# Patient Record
Sex: Female | Born: 1980 | Race: Black or African American | Hispanic: No | Marital: Single | Smoking: Current some day smoker
Health system: Southern US, Community
[De-identification: ages and names within clinical notes are randomized; demographics above are authoritative.]

## PROBLEM LIST (undated history)

## (undated) DIAGNOSIS — E039 Hypothyroidism, unspecified: Secondary | ICD-10-CM

## (undated) DIAGNOSIS — B009 Herpesviral infection, unspecified: Secondary | ICD-10-CM

## (undated) DIAGNOSIS — R87629 Unspecified abnormal cytological findings in specimens from vagina: Secondary | ICD-10-CM

## (undated) DIAGNOSIS — K859 Acute pancreatitis without necrosis or infection, unspecified: Secondary | ICD-10-CM

## (undated) DIAGNOSIS — I10 Essential (primary) hypertension: Secondary | ICD-10-CM

## (undated) DIAGNOSIS — D219 Benign neoplasm of connective and other soft tissue, unspecified: Secondary | ICD-10-CM

## (undated) DIAGNOSIS — M1990 Unspecified osteoarthritis, unspecified site: Secondary | ICD-10-CM

## (undated) HISTORY — DX: Herpesviral infection, unspecified: B00.9

## (undated) HISTORY — DX: Unspecified abnormal cytological findings in specimens from vagina: R87.629

## (undated) HISTORY — DX: Benign neoplasm of connective and other soft tissue, unspecified: D21.9

## (undated) HISTORY — PX: TONSILLECTOMY: SUR1361

## (undated) HISTORY — PX: KNEE SURGERY: SHX244

---

## 2001-12-21 ENCOUNTER — Encounter: Payer: Self-pay | Admitting: Emergency Medicine

## 2001-12-21 ENCOUNTER — Emergency Department (HOSPITAL_COMMUNITY): Admission: EM | Admit: 2001-12-21 | Discharge: 2001-12-21 | Payer: Self-pay | Admitting: Internal Medicine

## 2002-01-02 ENCOUNTER — Encounter (HOSPITAL_COMMUNITY): Admission: RE | Admit: 2002-01-02 | Discharge: 2002-02-01 | Payer: Self-pay | Admitting: Preventative Medicine

## 2002-07-03 ENCOUNTER — Emergency Department (HOSPITAL_COMMUNITY): Admission: EM | Admit: 2002-07-03 | Discharge: 2002-07-03 | Payer: Self-pay | Admitting: Emergency Medicine

## 2008-07-22 ENCOUNTER — Other Ambulatory Visit: Admission: RE | Admit: 2008-07-22 | Discharge: 2008-07-22 | Payer: Self-pay

## 2008-07-22 ENCOUNTER — Other Ambulatory Visit: Admission: RE | Admit: 2008-07-22 | Discharge: 2008-07-22 | Payer: Self-pay | Admitting: Family Medicine

## 2008-07-22 ENCOUNTER — Encounter (INDEPENDENT_AMBULATORY_CARE_PROVIDER_SITE_OTHER): Payer: Self-pay | Admitting: Unknown Physician Specialty

## 2013-07-08 ENCOUNTER — Ambulatory Visit: Payer: Self-pay | Admitting: Obstetrics and Gynecology

## 2015-12-08 ENCOUNTER — Emergency Department (HOSPITAL_COMMUNITY)
Admission: EM | Admit: 2015-12-08 | Discharge: 2015-12-08 | Disposition: A | Discharge status: Home / Self Care | Payer: Self-pay | Attending: Emergency Medicine | Admitting: Emergency Medicine

## 2015-12-08 ENCOUNTER — Encounter (HOSPITAL_COMMUNITY): Payer: Self-pay

## 2015-12-08 ENCOUNTER — Emergency Department (HOSPITAL_COMMUNITY): Payer: Self-pay

## 2015-12-08 DIAGNOSIS — Y9389 Activity, other specified: Secondary | ICD-10-CM | POA: Insufficient documentation

## 2015-12-08 DIAGNOSIS — Y990 Civilian activity done for income or pay: Secondary | ICD-10-CM | POA: Insufficient documentation

## 2015-12-08 DIAGNOSIS — X501XXA Overexertion from prolonged static or awkward postures, initial encounter: Secondary | ICD-10-CM | POA: Insufficient documentation

## 2015-12-08 DIAGNOSIS — I10 Essential (primary) hypertension: Secondary | ICD-10-CM | POA: Insufficient documentation

## 2015-12-08 DIAGNOSIS — Y9261 Building [any] under construction as the place of occurrence of the external cause: Secondary | ICD-10-CM | POA: Insufficient documentation

## 2015-12-08 DIAGNOSIS — S86812A Strain of other muscle(s) and tendon(s) at lower leg level, left leg, initial encounter: Secondary | ICD-10-CM | POA: Insufficient documentation

## 2015-12-08 DIAGNOSIS — S86912A Strain of unspecified muscle(s) and tendon(s) at lower leg level, left leg, initial encounter: Secondary | ICD-10-CM

## 2015-12-08 DIAGNOSIS — F17200 Nicotine dependence, unspecified, uncomplicated: Secondary | ICD-10-CM | POA: Insufficient documentation

## 2015-12-08 HISTORY — DX: Essential (primary) hypertension: I10

## 2015-12-08 NOTE — Discharge Instructions (Signed)
Ice, ace wrap, tylenol and motrin as needed.  If you were given medicines take as directed.  If you are on coumadin or contraceptives realize their levels and effectiveness is altered by many different medicines.  If you have any reaction (rash, tongues swelling, other) to the medicines stop taking and see a physician.    If your blood pressure was elevated in the ER make sure you follow up for management with a primary doctor or return for chest pain, shortness of breath or stroke symptoms.  Please follow up as directed and return to the ER or see a physician for new or worsening symptoms.  Thank you. Vitals:   12/08/15 1001 12/08/15 1003  BP:  (!) 162/114  Pulse:  62  Resp:  20  Temp:  98.7 F (37.1 C)  TempSrc:  Oral  SpO2:  100%  Weight: 221 lb (100.2 kg)   Height: 5\' 1"  (1.549 m)

## 2015-12-08 NOTE — ED Triage Notes (Signed)
Pt reports left knee started hurting while loading a machine at work on Sat.  Reports history of torn acl in left knee.

## 2015-12-08 NOTE — ED Provider Notes (Signed)
[facility]EMER[facility]ENCY DEPT Provider Note   CSN: [id number] Arrival date & time: 12/08/15  [id number]  By signing my name below, I, Soijett Blue, attest that this documentation has been prepared under the direction and in the presence of Blane OharaJoshua Anavi Branscum, MD. Electronically Signed: Soijett Blue, ED Scribe. 12/08/15. 10:43 AM.   History   Chief Complaint Chief Complaint  Patient presents with  . Knee Pain    HPI Renee [name] is a 35 y.o. female with a medical hx of HTN who presents to the Emergency Department complaining of left knee pain onset 3 days ago. 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 Pt reports that she was unable to straighten her leg initially, but she is able to do so now. Pt states that she tore her ACL when she was 35 years old and she had the area reconstructed. Pt denies having an orthopedist at this time. Pt is having associated symptoms of gait problem due to pain. She notes that she has tried ibuprofen for the relief of her symptoms. She denies color change, wound, rash, swelling, and any other symptoms.     The history is provided by the patient. No language interpreter was used.  Knee Pain   This is a new problem. The current episode started more than 2 days ago. The problem occurs rarely. The problem has not changed since onset.The pain is present in the left knee. The pain is mild. Pertinent negatives include full range of motion. The symptoms are aggravated by activity. Treatments tried: ibuprofen. The treatment provided mild relief. There has been a history of trauma (torn ACL at 35 years old with surgical repair).    Past Medical History:  Diagnosis Date  . Hypertension     There are no active problems to display for this patient.   Past Surgical History:  Procedure Laterality Date  . KNEE SURGERY      OB History    No data available       Home Medications    Prior to  Admission medications   Not on File    Family History No family history on file.  Social History Social History  Substance Use Topics  . Smoking status: Current Every Day Smoker  . Smokeless tobacco: Never Used  . Alcohol use No     Allergies   Review of patient's allergies indicates no known allergies.   Review of Systems Review of Systems  Musculoskeletal: Positive for arthralgias (left knee) and gait problem (due to pain). Negative for joint swelling.  Skin: Negative for color change, rash and wound.  All other systems reviewed and are negative.    Physical Exam Updated Vital Signs BP (!) 162/114 (BP Location: Left Arm)   Pulse 62   Temp 98.7 F (37.1 C) (Oral)   Resp 20   Ht 5\' 1"  (1.549 m)   Wt 221 lb (100.2 kg)   LMP 11/17/2015   SpO2 100%   BMI 41.76 kg/m   Physical Exam  Constitutional: She is oriented to person, place, and time. She appears well-developed and well-nourished. No distress.  HENT:  Head: Normocephalic and atraumatic.  Eyes: EOM are normal.  Neck: Neck supple.  Cardiovascular: Normal rate.   Pulmonary/Chest: Effort normal. No respiratory distress.  Abdominal: She exhibits no distension.  Musculoskeletal: Normal range of motion.       Left knee: She exhibits normal range of motion, no  effusion, no LCL laxity and no MCL laxity. Tenderness found.  Discomfort with flexion and extension, however FROM with flexion and extension. Mild tenderness at the left joint line. No significant effusion. No specific pain or laxity with varus or valgus strain. No laxity with drawer testing.  Neurological: She is alert and oriented to person, place, and time.  Skin: Skin is warm and dry.  Psychiatric: She has a normal mood and affect. Her behavior is normal.  Nursing note and vitals reviewed.    ED Treatments / Results  DIAGNOSTIC STUDIES: Oxygen Saturation is 100% on RA, nl by my interpretation.    COORDINATION OF CARE: 10:40 AM Discussed treatment  plan with pt at bedside which includes left knee xray, referral and follow up with orthopedist, and pt agreed to plan.  Radiology Dg Knee Complete 4 Views Left  Result Date: 12/08/2015 CLINICAL DATA:  POSTERIOR LEFT KNEE PAIN S/P KNEE LOCKING UP IN 90 DEGREE ANGLE ON Saturday, PAIN IS WORSE WITH STANDING, HX OF ACL REPAIR 18 YEARS AGO, NO RECENT INJURY EXAM: LEFT KNEE - COMPLETE 4+ VIEW COMPARISON:  None. FINDINGS: No fracture.  No bone lesion. There changes from a previous ACL reconstruction with distal femur and proximal tibial interference screws. There are moderate changes of osteoarthritis with medial greater than patellofemoral joint space compartment narrowing and prominent tricompartmental marginal osteophytes. No joint effusion. Soft tissues are unremarkable. IMPRESSION: 1. No fracture or acute finding. 2. Moderate osteoarthritis. 3. Changes from previous ACL reconstruction. Electronically Signed   By: Amie Portland M.D.   On: 12/08/2015 10:36    Procedures Procedures (including critical care time)  Medications Ordered in ED Medications - No data to display   Initial Impression / Assessment and Plan / ED Course  I have reviewed the triage vital signs and the nursing notes.  Pertinent imaging results that were available during my care of the patient were reviewed by me and considered in my medical decision making (see chart for details).  Clinical Course   Patient presents with left knee strain. History of ligamentous injury. No obvious laxity on exam no sign of infection. Outpatient follow-up with orthopedics.  Final Clinical Impressions(s) / ED Diagnoses   Final diagnoses:  Knee strain, left, initial encounter    New Prescriptions New Prescriptions   No medications on file   1}    Blane Ohara, MD 12/08/15 1051

## 2015-12-08 NOTE — ED Notes (Signed)
Pt taken to XR.  

## 2015-12-09 ENCOUNTER — Ambulatory Visit: Payer: Self-pay | Admitting: Orthopaedic Surgery

## 2015-12-10 ENCOUNTER — Encounter: Payer: Self-pay | Admitting: Orthopaedic Surgery

## 2015-12-10 ENCOUNTER — Ambulatory Visit (INDEPENDENT_AMBULATORY_CARE_PROVIDER_SITE_OTHER): Payer: PRIVATE HEALTH INSURANCE | Admitting: Orthopaedic Surgery

## 2015-12-10 VITALS — BP 144/92 | HR 61 | Temp 97.7°F | Ht 62.0 in | Wt 222.0 lb

## 2015-12-10 DIAGNOSIS — Z720 Tobacco use: Secondary | ICD-10-CM

## 2015-12-10 DIAGNOSIS — M2392 Unspecified internal derangement of left knee: Secondary | ICD-10-CM | POA: Diagnosis not present

## 2015-12-10 DIAGNOSIS — M25562 Pain in left knee: Secondary | ICD-10-CM

## 2015-12-10 DIAGNOSIS — F17200 Nicotine dependence, unspecified, uncomplicated: Secondary | ICD-10-CM

## 2015-12-10 NOTE — Patient Instructions (Signed)
Out of work note for today.

## 2015-12-10 NOTE — Progress Notes (Signed)
Subjective: My left knee locks up    Patient ID: Renee [name], female    DOB: [birth year], 35 y.o.   MRN: [id number]  HPI  She has had increasing pain of the left knee over the last few weeks with locking and giving way beginning around 12-04-15.  She went to the ER on 12-08-15.  I have reviewed the ER report, x-rays and x-ray reports.  She has had in 1998 ACL repair on the left side.  She has done well until recently.  She denies any new trauma.  She has swelling and popping.  She has no numbness.  X-rays showed: IMPRESSION: 1. No fracture or acute finding. 2. Moderate osteoarthritis. 3. Changes from previous ACL reconstruction.  She is concerned about the locking and giving way of the left knee.  I am as well.  I will get MRI of the knee.  I am concerned about loose body or even change in ACL graft.  Review of Systems  HENT: Negative for congestion.   Respiratory: Negative for cough and shortness of breath.   Cardiovascular: Negative for chest pain and leg swelling.  Endocrine: Negative for cold intolerance.  Musculoskeletal: Positive for arthralgias, gait problem and joint swelling.  Allergic/Immunologic: Negative for environmental allergies.   Past Medical History:  Diagnosis Date  . Hypertension     Past Surgical History:  Procedure Laterality Date  . KNEE SURGERY      No current outpatient prescriptions on file prior to visit.   No current facility-administered medications on file prior to visit.     Social History   Social History  . Marital status: Single    Spouse name: N/A  . Number of children: N/A  . Years of education: N/A   Occupational History  . Not on file.   Social History Main Topics  . Smoking status: Current Every Day Smoker  . Smokeless tobacco: Never Used  . Alcohol use No  . Drug use: No  . Sexual activity: Not on file   Other Topics Concern  . Not on file   Social History Narrative  . No narrative on file    Diabetes, heart  disease, hypertension runs in the family as well as GERD.  BP (!) 144/92   Pulse 61   Temp 97.7 F (36.5 C)   Ht 5\' 2"  (1.575 m)   Wt 222 lb (100.7 kg)   LMP 11/17/2015   BMI 40.60 kg/m      Objective:   Physical Exam  Constitutional: She is oriented to person, place, and time. She appears well-developed and well-nourished.  HENT:  Head: Normocephalic and atraumatic.  Eyes: Conjunctivae and EOM are normal. Pupils are equal, round, and reactive to light.  Neck: Normal range of motion. Neck supple.  Cardiovascular: Normal rate, regular rhythm and intact distal pulses.   Pulmonary/Chest: Effort normal.  Abdominal: Soft.  Musculoskeletal: She exhibits tenderness (Pain left knee, ROM 0-105, crepitus, well healed anterior scars, positive medial McMurray and medial joint line pain, effusion, limp.  Right knee negative.  1/2+ drawer sign left knee.).  Neurological: She is alert and oriented to person, place, and time. She displays normal reflexes. No cranial nerve deficit. She exhibits normal muscle tone. Coordination normal.  Skin: Skin is warm and dry.  Psychiatric: She has a normal mood and affect. Her behavior is normal. Judgment and thought content normal.    She smokes and I have spoken to her about cutting back or quitting smoking.  She will consider.      Assessment & Plan:   Encounter Diagnoses  Name Primary?  . Left knee pain Yes  . Locking of left knee   . Tobacco smoker within last 12 months    Get MRI of the left knee.  Return after MRI.  Call if any problem.  Precautions discussed.  Electronically Signed Darreld McleanWayne Dorette Hartel, MD 8/31/20171:41 PM

## 2015-12-11 ENCOUNTER — Ambulatory Visit (HOSPITAL_COMMUNITY)
Admission: RE | Admit: 2015-12-11 | Discharge: 2015-12-11 | Disposition: A | Discharge status: Home / Self Care | Payer: PRIVATE HEALTH INSURANCE | Source: Ambulatory Visit | Attending: Orthopaedic Surgery | Admitting: Orthopaedic Surgery

## 2015-12-11 DIAGNOSIS — Z9889 Other specified postprocedural states: Secondary | ICD-10-CM | POA: Diagnosis not present

## 2015-12-11 DIAGNOSIS — M25562 Pain in left knee: Secondary | ICD-10-CM

## 2015-12-11 DIAGNOSIS — M2392 Unspecified internal derangement of left knee: Secondary | ICD-10-CM | POA: Diagnosis not present

## 2015-12-11 DIAGNOSIS — M1712 Unilateral primary osteoarthritis, left knee: Secondary | ICD-10-CM | POA: Insufficient documentation

## 2015-12-15 ENCOUNTER — Ambulatory Visit (INDEPENDENT_AMBULATORY_CARE_PROVIDER_SITE_OTHER): Payer: PRIVATE HEALTH INSURANCE | Admitting: Orthopaedic Surgery

## 2015-12-15 ENCOUNTER — Encounter: Payer: Self-pay | Admitting: Orthopaedic Surgery

## 2015-12-15 VITALS — BP 152/89 | HR 72 | Temp 97.7°F | Ht 61.0 in | Wt 226.0 lb

## 2015-12-15 DIAGNOSIS — Z720 Tobacco use: Secondary | ICD-10-CM | POA: Diagnosis not present

## 2015-12-15 DIAGNOSIS — M25562 Pain in left knee: Secondary | ICD-10-CM

## 2015-12-15 DIAGNOSIS — F17200 Nicotine dependence, unspecified, uncomplicated: Secondary | ICD-10-CM

## 2015-12-15 MED ORDER — HYDROCODONE-ACETAMINOPHEN 5-325 MG PO TABS: 1.0000 | ORAL_TABLET | ORAL | 0 refills | Status: DC | PRN

## 2015-12-15 NOTE — Progress Notes (Signed)
Patient WG:NFAOZH:Alexia Docia FurlK Carriero, female DOB:[birth year], 35 y.o. YQM:[id number]  Chief Complaint  Patient presents with  . Results    MRI Left Knee    HPI  Koralynn K Roney MarionFoust is a 35 y.o. female who has left knee pain.  She had a MRI done on the left knee and it shows: IMPRESSION: Status post meniscectomy posterior horn and body of the medial meniscus. The posterior horn is severely degenerated and diminutive and has a markedly irregular appearance without focal tear identified.  Status post ACL grafting. The graft is intact and appears mildly degenerated. Associated small ACL ganglion is identified.  Markedly advanced for age tricompartmental osteoarthritis appears worst medially and is predominantly evidenced by bulky Osteophytosis.  I have explained the findings.  She is wearing out her knee but does not need surgery at this point in time.  HPI  Body mass index is 42.7 kg/m.  ROS  Review of Systems  HENT: Negative for congestion.   Respiratory: Negative for cough and shortness of breath.   Cardiovascular: Negative for chest pain and leg swelling.  Endocrine: Negative for cold intolerance.  Musculoskeletal: Positive for arthralgias, gait problem and joint swelling.  Allergic/Immunologic: Negative for environmental allergies.    Past Medical History:  Diagnosis Date  . Hypertension     Past Surgical History:  Procedure Laterality Date  . KNEE SURGERY      History reviewed. No pertinent family history.  Social History Social History  Substance Use Topics  . Smoking status: Current Every Day Smoker  . Smokeless tobacco: Never Used  . Alcohol use No    No Known Allergies  Current Outpatient Prescriptions  Medication Sig Dispense Refill  . HYDROcodone-acetaminophen (NORCO/VICODIN) 5-325 MG tablet Take 1 tablet by mouth every 4 (four) hours as needed for moderate pain (Must last 14 days.Do not take and drive a car or use machinery.). 56 tablet 0   No current  facility-administered medications for this visit.      Physical Exam  Blood pressure (!) 152/89, pulse 72, temperature 97.7 F (36.5 C), height 5\' 1"  (1.549 m), weight 226 lb (102.5 kg), last menstrual period 11/17/2015.  Constitutional: overall normal hygiene, normal nutrition, well developed, normal grooming, normal body habitus. Assistive device:none  Musculoskeletal: gait and station Limp left, muscle tone and strength are normal, no tremors or atrophy is present.  .  Neurological: coordination overall normal.  Deep tendon reflex/nerve stretch intact.  Sensation normal.  Cranial nerves II-XII intact.   Skin:   normal overall no scars, lesions, ulcers or rashes. No psoriasis.  Psychiatric: Alert and oriented x 3.  Recent memory intact, remote memory unclear.  Normal mood and affect. Well groomed.  Good eye contact.  Cardiovascular: overall no swelling, no varicosities, no edema bilaterally, normal temperatures of the legs and arms, no clubbing, cyanosis and good capillary refill.  Lymphatic: palpation is normal.  The left lower extremity is examined:  Inspection:  Thigh:  Non-tender and no defects  Knee has swelling 1+ effusion.                        Joint tenderness is present                        Patient is tender over the medial joint line  Lower Leg:  Has normal appearance and no tenderness or defects  Ankle:  Non-tender and no defects  Foot:  Non-tender and no defects Range  of Motion:  Knee:  Range of motion is: 0-105                        Crepitus is  present  Ankle:  Range of motion is normal. Strength and Tone:  The left lower extremity has normal strength and tone. Stability:  Knee:  The knee is stable.  Ankle:  The ankle is stable.    The patient has been educated about the nature of the problem(s) and counseled on treatment options.  The patient appeared to understand what I have discussed and is in agreement with it.  Encounter Diagnoses  Name  Primary?  . Left knee pain Yes  . Tobacco smoker within last 12 months     PLAN Call if any problems.  Precautions discussed.  Continue current medications.   Return to clinic 6 weeks   Electronically Signed Darreld Mclean, MD 9/5/20172:43 PM

## 2015-12-15 NOTE — Patient Instructions (Addendum)

## 2016-01-13 ENCOUNTER — Encounter: Payer: Self-pay | Admitting: Orthopaedic Surgery

## 2016-01-26 ENCOUNTER — Ambulatory Visit: Payer: PRIVATE HEALTH INSURANCE | Admitting: Orthopaedic Surgery

## 2016-01-26 ENCOUNTER — Encounter: Payer: Self-pay | Admitting: Orthopaedic Surgery

## 2016-05-31 ENCOUNTER — Other Ambulatory Visit (HOSPITAL_COMMUNITY)
Admission: RE | Admit: 2016-05-31 | Discharge: 2016-05-31 | Disposition: A | Discharge status: Home / Self Care | Payer: BLUE CROSS/BLUE SHIELD | Source: Ambulatory Visit | Attending: Adult Health | Admitting: Adult Health

## 2016-05-31 ENCOUNTER — Encounter: Payer: Self-pay | Admitting: Adult Health

## 2016-05-31 ENCOUNTER — Ambulatory Visit (INDEPENDENT_AMBULATORY_CARE_PROVIDER_SITE_OTHER): Payer: BLUE CROSS/BLUE SHIELD | Admitting: Adult Health

## 2016-05-31 VITALS — BP 132/92 | HR 80 | Ht 61.0 in | Wt 213.5 lb

## 2016-05-31 DIAGNOSIS — Z01419 Encounter for gynecological examination (general) (routine) without abnormal findings: Secondary | ICD-10-CM | POA: Insufficient documentation

## 2016-05-31 DIAGNOSIS — Z113 Encounter for screening for infections with a predominantly sexual mode of transmission: Secondary | ICD-10-CM | POA: Insufficient documentation

## 2016-05-31 DIAGNOSIS — Z1151 Encounter for screening for human papillomavirus (HPV): Secondary | ICD-10-CM | POA: Diagnosis not present

## 2016-05-31 NOTE — Patient Instructions (Signed)
Physical in 1 year Pap in 3 if normal Mammogram at 40 Labs at PCP

## 2016-05-31 NOTE — Progress Notes (Signed)
Patient ID: Renee LevinsRenate K [name], female   DOB: 07-Aug-1980, 36 y.o.   MRN: [id number] History of Present Illness:  Renee [name] is a 36 year old black female in for a well woman gyn exam and pap.She is a new pt. [facility], and she had labs and flu shot and Tdap there 05/04/16. 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  Current Medications, Allergies, Past Medical History, Past Surgical History, Family History and Social History were reviewed in [facility] electronic medical record.     Review of Systems: Patient denies any headaches, hearing loss, fatigue, blurred vision, shortness of breath, chest pain, abdominal pain, problems with bowel movements, urination, or intercourse. No joint pain or mood swings.Periods last about 4 days and come about every 24 days.She does not use birth control and has been with same partner for 10 years.She does smoke and I encouraged to decrease that, she has hypertension and is on norvasc from PCP.    Physical Exam:BP (!) 132/92 (BP Location: Right Arm, Patient Position: Sitting, Cuff Size: Large)   Pulse 80   Ht 5\' 1"  (1.549 m)   Wt 213 lb 8 oz (96.8 kg)   LMP 05/24/2016 (Exact Date)   BMI 40.34 kg/m  General:  Well developed, well nourished, no acute distress Skin:  Warm and dry Neck:  Midline trachea, normal thyroid, good ROM, no lymphadenopathy Lungs; Clear to auscultation bilaterally Breast:  No dominant palpable mass, retraction, or nipple discharge Cardiovascular: Regular rate and rhythm Abdomen:  Soft, non tender, no hepatosplenomegaly Pelvic:  External genitalia is normal in appearance, no lesions.  The vagina is normal in appearance. Urethra has no lesions or masses. The cervix is smooth, pap with GC/CHL and HPV perforemd.  Uterus is felt to be normal size, shape, and contour.  No adnexal masses or tenderness noted.Bladder is non tender, no masses felt. 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   Impression: 1. Encounter for gynecological examination with Papanicolaou smear of cervix       Plan: Physical in 1 year Pap in 3 if normal Mammogram at 40 Labs at PCP and F/U with him for BP

## 2016-06-03 LAB — CYTOLOGY - PAP
Chlamydia: NEGATIVE
DIAGNOSIS: NEGATIVE
HPV: NOT DETECTED
NEISSERIA GONORRHEA: NEGATIVE

## 2018-11-24 ENCOUNTER — Encounter (HOSPITAL_COMMUNITY): Payer: Self-pay | Admitting: Emergency Medicine

## 2018-11-24 ENCOUNTER — Emergency Department (HOSPITAL_COMMUNITY): Payer: BLUE CROSS/BLUE SHIELD

## 2018-11-24 ENCOUNTER — Other Ambulatory Visit: Payer: Self-pay

## 2018-11-24 ENCOUNTER — Emergency Department (HOSPITAL_COMMUNITY)
Admission: EM | Admit: 2018-11-24 | Discharge: 2018-11-24 | Disposition: A | Discharge status: Home / Self Care | Payer: BLUE CROSS/BLUE SHIELD | Attending: Emergency Medicine | Admitting: Emergency Medicine

## 2018-11-24 DIAGNOSIS — I10 Essential (primary) hypertension: Secondary | ICD-10-CM | POA: Insufficient documentation

## 2018-11-24 DIAGNOSIS — F17210 Nicotine dependence, cigarettes, uncomplicated: Secondary | ICD-10-CM | POA: Diagnosis not present

## 2018-11-24 DIAGNOSIS — R0602 Shortness of breath: Secondary | ICD-10-CM | POA: Insufficient documentation

## 2018-11-24 DIAGNOSIS — R0789 Other chest pain: Secondary | ICD-10-CM | POA: Diagnosis not present

## 2018-11-24 DIAGNOSIS — Z79899 Other long term (current) drug therapy: Secondary | ICD-10-CM | POA: Diagnosis not present

## 2018-11-24 DIAGNOSIS — R079 Chest pain, unspecified: Secondary | ICD-10-CM

## 2018-11-24 LAB — URINALYSIS, ROUTINE W REFLEX MICROSCOPIC
Bilirubin Urine: NEGATIVE
Glucose, UA: NEGATIVE mg/dL
Hgb urine dipstick: NEGATIVE
Ketones, ur: NEGATIVE mg/dL
Leukocytes,Ua: NEGATIVE
Nitrite: NEGATIVE
Protein, ur: NEGATIVE mg/dL
Specific Gravity, Urine: 1.013 (ref 1.005–1.030)
pH: 5 (ref 5.0–8.0)

## 2018-11-24 LAB — BASIC METABOLIC PANEL
Anion gap: 8 (ref 5–15)
BUN: 16 mg/dL (ref 6–20)
CO2: 24 mmol/L (ref 22–32)
Calcium: 12.3 mg/dL — ABNORMAL HIGH (ref 8.9–10.3)
Chloride: 105 mmol/L (ref 98–111)
Creatinine, Ser: 1.07 mg/dL — ABNORMAL HIGH (ref 0.44–1.00)
GFR calc Af Amer: >60 mL/min (ref >60)
GFR calc non Af Amer: >60 mL/min (ref >60)
Glucose, Bld: 100 mg/dL — ABNORMAL HIGH (ref 70–99)
Potassium: 3.8 mmol/L (ref 3.5–5.1)
Sodium: 137 mmol/L (ref 135–145)

## 2018-11-24 LAB — CBC
HCT: 38.7 % (ref 36.0–46.0)
Hemoglobin: 12.2 g/dL (ref 12.0–15.0)
MCH: 26.3 pg (ref 26.0–34.0)
MCHC: 31.5 g/dL (ref 30.0–36.0)
MCV: 83.4 fL (ref 80.0–100.0)
Platelets: 304 10*3/uL (ref 150–400)
RBC: 4.64 MIL/uL (ref 3.87–5.11)
RDW: 14 % (ref 11.5–15.5)
WBC: 7.5 10*3/uL (ref 4.0–10.5)
nRBC: 0 % (ref 0.0–0.2)

## 2018-11-24 LAB — POC URINE PREG, ED: Preg Test, Ur: NEGATIVE

## 2018-11-24 LAB — TROPONIN I (HIGH SENSITIVITY)
Troponin I (High Sensitivity): 3 ng/L (ref <18)
Troponin I (High Sensitivity): 3 ng/L (ref <18)

## 2018-11-24 MED ORDER — LISINOPRIL 10 MG PO TABS: 10.0000 mg | ORAL_TABLET | Freq: Once | ORAL | Status: DC

## 2018-11-24 MED ORDER — NITROGLYCERIN 0.4 MG SL SUBL
0.4000 mg | SUBLINGUAL_TABLET | SUBLINGUAL | Status: DC | PRN
  Administered 2018-11-24 (×2): 0.4 mg via SUBLINGUAL
  Filled 2018-11-24 (×2): qty 1

## 2018-11-24 MED ORDER — LABETALOL HCL 5 MG/ML IV SOLN: 10.0000 mg | Freq: Once | INTRAVENOUS | Status: DC

## 2018-11-24 MED ORDER — LISINOPRIL 10 MG PO TABS: 10.0000 mg | ORAL_TABLET | Freq: Every day | ORAL | 0 refills | Status: DC

## 2018-11-24 MED ORDER — ASPIRIN 81 MG PO CHEW
324.0000 mg | CHEWABLE_TABLET | Freq: Once | ORAL | Status: AC
  Administered 2018-11-24: 324 mg via ORAL
  Filled 2018-11-24: qty 4

## 2018-11-24 NOTE — ED Notes (Signed)
Educated pt twice during visit the importance of keeping her BP under control.  Pt to go directly to Pharmacy to fill and take Lisinopril as ordered.  Pt verbal says she will take medication immediately upon leaving ED.

## 2018-11-24 NOTE — ED Notes (Signed)
Patient transported to X-ray 

## 2018-11-24 NOTE — Discharge Instructions (Addendum)
Your lab tests, EKG and chest x-ray are all reassuring today that your symptoms were not associated with a cardiac event.  However your blood pressure has remained extremely elevated while you were here and this can cause profound ongoing complications including heart disease if you did not get better control of your blood pressure and continue taking your medications.  I have prescribed you a starting dose of lisinopril to take daily.  You have had your first dose today, start taking this prescription tomorrow morning.  I have given you some referrals above as suggestions for obtaining a primary doctor.  I have also given you a referral to Dr. Bronson Ing who is our local cardiologist.  Please call on Monday for an appointment for further evaluation of this chest pain episode.

## 2018-11-24 NOTE — ED Provider Notes (Signed)
[facility] EMERGENCY DEPARTMENT Provider Note   CSN: [id number] Arrival date & time: 11/24/18  [id number]    History   Chief Complaint Chief Complaint  Patient presents with  . Chest Pain    HPI Renee [name] is a 38 y.o. female with a history of untreated HTN ( reports ran out about 3 to 4 years ago when she lost her medical insurance) presenting with a 3 hour history of epigastric  pain described as pressure which radiates to left chest and upper back region, occurring at rest and associated with nausea without emesis and diaphoresis.  She reports occasional similar episodes in the past that have lasted as long as 45 minutes, then resolves spontaneously.  She states sometimes these episodes have been improved with Tums and ambulation, other times ambulation seemed to make it worse.  She took tums initially today which did not relieve her sx.  She still endorses vague pressure but improved and took a Tums prior to arrival.  No family cardiac hx at an early age, smoker, obesity, HTN as risk factors.  HPI: A 38 year old patient with a history of hypertension and obesity presents for evaluation of chest pain. Initial onset of pain was approximately 3-6 hours ago. The patient's chest pain is described as heaviness/pressure/tightness and is worse with exertion. The patient complains of nausea and reports some diaphoresis. The patient's chest pain is middle- or left-sided, is not well-localized, is not sharp and does not radiate to the arms/jaw/neck. The patient has smoked in the past 90 days. The patient has no history of stroke, has no history of peripheral artery disease, denies any history of treated diabetes, has no relevant family history of coronary artery disease (first degree relative at less than age 38) and has no history of hypercholesterolemia.   The history is provided by the patient.    Past Medical History:  Diagnosis Date  . Herpes simplex virus (HSV) infection    no OB +HSV 05/04/16  at [facility]   . Hypertension   . Vaginal Pap smear, abnormal     Patient Active Problem List   Diagnosis Date Noted  . 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 KNEE SURGERY       OB History    Gravida  2   Para      Term      Preterm      AB  2   Living        SAB      TAB      Ectopic      Multiple      Live Births               Home Medications    Prior to Admission medications   Medication Sig Start Date End Date Taking? Authorizing Provider  lisinopril (ZESTRIL) 10 MG tablet Take 1 tablet (10 mg total) by mouth daily. 11/24/18   Burgess AmorIdol, Jonas Goh, PA-C    Family History Family History  Problem Relation Age of Onset  . Congestive Heart Failure Maternal Grandmother   . Hypertension Maternal Grandmother   . Diabetes Maternal Grandmother   . Other Maternal Grandfather        aneursym  . Kidney disease Maternal Grandfather        on dialysis  . Congestive Heart Failure Maternal Grandfather   . Diabetes Maternal Grandfather     Social History Social History   Tobacco  Use  . Smoking status: Current Every Day Smoker    Years: 17.00    Types: Cigarettes  . Smokeless tobacco: Never Used  . Tobacco comment: smokes 2-3 cig daily  Substance Use Topics  . Alcohol use: Yes    Comment: socially  . Drug use: No     Allergies   Patient has no known allergies.   Review of Systems Review of Systems  Constitutional: Positive for diaphoresis. Negative for fever.  HENT: Negative for congestion and sore throat.   Eyes: Negative.   Respiratory: Positive for chest tightness and shortness of breath.   Cardiovascular: Positive for chest pain. Negative for palpitations and leg swelling.  Gastrointestinal: Positive for nausea. Negative for abdominal pain and vomiting.  Genitourinary: Negative.   Musculoskeletal: Negative for arthralgias, joint swelling and neck pain.  Skin: Negative.  Negative for rash and  wound.  Neurological: Negative for dizziness, weakness, light-headedness, numbness and headaches.  Psychiatric/Behavioral: Negative.      Physical Exam Updated Vital Signs BP (!) 166/111   Pulse 83   Temp 98.1 F (36.7 C) (Oral)   Resp 20   Ht 5\' 1"  (1.549 m)   Wt 107.5 kg   LMP 11/10/2018 (Exact Date) Comment: negative preg test   SpO2 98%   BMI 44.78 kg/m   Physical Exam Vitals signs and nursing note reviewed.  Constitutional:      General: She is not in acute distress.    Appearance: She is well-developed. She is not diaphoretic.  HENT:     Head: Normocephalic and atraumatic.  Eyes:     Conjunctiva/sclera: Conjunctivae normal.  Neck:     Musculoskeletal: Normal range of motion.  Cardiovascular:     Rate and Rhythm: Normal rate and regular rhythm.     Heart sounds: Normal heart sounds.  Pulmonary:     Effort: Pulmonary effort is normal.     Breath sounds: Normal breath sounds. No wheezing, rhonchi or rales.  Abdominal:     General: Bowel sounds are normal.     Palpations: Abdomen is soft.     Tenderness: There is no abdominal tenderness. There is no guarding.  Musculoskeletal: Normal range of motion.     Right lower leg: She exhibits no tenderness. No edema.     Left lower leg: She exhibits no tenderness. No edema.  Skin:    General: Skin is warm and dry.  Neurological:     General: No focal deficit present.     Mental Status: She is alert.      ED Treatments / Results  Labs (all labs ordered are listed, but only abnormal results are displayed) Labs Reviewed  BASIC METABOLIC PANEL - Abnormal; Notable for the following components:      Result Value   Glucose, Bld 100 (*)    Creatinine, Ser 1.07 (*)    Calcium 12.3 (*)    All other components within normal limits  CBC  URINALYSIS, ROUTINE W REFLEX MICROSCOPIC  POC URINE PREG, ED  TROPONIN I (HIGH SENSITIVITY)  TROPONIN I (HIGH SENSITIVITY)    EKG EKG Interpretation  Date/Time:  Saturday November 24 2018 09:33:37 EDT Ventricular Rate:  85 PR Interval:    QRS Duration: 82 QT Interval:  338 QTC Calculation: 402 R Axis:   40 Text Interpretation:  Sinus rhythm Prolonged PR interval Interpretation limited secondary to artifact Confirmed by Fredia Sorrow [phone]) on 11/24/2018 9:41:11 AM   Radiology Dg Chest 2 View  Result Date: 11/24/2018 CLINICAL  DATA:  Chest pain EXAM: CHEST - 2 VIEW COMPARISON:  None. FINDINGS: Normal heart size and mediastinal contours. No acute infiltrate or edema. No effusion or pneumothorax. No acute osseous findings. Artifact from EKG leads. IMPRESSION: No active cardiopulmonary disease. Electronically Signed   By: Marnee SpringJonathon  Watts M.D.   On: 11/24/2018 10:55    Procedures Procedures (including critical care time)  Medications Ordered in ED Medications  nitroGLYCERIN (NITROSTAT) SL tablet 0.4 mg (0.4 mg Sublingual Given 11/24/18 1236)  aspirin chewable tablet 324 mg (324 mg Oral Given 11/24/18 1044)     Initial Impression / Assessment and Plan / ED Course  I have reviewed the triage vital signs and the nursing notes.  Pertinent labs & imaging results that were available during my care of the patient were reviewed by me and considered in my medical decision making (see chart for details).     HEAR Score: 4  Patient with a heart score of 4, serial troponins are both stable at 3 ng/L.  EKG and chest x-ray other labs also reviewed.  Per heart score of 4 and negative serial troponins patient can be safely discharged home.  We discussed the importance of blood pressure control and not running out of her medications.  Also discussed dietary changes/DASH diet for improvement in blood pressure control.  She is motivated to establish care with a primary doctor and she was given referrals for this.  She was started on lisinopril 10 mg daily.  She was also given a referral to cardiology and was encouraged to call on Monday for an appointment time to undergo cardiology  evaluation and consideration for stress testing.  Patient understands and is agreeable to do this.  Return precautions were outlined.   Final Clinical Impressions(s) / ED Diagnoses   Final diagnoses:  Nonspecific chest pain  Hypertension, unspecified type    ED Discharge Orders         Ordered    lisinopril (ZESTRIL) 10 MG tablet  Daily     11/24/18 1359           Burgess Amordol, Enola Siebers, PA-C 11/24/18 1434    Vanetta MuldersZackowski, Scott, MD 11/25/18 1549

## 2018-11-24 NOTE — ED Notes (Signed)
Patient is resting comfortably. 

## 2018-11-24 NOTE — ED Triage Notes (Signed)
C/o chest pain started this am.  BP checked at home by family member and BP 202/122.  C/o chest pain epigastric, under left breast and mid back.  C/o SOB when walking.

## 2019-10-05 ENCOUNTER — Other Ambulatory Visit (HOSPITAL_COMMUNITY): Payer: Self-pay | Admitting: Internal Medicine

## 2019-10-07 ENCOUNTER — Other Ambulatory Visit (HOSPITAL_COMMUNITY): Payer: Self-pay | Admitting: Internal Medicine

## 2019-10-07 ENCOUNTER — Other Ambulatory Visit: Payer: Self-pay | Admitting: Internal Medicine

## 2019-10-07 DIAGNOSIS — E049 Nontoxic goiter, unspecified: Secondary | ICD-10-CM

## 2019-10-07 DIAGNOSIS — Z1389 Encounter for screening for other disorder: Secondary | ICD-10-CM

## 2020-01-10 DIAGNOSIS — I10 Essential (primary) hypertension: Secondary | ICD-10-CM | POA: Diagnosis not present

## 2020-01-10 DIAGNOSIS — E210 Primary hyperparathyroidism: Secondary | ICD-10-CM | POA: Diagnosis not present

## 2020-01-13 ENCOUNTER — Other Ambulatory Visit: Payer: Self-pay

## 2020-01-13 ENCOUNTER — Emergency Department (HOSPITAL_COMMUNITY): Payer: PRIVATE HEALTH INSURANCE

## 2020-01-13 ENCOUNTER — Emergency Department (HOSPITAL_COMMUNITY)
Admission: EM | Admit: 2020-01-13 | Discharge: 2020-01-13 | Disposition: A | Discharge status: Home / Self Care | Payer: PRIVATE HEALTH INSURANCE | Attending: Emergency Medicine | Admitting: Emergency Medicine

## 2020-01-13 DIAGNOSIS — R079 Chest pain, unspecified: Secondary | ICD-10-CM

## 2020-01-13 DIAGNOSIS — F17210 Nicotine dependence, cigarettes, uncomplicated: Secondary | ICD-10-CM | POA: Diagnosis not present

## 2020-01-13 DIAGNOSIS — R0789 Other chest pain: Secondary | ICD-10-CM | POA: Insufficient documentation

## 2020-01-13 DIAGNOSIS — I10 Essential (primary) hypertension: Secondary | ICD-10-CM | POA: Diagnosis not present

## 2020-01-13 LAB — BASIC METABOLIC PANEL
Anion gap: 6 (ref 5–15)
BUN: 16 mg/dL (ref 6–20)
CO2: 22 mmol/L (ref 22–32)
Calcium: 10.9 mg/dL — ABNORMAL HIGH (ref 8.9–10.3)
Chloride: 107 mmol/L (ref 98–111)
Creatinine, Ser: 0.76 mg/dL (ref 0.44–1.00)
GFR calc Af Amer: >60 mL/min (ref >60)
GFR calc non Af Amer: >60 mL/min (ref >60)
Glucose, Bld: 101 mg/dL — ABNORMAL HIGH (ref 70–99)
Potassium: 3.8 mmol/L (ref 3.5–5.1)
Sodium: 135 mmol/L (ref 135–145)

## 2020-01-13 LAB — CBC
HCT: 41.2 % (ref 36.0–46.0)
Hemoglobin: 13.6 g/dL (ref 12.0–15.0)
MCH: 28.8 pg (ref 26.0–34.0)
MCHC: 33 g/dL (ref 30.0–36.0)
MCV: 87.1 fL (ref 80.0–100.0)
Platelets: 288 10*3/uL (ref 150–400)
RBC: 4.73 MIL/uL (ref 3.87–5.11)
RDW: 13.2 % (ref 11.5–15.5)
WBC: 15.7 10*3/uL — ABNORMAL HIGH (ref 4.0–10.5)
nRBC: 0 % (ref 0.0–0.2)

## 2020-01-13 LAB — TROPONIN I (HIGH SENSITIVITY): Troponin I (High Sensitivity): <2 ng/L (ref <18)

## 2020-01-13 NOTE — ED Triage Notes (Signed)
Pt states she has a history of episodes of chest pain lasting 20-30 mins, with no clear etiology historically.  She experienced an episode of mid sternal chest pain today at work with sudden onset, some mild dyspnea.  She denies any radiation. She currently denies chest pain but states when she has an episode the pain is 10/10.

## 2020-01-13 NOTE — Discharge Instructions (Addendum)
Your chest x-ray and blood work this evening was reassuring.  Given your recent symptoms, I recommend that you contact the cardiologist group listed above to arrange follow-up appointment.  You may need Holter monitoring for further evaluation.

## 2020-01-13 NOTE — ED Provider Notes (Signed)
[facility] EMERGENCY DEPARTMENT Provider Note   CSN: [id number] Arrival date & time: 01/13/20  1839     History Chief Complaint  Patient presents with  . Chest Pain    Renee [name] is a 39 y.o. female.  39 year old female history of hypertension brought in by EMS for chest pain.  Patient states that she returned from a break at work around 530 this evening when she developed sudden onset of midsternal chest pressure and tightness.  Symptoms lasted 30 minutes and have resolved completely.  Patient was given 4 aspirin by EMS.  She denies nausea, vomiting, shortness of breath, diaphoresis.  Pain does not radiate, no pain with exertion.  Denies history of hyperlipidemia, diabetes, is a daily smoker.  No significant family history of cardiac events before the age of 95. States 2 weeks ago she felt dizzy while sitting in bed and passed out. No further episodes of dizziness, no prior syncopal episodes. No other complaints or concerns.      HPI: A 39 year old patient with a history of hypertension and obesity presents for evaluation of chest pain. Initial onset of pain was approximately 1-3 hours ago. The patient's chest pain is described as heaviness/pressure/tightness and is not worse with exertion. The patient's chest pain is middle- or left-sided, is not well-localized, is not sharp and does not radiate to the arms/jaw/neck. The patient does not complain of nausea and denies diaphoresis. The patient has smoked in the past 90 days. The patient has no history of stroke, has no history of peripheral artery disease, denies any history of treated diabetes, has no relevant family history of coronary artery disease (first degree relative at less than age 39) and has no history of hypercholesterolemia.   Past Medical History:  Diagnosis Date  . Herpes simplex virus (HSV) infection    no OB +HSV 05/04/16 at [facility]   . Hypertension   . Vaginal Pap smear, abnormal     Patient Active Problem List     Diagnosis Date Noted  . 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 KNEE SURGERY       OB History    Gravida  2   Para      Term      Preterm      AB  2   Living        SAB      TAB      Ectopic      Multiple      Live Births              Family History  Problem Relation Age of Onset  . Congestive Heart Failure Maternal Grandmother   . Hypertension Maternal Grandmother   . Diabetes Maternal Grandmother   . Other Maternal Grandfather        aneursym  . Kidney disease Maternal Grandfather        on dialysis  . Congestive Heart Failure Maternal Grandfather   . Diabetes Maternal Grandfather     Social History   Tobacco Use  . Smoking status: Current Every Day Smoker    Years: 17.00    Types: Cigarettes  . Smokeless tobacco: Never Used  . Tobacco comment: smokes 2-3 cig daily  Substance Use Topics  . Alcohol use: Yes    Comment: socially  . Drug use: No    Home Medications Prior to Admission medications   Medication Sig Start Date  End Date Taking? Authorizing Provider  lisinopril (ZESTRIL) 10 MG tablet Take 1 tablet (10 mg total) by mouth daily. 11/24/18   Burgess Amor, PA-C    Allergies    Patient has no known allergies.  Review of Systems   Review of Systems  Constitutional: Negative for fever.  Respiratory: Positive for chest tightness. Negative for shortness of breath.   Cardiovascular: Positive for chest pain. Negative for palpitations and leg swelling.  Gastrointestinal: Negative for abdominal pain, nausea and vomiting.  Musculoskeletal: Negative for arthralgias, back pain and myalgias.  Skin: Negative for rash and wound.  Allergic/Immunologic: Negative for immunocompromised state.  Neurological: Negative for weakness.  Psychiatric/Behavioral: Negative for confusion.  All other systems reviewed and are negative.   Physical Exam Updated Vital Signs BP (!) 148/85 (BP Location: Left  Arm)   Pulse 82   Temp 98.9 F (37.2 C) (Oral)   Resp 18   LMP 12/30/2019 (Approximate)   SpO2 97%   Physical Exam Vitals and nursing note reviewed.  Constitutional:      General: She is not in acute distress.    Appearance: She is well-developed. She is not diaphoretic.  HENT:     Head: Normocephalic and atraumatic.  Cardiovascular:     Rate and Rhythm: Normal rate and regular rhythm.     Heart sounds: Normal heart sounds. No murmur heard.   Pulmonary:     Effort: Pulmonary effort is normal.     Breath sounds: Normal breath sounds.  Chest:     Chest wall: No tenderness.  Abdominal:     Palpations: Abdomen is soft.     Tenderness: There is no abdominal tenderness.  Musculoskeletal:     Cervical back: Neck supple.     Right lower leg: No tenderness. No edema.     Left lower leg: No tenderness. No edema.  Skin:    General: Skin is warm and dry.     Findings: No erythema or rash.  Neurological:     Mental Status: She is alert and oriented to person, place, and time.  Psychiatric:        Behavior: Behavior normal.     ED Results / Procedures / Treatments   Labs (all labs ordered are listed, but only abnormal results are displayed) Labs Reviewed  CBC - Abnormal; Notable for the following components:      Result Value   WBC 15.7 (*)    All other components within normal limits  BASIC METABOLIC PANEL  TROPONIN I (HIGH SENSITIVITY)    EKG EKG Interpretation  Date/Time:  Monday January 13 2020 18:49:17 EDT Ventricular Rate:  82 PR Interval:    QRS Duration: 83 QT Interval:  346 QTC Calculation: 404 R Axis:   26 Text Interpretation: Sinus rhythm Borderline prolonged PR interval No significant change since prior 8/20 Confirmed by Meridee Score [phone]) on 01/13/2020 6:52:58 PM   Radiology DG Chest Port 1 View  Result Date: 01/13/2020 CLINICAL DATA:  Chest pain. EXAM: PORTABLE CHEST 1 VIEW COMPARISON:  11/24/2018 FINDINGS: The cardiomediastinal contours are  normal. The lungs are clear. Pulmonary vasculature is normal. No consolidation, pleural effusion, or pneumothorax. No acute osseous abnormalities are seen. IMPRESSION: Negative AP view of the chest. Electronically Signed   By: Narda Rutherford M.D.   On: 01/13/2020 19:44    Procedures Procedures (including critical care time)  Medications Ordered in ED Medications - No data to display  ED Course  I have reviewed the triage vital signs and  the nursing notes.  Pertinent labs & imaging results that were available during my care of the patient were reviewed by me and considered in my medical decision making (see chart for details).  Clinical Course as of Jan 13 2047  Mon Jan 13, 2020  5530 39 year old female with chest pain tonight, pain resolved after 30 minutes and has not returned. No chest wall tenderness. HEAR score 3, pending first troponin. CBC with elevated WBC at 15.7, no fevers or sick symptoms. CXR unremarkable. Care signed out pending trop and BMP.    [LM]    Clinical Course User Index [LM] Alden Hipp   MDM Rules/Calculators/A&P HEAR Score: 3                         Final Clinical Impression(s) / ED Diagnoses Final diagnoses:  None    Rx / DC Orders ED Discharge Orders    None       Jeannie Fend, PA-C 01/13/20 2048    Terrilee Files, MD 01/14/20 1037

## 2020-01-13 NOTE — ED Provider Notes (Signed)
   Patient signed out to me by Army Melia, PA-C at end of shift pending completion of work-up.  Patient here for midsternal chest pain that began earlier today.  Pain resolved after 30 minutes.  Patient has heart score of 3.  EKG without acute ischemic changes and patient's troponin is less than 2.  Her vital signs are reassuring.  No tachycardia tachypnea or hypoxia.  PERC negative.  Patient does endorse 1 syncopal episode that occurred 2 weeks ago while sitting on the bed.  Patient's work-up this evening is reassuring.  Given her symptoms and prior syncopal episode, patient would benefit from further evaluation by cardiology and likely Holter monitoring.  I have discussed this with the patient, she does have a PCP that she may follow-up with and I will provide follow-up information for cardiology as well.  She was given strict return precautions.    Labs Reviewed  BASIC METABOLIC PANEL - Abnormal; Notable for the following components:      Result Value   Glucose, Bld 101 (*)    Calcium 10.9 (*)    All other components within normal limits  CBC - Abnormal; Notable for the following components:   WBC 15.7 (*)    All other components within normal limits  TROPONIN I (HIGH SENSITIVITY)     DG Chest Port 1 View  Result Date: 01/13/2020 CLINICAL DATA:  Chest pain. EXAM: PORTABLE CHEST 1 VIEW COMPARISON:  11/24/2018 FINDINGS: The cardiomediastinal contours are normal. The lungs are clear. Pulmonary vasculature is normal. No consolidation, pleural effusion, or pneumothorax. No acute osseous abnormalities are seen. IMPRESSION: Negative AP view of the chest. Electronically Signed   By: Narda Rutherford M.D.   On: 01/13/2020 19:44      Pauline Aus, PA-C 01/13/20 2206    Terrilee Files, MD 01/14/20 1037

## 2020-01-15 DIAGNOSIS — R1013 Epigastric pain: Secondary | ICD-10-CM | POA: Diagnosis not present

## 2020-01-15 DIAGNOSIS — Z6841 Body Mass Index (BMI) 40.0 and over, adult: Secondary | ICD-10-CM | POA: Diagnosis not present

## 2020-01-17 ENCOUNTER — Other Ambulatory Visit (HOSPITAL_COMMUNITY): Payer: Self-pay | Admitting: Internal Medicine

## 2020-01-17 DIAGNOSIS — R1013 Epigastric pain: Secondary | ICD-10-CM

## 2020-01-27 ENCOUNTER — Ambulatory Visit (HOSPITAL_COMMUNITY)
Admission: RE | Admit: 2020-01-27 | Discharge: 2020-01-27 | Disposition: A | Discharge status: Home / Self Care | Payer: BC Managed Care – PPO | Source: Ambulatory Visit | Attending: Internal Medicine | Admitting: Internal Medicine

## 2020-01-27 ENCOUNTER — Other Ambulatory Visit: Payer: Self-pay

## 2020-01-27 DIAGNOSIS — R1013 Epigastric pain: Secondary | ICD-10-CM | POA: Diagnosis not present

## 2020-01-27 DIAGNOSIS — K8020 Calculus of gallbladder without cholecystitis without obstruction: Secondary | ICD-10-CM | POA: Diagnosis not present

## 2020-02-03 DIAGNOSIS — E210 Primary hyperparathyroidism: Secondary | ICD-10-CM | POA: Diagnosis not present

## 2020-02-06 DIAGNOSIS — J301 Allergic rhinitis due to pollen: Secondary | ICD-10-CM | POA: Diagnosis not present

## 2020-02-06 DIAGNOSIS — Z6841 Body Mass Index (BMI) 40.0 and over, adult: Secondary | ICD-10-CM | POA: Diagnosis not present

## 2020-02-06 DIAGNOSIS — J029 Acute pharyngitis, unspecified: Secondary | ICD-10-CM | POA: Diagnosis not present

## 2020-02-10 DIAGNOSIS — I10 Essential (primary) hypertension: Secondary | ICD-10-CM | POA: Diagnosis not present

## 2020-02-10 DIAGNOSIS — M8589 Other specified disorders of bone density and structure, multiple sites: Secondary | ICD-10-CM | POA: Diagnosis not present

## 2020-02-10 DIAGNOSIS — E210 Primary hyperparathyroidism: Secondary | ICD-10-CM | POA: Diagnosis not present

## 2020-02-11 ENCOUNTER — Other Ambulatory Visit: Payer: Self-pay | Admitting: Endocrinology

## 2020-02-11 ENCOUNTER — Other Ambulatory Visit (HOSPITAL_COMMUNITY): Payer: Self-pay | Admitting: Endocrinology

## 2020-02-11 DIAGNOSIS — E210 Primary hyperparathyroidism: Secondary | ICD-10-CM

## 2020-02-17 ENCOUNTER — Other Ambulatory Visit (HOSPITAL_COMMUNITY): Payer: BC Managed Care – PPO

## 2020-02-17 ENCOUNTER — Ambulatory Visit (HOSPITAL_COMMUNITY): Payer: BC Managed Care – PPO

## 2020-02-28 ENCOUNTER — Emergency Department (HOSPITAL_COMMUNITY): Payer: BC Managed Care – PPO

## 2020-02-28 ENCOUNTER — Other Ambulatory Visit: Payer: Self-pay

## 2020-02-28 ENCOUNTER — Ambulatory Visit
Admission: EM | Admit: 2020-02-28 | Discharge: 2020-02-28 | Disposition: A | Discharge status: Home / Self Care | Payer: BC Managed Care – PPO

## 2020-02-28 ENCOUNTER — Emergency Department (HOSPITAL_COMMUNITY)
Admission: EM | Admit: 2020-02-28 | Discharge: 2020-02-28 | Disposition: A | Discharge status: Home / Self Care | Payer: BC Managed Care – PPO | Attending: Emergency Medicine | Admitting: Emergency Medicine

## 2020-02-28 ENCOUNTER — Encounter (HOSPITAL_COMMUNITY): Payer: Self-pay

## 2020-02-28 DIAGNOSIS — R1011 Right upper quadrant pain: Secondary | ICD-10-CM

## 2020-02-28 DIAGNOSIS — K8042 Calculus of bile duct with acute cholecystitis without obstruction: Secondary | ICD-10-CM | POA: Diagnosis not present

## 2020-02-28 DIAGNOSIS — Z79899 Other long term (current) drug therapy: Secondary | ICD-10-CM | POA: Insufficient documentation

## 2020-02-28 DIAGNOSIS — K8050 Calculus of bile duct without cholangitis or cholecystitis without obstruction: Secondary | ICD-10-CM

## 2020-02-28 DIAGNOSIS — F17210 Nicotine dependence, cigarettes, uncomplicated: Secondary | ICD-10-CM | POA: Insufficient documentation

## 2020-02-28 DIAGNOSIS — I10 Essential (primary) hypertension: Secondary | ICD-10-CM | POA: Insufficient documentation

## 2020-02-28 DIAGNOSIS — K8020 Calculus of gallbladder without cholecystitis without obstruction: Secondary | ICD-10-CM | POA: Diagnosis not present

## 2020-02-28 DIAGNOSIS — R1013 Epigastric pain: Secondary | ICD-10-CM | POA: Diagnosis not present

## 2020-02-28 DIAGNOSIS — K760 Fatty (change of) liver, not elsewhere classified: Secondary | ICD-10-CM | POA: Diagnosis not present

## 2020-02-28 LAB — CBC WITH DIFFERENTIAL/PLATELET
Abs Immature Granulocytes: 0.02 10*3/uL (ref 0.00–0.07)
Basophils Absolute: 0 10*3/uL (ref 0.0–0.1)
Basophils Relative: 0 %
Eosinophils Absolute: 0.1 10*3/uL (ref 0.0–0.5)
Eosinophils Relative: 2 %
HCT: 40.6 % (ref 36.0–46.0)
Hemoglobin: 13.2 g/dL (ref 12.0–15.0)
Immature Granulocytes: 0 %
Lymphocytes Relative: 21 %
Lymphs Abs: 1.1 10*3/uL (ref 0.7–4.0)
MCH: 28.8 pg (ref 26.0–34.0)
MCHC: 32.5 g/dL (ref 30.0–36.0)
MCV: 88.5 fL (ref 80.0–100.0)
Monocytes Absolute: 0.7 10*3/uL (ref 0.1–1.0)
Monocytes Relative: 13 %
Neutro Abs: 3.5 10*3/uL (ref 1.7–7.7)
Neutrophils Relative %: 64 %
Platelets: 285 10*3/uL (ref 150–400)
RBC: 4.59 MIL/uL (ref 3.87–5.11)
RDW: 13.2 % (ref 11.5–15.5)
WBC: 5.5 10*3/uL (ref 4.0–10.5)
nRBC: 0 % (ref 0.0–0.2)

## 2020-02-28 LAB — COMPREHENSIVE METABOLIC PANEL
ALT: 19 U/L (ref 0–44)
AST: 23 U/L (ref 15–41)
Albumin: 3.9 g/dL (ref 3.5–5.0)
Alkaline Phosphatase: 65 U/L (ref 38–126)
Anion gap: 5 (ref 5–15)
BUN: 14 mg/dL (ref 6–20)
CO2: 23 mmol/L (ref 22–32)
Calcium: 10.7 mg/dL — ABNORMAL HIGH (ref 8.9–10.3)
Chloride: 107 mmol/L (ref 98–111)
Creatinine, Ser: 0.85 mg/dL (ref 0.44–1.00)
GFR, Estimated: >60 mL/min (ref >60)
Glucose, Bld: 101 mg/dL — ABNORMAL HIGH (ref 70–99)
Potassium: 3.9 mmol/L (ref 3.5–5.1)
Sodium: 135 mmol/L (ref 135–145)
Total Bilirubin: 0.6 mg/dL (ref 0.3–1.2)
Total Protein: 7.3 g/dL (ref 6.5–8.1)

## 2020-02-28 LAB — I-STAT BETA HCG BLOOD, ED (MC, WL, AP ONLY): I-stat hCG, quantitative: <5 m[IU]/mL (ref <5)

## 2020-02-28 LAB — LIPASE, BLOOD: Lipase: 28 U/L (ref 11–51)

## 2020-02-28 MED ORDER — MORPHINE SULFATE (PF) 4 MG/ML IV SOLN
4.0000 mg | Freq: Once | INTRAVENOUS | Status: AC
  Administered 2020-02-28: 4 mg via INTRAVENOUS
  Filled 2020-02-28: qty 1

## 2020-02-28 MED ORDER — FAMOTIDINE 20 MG PO TABS: 20.0000 mg | ORAL_TABLET | Freq: Two times a day (BID) | ORAL | 0 refills | Status: DC

## 2020-02-28 MED ORDER — SODIUM CHLORIDE 0.9 % IV BOLUS
1000.0000 mL | Freq: Once | INTRAVENOUS | Status: AC
  Administered 2020-02-28: 1000 mL via INTRAVENOUS

## 2020-02-28 MED ORDER — ONDANSETRON HCL 4 MG/2ML IJ SOLN
4.0000 mg | Freq: Once | INTRAMUSCULAR | Status: AC
  Administered 2020-02-28: 4 mg via INTRAVENOUS
  Filled 2020-02-28: qty 2

## 2020-02-28 NOTE — Discharge Instructions (Addendum)
Take pepcid as directed   Please follow up with your primary doctor within the next 5-7 days.  If you do not have a primary care provider, information for a healthcare clinic has been provided for you to make arrangements for follow up care. Please return to the ER sooner if you have any new or worsening symptoms, or if you have any of the following symptoms:  Abdominal pain that does not go away.  You have a fever.  You keep throwing up (vomiting).  The pain is felt only in portions of the abdomen. Pain in the right side could possibly be appendicitis. In an adult, pain in the left lower portion of the abdomen could be colitis or diverticulitis.  You pass bloody or black tarry stools.  There is bright red blood in the stool.  The constipation stays for more than 4 days.  There is belly (abdominal) or rectal pain.  You do not seem to be getting better.  You have any questions or concerns.   

## 2020-02-28 NOTE — ED Triage Notes (Signed)
Pt presents with abdominal pain and has h/o gallstones. Pt began hurting last night , has surgery consult scheduled 12/2

## 2020-02-28 NOTE — ED Triage Notes (Addendum)
Pt presents to ED with abdominal pain . States she is supposed to have a appointment with surgeon on Dec 1st to discuss gallbladder removal. Pt c/o pain and nausea since last night. Pt stated she had both covid shots.

## 2020-02-28 NOTE — ED Provider Notes (Signed)
[name] Provider Note   CSN: [id number] Arrival date & time: 02/28/20  1535     History Chief Complaint  Patient presents with  . Abdominal Pain    [name] is a 39 y.o. female.  HPI   Pt is a 39 y/o female who presents to the ED today for eval of abd pain. States pain located to the epigastrium. Pain started last night and has been intermittent since onset. Rates pain 5/10 currently. Pain was 10/10 at its worse. Pain feels sharp in nature. She reports associated nausea, but denies fevers, vomiting, diarrhea, or urinary sxs. States she is chronically constipated. States prior to onset of sxs she ate [employer].  States she has had issues with her gallbladder for the last 5 years but she has been having flare ups more frequently over the last month. Has appt with [facility] Dec 1.   Past Medical History:  Diagnosis Date  . Herpes simplex virus (HSV) infection    no OB +HSV 05/04/16 at [facility]   . Hypertension   . Vaginal Pap smear, abnormal     Patient Active Problem List   Diagnosis Date Noted  . 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 KNEE SURGERY       OB History    Gravida  2   Para      Term      Preterm      AB  2   Living        SAB      TAB      Ectopic      Multiple      Live Births              Family History  Problem Relation Age of Onset  . Congestive Heart Failure Maternal Grandmother   . Hypertension Maternal Grandmother   . Diabetes Maternal Grandmother   . Other Maternal Grandfather        aneursym  . Kidney disease Maternal Grandfather        on dialysis  . Congestive Heart Failure Maternal Grandfather   . Diabetes Maternal Grandfather     Social History   Tobacco Use  . Smoking status: Current Every Day Smoker    Years: 17.00    Types: Cigarettes  . Smokeless tobacco: Never Used  . Tobacco comment: smokes 2-3 cig daily  Substance Use  Topics  . Alcohol use: Yes    Comment: socially  . Drug use: No    Home Medications Prior to Admission medications   Medication Sig Start Date End Date Taking? Authorizing Provider  amLODipine (NORVASC) 5 MG tablet Take 5 mg by mouth daily. 01/10/20   [provider]  famotidine (PEPCID) 20 MG tablet Take 1 tablet (20 mg total) by mouth 2 (two) times daily. 02/28/20   Caidon Foti S, PA-C  lisinopril (ZESTRIL) 10 MG tablet Take 1 tablet (10 mg total) by mouth daily. 11/24/18   Burgess Amor, PA-C    Allergies    Patient has no known allergies.  Review of Systems   Review of Systems  Constitutional: Negative for chills and fever.  HENT: Negative for ear pain and sore throat.   Eyes: Negative for pain and visual disturbance.  Respiratory: Negative for cough and shortness of breath.   Cardiovascular: Negative for chest pain.  Gastrointestinal: Positive for abdominal pain, constipation (chronic) and nausea. Negative for diarrhea  and vomiting.  Genitourinary: Negative for dysuria and hematuria.  Musculoskeletal: Negative for back pain.  Skin: Negative for rash.  Neurological: Negative for seizures and syncope.  All other systems reviewed and are negative.   Physical Exam Updated Vital Signs BP 122/87   Pulse 60   Temp 98.2 F (36.8 C) (Oral)   Resp 18   Ht 5\' 7"  (1.702 m)   Wt 90.7 kg   SpO2 98%   BMI 31.32 kg/m   Physical Exam Vitals and nursing note reviewed.  Constitutional:      General: She is not in acute distress.    Appearance: She is well-developed.  HENT:     Head: Normocephalic and atraumatic.  Eyes:     Conjunctiva/sclera: Conjunctivae normal.  Cardiovascular:     Rate and Rhythm: Normal rate and regular rhythm.     Heart sounds: Normal heart sounds. No murmur heard.   Pulmonary:     Effort: Pulmonary effort is normal. No respiratory distress.     Breath sounds: Normal breath sounds. No wheezing, rhonchi or rales.  Abdominal:     General:  Bowel sounds are normal.     Palpations: Abdomen is soft.     Tenderness: There is abdominal tenderness in the epigastric area. There is guarding. There is no rebound.  Musculoskeletal:     Cervical back: Neck supple.  Skin:    General: Skin is warm and dry.  Neurological:     Mental Status: She is alert.     ED Results / Procedures / Treatments   Labs (all labs ordered are listed, but only abnormal results are displayed) Labs Reviewed  COMPREHENSIVE METABOLIC PANEL - Abnormal; Notable for the following components:      Result Value   Glucose, Bld 101 (*)    Calcium 10.7 (*)    All other components within normal limits  CBC WITH DIFFERENTIAL/PLATELET  LIPASE, BLOOD  I-STAT BETA HCG BLOOD, ED ([facility], [facility], [facility] ONLY)    EKG None  Radiology Abdomen Limited RUQ (LIVER/GB)  Result Date: 02/28/2020 CLINICAL DATA:  Right upper quadrant pain EXAM: ULTRASOUND ABDOMEN LIMITED RIGHT UPPER QUADRANT COMPARISON:  January 27, 2020 FINDINGS: Gallbladder: Stones and sludge seen dependently in the gallbladder without wall thickening, pericholecystic fluid, or Murphy's sign. Common bile duct: Diameter: 3.4 mm Liver: Increased echogenicity in the liver with no focal mass. Portal vein is patent on color Doppler imaging with normal direction of blood flow towards the liver. Other: None. IMPRESSION: 1. Cholelithiasis and sludge without wall thickening, pericholecystic fluid, or Murphy's sign. 2. Probable hepatic steatosis. Electronically Signed   By: January 29, 2020 III M.D   On: 02/28/2020 17:52    Procedures Procedures (including critical care time)  Medications Ordered in ED Medications  sodium chloride 0.9 % bolus 1,000 mL (1,000 mLs Intravenous New Bag/Given 02/28/20 1648)  ondansetron (ZOFRAN) injection 4 mg (4 mg Intravenous Given 02/28/20 1647)  morphine 4 MG/ML injection 4 mg (4 mg Intravenous Given 02/28/20 1648)    ED Course  I have reviewed the triage vital signs and the nursing  notes.  Pertinent labs & imaging results that were available during my care of the patient were reviewed by me and considered in my medical decision making (see chart for details).    MDM Rules/Calculators/A&P                          39 year old female presenting to emergency department today for  evaluation of epigastric abdominal pain.  Has history of gallstones and states she has been having gallbladder flareups for the last 5 years, over the last month symptoms have been much worse and last night symptoms started again.  Pain located to the epigastrium and is associated with nausea.  No fevers at home.  Reviewed/interpreted labs CBC umremarkable CMP unremarkable Lipase unremarkable Beta hcg unremarkable  RUQ [place] with Cholelithiasis and sludge without wall thickening, pericholecystic fluid, or Murphy's sign. 2. Probable hepatic steatosis.  Patient was given medications in the emergency department on the reassessment she states that her symptoms have completely resolved.  She was p.o. challenged without difficulty in the ED.  We discussed reassuring work-up showing no evidence of cholecystitis today.  She has had several visits to the ED for epigastric abdominal pain and is not currently on anything for acid reflux so we will start her on Pepcid to see if this does help her symptoms at all until she can follow-up with general surgery.  Have advised on plan for follow-up and strict return precautions.  She voices understanding of the plan and reasons to return.  All questions answered.  Patient stable for discharge.   Final Clinical Impression(s) / ED Diagnoses Final diagnoses:  RUQ pain  Biliary colic    Rx / DC Orders ED Discharge Orders         Ordered    famotidine (PEPCID) 20 MG tablet  2 times daily        02/28/20 1828           Karrie Meres, PA-C 02/28/20 1831    Vanetta Mulders, MD 03/17/20 1458

## 2020-02-28 NOTE — ED Triage Notes (Signed)
Patient is being discharged from the Urgent Care and sent to the Emergency Department via private vehicle . Per Doyce Para, patient is in need of higher level of care due to abdominal pain from gallstones . Patient is aware and verbalizes understanding of plan of care.  Vitals:   02/28/20 1434  BP: (!) 138/95  Pulse: 76  Resp: 20  Temp: 97.9 F (36.6 C)  SpO2: 98%

## 2020-03-11 ENCOUNTER — Ambulatory Visit: Payer: Self-pay | Admitting: Surgery

## 2020-03-11 ENCOUNTER — Encounter: Payer: Self-pay | Admitting: Surgery

## 2020-03-11 DIAGNOSIS — E210 Primary hyperparathyroidism: Secondary | ICD-10-CM | POA: Diagnosis not present

## 2020-03-11 DIAGNOSIS — K8010 Calculus of gallbladder with chronic cholecystitis without obstruction: Secondary | ICD-10-CM | POA: Diagnosis not present

## 2020-03-11 NOTE — H&P (Signed)
General Surgery [facility]  LEMPI EDWIN Appointment: 03/11/2020 10:30 AM Location: [facility] Patient #: [id number] DOB: 1980/04/21 Single / Language: [place] / Race: Black or African American Female   History of Present Illness Velora Heckler MD; 03/11/2020 11:14 AM) The patient is a 39 year old female who presents for evaluation of gall stones.  CHIEF COMPLAINT: symptomatic cholelithiasis, primary hyperparathyroidism  Patient is referred by Dr. Dorisann Frames for surgical evaluation of symptomatic cholelithiasis as well as primary hyperparathyroidism. Patient's primary care physician is Dr. Lenise Herald in [place], [place]. Patient has approximately a five-year history of intermittent right upper quadrant abdominal pain associated with nausea. This had occurred approximately every 3 months but recently has become more frequent and more severe. Patient denies fevers or chills. She denies jaundice or acholic stools. She has had no prior abdominal surgery. She does have a family history of gallbladder disease and multiple female relatives. Patient has no prior history of hepatobiliary or pancreatic disease. Patient underwent an abdominal ultrasound on January 27, 2020. This demonstrated echogenic sludge and shadowing stones measuring up to 1.4 cm in size. There was no sign of biliary dilatation. Patient presents today to discuss cholecystectomy.  Also of note, the patient has been undergoing evaluation for hypercalcemia. Laboratory studies demonstrate a serum calcium level of 12.0. Intact PTH level is on suppressed and the upper range of normal at 58. 24-hour urine collection for calcium was markedly elevated at 464. Thyroid function is normal with a TSH level of 0.94. Patient does have vitamin D deficiency with a 25-hydroxy vitamin D level of 7.8. Patient has been scheduled for a nuclear medicine parathyroid scan in early January 2022.  Patient has not yet had an ultrasound examination of the neck. She denies any complications from hypercalcemia with the exception of significant chronic fatigue. There is no family history of endocrine neoplasms. Patient has had no prior head or neck surgery.   Past Surgical History (Chanel Lonni Fix, CMA; 03/11/2020 10:26 AM) Knee Surgery  Left.  Diagnostic Studies History (Chanel Lonni Fix, CMA; 03/11/2020 10:26 AM) Mammogram  never Pap Smear  >5 years ago  Allergies Hedy Camara Lonni Fix, CMA; 03/11/2020 10:27 AM) No Known Drug Allergies  [03/11/2020]: Allergies Reconciled   Medication History (Chanel Lonni Fix, CMA; 03/11/2020 10:27 AM) amLODIPine Besylate (5MG  Tablet, Oral) Active. Famotidine (20MG  Tablet, Oral) Active. Losartan Potassium (100MG  Tablet, Oral) Active. Medications Reconciled  Social History , CMA; 03/11/2020 10:26 AM) Illicit drug use  Uses monthly. Tobacco use  Current every day smoker.  Family History (Chanel , CMA; 03/11/2020 10:26 AM) Diabetes Mellitus  Family Members In General. Heart Disease  Family Members In General. Hypertension  Family Members In General.  Pregnancy / Birth History 14/04/2019, CMA; 03/11/2020 10:26 AM) Age at menarche  12 years. Gravida  2 Maternal age  48-20 Para  0 Regular periods   Other Problems (Chanel Micheal Likens, CMA; 03/11/2020 10:26 AM) Arthritis  Cholelithiasis  Gastroesophageal Reflux Disease  High blood pressure  Thyroid Disease     Review of Systems (Chanel Nolan CMA; 03/11/2020 10:26 AM) General Not Present- Appetite Loss, Chills, Fatigue, Fever, Night Sweats, Weight Gain and Weight Loss. Skin Not Present- Change in Wart/Mole, Dryness, Hives, Jaundice, New Lesions, Non-Healing Wounds, Rash and Ulcer. HEENT Not Present- Earache, Hearing Loss, Hoarseness, Nose Bleed, Oral Ulcers, Ringing in the Ears, Seasonal Allergies, Sinus Pain, Sore Throat, Visual Disturbances, Wears glasses/contact lenses and  Yellow Eyes. Respiratory Present- Snoring. Not Present- Bloody sputum, Chronic  Cough, Difficulty Breathing and Wheezing. Breast Not Present- Breast Mass, Breast Pain, Nipple Discharge and Skin Changes. Cardiovascular Not Present- Chest Pain, Difficulty Breathing Lying Down, Leg Cramps, Palpitations, Rapid Heart Rate, Shortness of Breath and Swelling of Extremities. Gastrointestinal Present- Constipation. Not Present- Abdominal Pain, Bloating, Bloody Stool, Change in Bowel Habits, Chronic diarrhea, Difficulty Swallowing, Excessive gas, Gets full quickly at meals, Hemorrhoids, Indigestion, Nausea, Rectal Pain and Vomiting. Female Genitourinary Present- Nocturia. Not Present- Frequency, Painful Urination, Pelvic Pain and Urgency. Musculoskeletal Present- Joint Pain. Not Present- Back Pain, Joint Stiffness, Muscle Pain, Muscle Weakness and Swelling of Extremities. Neurological Not Present- Decreased Memory, Fainting, Headaches, Numbness, Seizures, Tingling, Tremor, Trouble walking and Weakness. Psychiatric Present- Anxiety and Frequent crying. Not Present- Bipolar, Change in Sleep Pattern, Depression and Fearful. Endocrine Not Present- Cold Intolerance, Excessive Hunger, Hair Changes, Heat Intolerance, Hot flashes and New Diabetes. Hematology Not Present- Blood Thinners, Easy Bruising, Excessive bleeding, Gland problems, HIV and Persistent Infections.  Vitals (Chanel Nolan CMA; 03/11/2020 10:28 AM) 03/11/2020 10:28 AM Weight: 230.5 lb Height: 61in Body Surface Area: 2.01 m Body Mass Index: 43.55 kg/m  Temp.: 97.34F  Pulse: 94 (Regular)  BP: 130/84(Sitting, Left Arm, Standard)       Physical Exam Velora Heckler MD; 03/11/2020 11:14 AM) The physical exam findings are as follows: Note: GENERAL APPEARANCE Development: normal Nutritional status: normal Gross deformities: none  SKIN Rash, lesions, ulcers: none Induration, erythema: none Nodules: none palpable  EYES Conjunctiva  and lids: normal Pupils: equal and reactive Iris: normal bilaterally  EARS, NOSE, MOUTH, THROAT External ears: no lesion or deformity External nose: no lesion or deformity Hearing: grossly normal Due to Covid-19 pandemic, patient is wearing a mask.  NECK Symmetric: yes Trachea: midline Thyroid: no palpable nodules in the thyroid bed  CHEST Respiratory effort: normal Retraction or accessory muscle use: no Breath sounds: normal bilaterally Rales, rhonchi, wheeze: none  CARDIOVASCULAR Auscultation: regular rhythm, normal rate Murmurs: none Pulses: radial pulse 2+ palpable Lower extremity edema: none  ABDOMEN Distension: none Masses: none palpable Tenderness: none Hepatosplenomegaly: not present Hernia: not present  MUSCULOSKELETAL Station and gait: normal Digits and nails: no clubbing or cyanosis Muscle strength: grossly normal all extremities Range of motion: grossly normal all extremities Deformity: none  LYMPHATIC Cervical: none palpable Supraclavicular: none palpable  PSYCHIATRIC Oriented to person, place, and time: yes Mood and affect: normal for situation Judgment and insight: appropriate for situation    Assessment & Plan Velora Heckler MD; 03/11/2020 11:17 AM) PRIMARY HYPERPARATHYROIDISM (E21.0) CHOLELITHIASIS AND CHOLECYSTITIS WITHOUT OBSTRUCTION (K80.10) Current Plans Patient is referred by her endocrinologist for surgical evaluation and management of symptomatic cholelithiasis as well as evaluation for primary hyperparathyroidism. Patient is provided with written literature for both conditions.  Patient provided with a copy of "Parathyroid Surgery: Treatment for Your Parathyroid Gland Problem", published by Kr[employer], 12 pages. Book reviewed and explained to patient during visit today.  Patient is experiencing intermittent symptoms related to cholelithiasis. She is interested in moving forward with laparoscopic gallbladder surgery. She does not  want to undergo gallbladder surgery and parathyroid surgery concurrently. Additional diagnostic testing for her parathyroid condition is planned in January 2022.  Patient does have symptomatic cholelithiasis and likely chronic cholecystitis with intermittent symptoms over the past 5 years. I provided her with written literature on laparoscopic cholecystectomy. We reviewed this today in the office. I explained the procedure. We discussed the hospital stay. We discussed the potential for conversion to open surgery. We discussed her postoperative recovery. She understands  and wishes to proceed with gallbladder surgery in the near future.  The risks and benefits of the procedure have been discussed at length with the patient. The patient understands the proposed procedure, potential alternative treatments, and the course of recovery to be expected. All of the patient's questions have been answered at this time. The patient wishes to proceed with surgery.  Darnell Level, MD [facility] Office: [phone]

## 2020-03-12 ENCOUNTER — Other Ambulatory Visit (HOSPITAL_COMMUNITY): Payer: Self-pay | Admitting: Surgery

## 2020-03-12 ENCOUNTER — Other Ambulatory Visit: Payer: Self-pay | Admitting: Surgery

## 2020-03-12 DIAGNOSIS — E210 Primary hyperparathyroidism: Secondary | ICD-10-CM

## 2020-03-18 ENCOUNTER — Other Ambulatory Visit: Payer: Self-pay

## 2020-03-18 ENCOUNTER — Ambulatory Visit
Admission: EM | Admit: 2020-03-18 | Discharge: 2020-03-18 | Disposition: A | Discharge status: Home / Self Care | Payer: BC Managed Care – PPO | Attending: Emergency Medicine | Admitting: Emergency Medicine

## 2020-03-18 DIAGNOSIS — M2392 Unspecified internal derangement of left knee: Secondary | ICD-10-CM | POA: Diagnosis not present

## 2020-03-18 DIAGNOSIS — J069 Acute upper respiratory infection, unspecified: Secondary | ICD-10-CM

## 2020-03-18 DIAGNOSIS — Z1152 Encounter for screening for COVID-19: Secondary | ICD-10-CM | POA: Diagnosis not present

## 2020-03-18 MED ORDER — DEXAMETHASONE 4 MG PO TABS: 4.0000 mg | ORAL_TABLET | Freq: Every day | ORAL | 0 refills | Status: AC

## 2020-03-18 MED ORDER — FLUTICASONE PROPIONATE 50 MCG/ACT NA SUSP: 1.0000 | Freq: Every day | NASAL | 0 refills | Status: DC

## 2020-03-18 MED ORDER — CETIRIZINE HCL 10 MG PO TABS: 10.0000 mg | ORAL_TABLET | Freq: Every day | ORAL | 0 refills | Status: DC

## 2020-03-18 MED ORDER — BENZONATATE 100 MG PO CAPS: 100.0000 mg | ORAL_CAPSULE | Freq: Three times a day (TID) | ORAL | 0 refills | Status: DC | PRN

## 2020-03-18 NOTE — Discharge Instructions (Signed)
COVID testing ordered.  It will take between 2-7 days for test results.  Someone will contact you regarding abnormal results.    In the meantime: You should remain isolated in your home for 10 days from symptom onset AND greater than 24 hours after symptoms resolution (absence of fever without the use of fever-reducing medication and improvement in respiratory symptoms), whichever is longer Get plenty of rest and push fluids Tessalon Perles prescribed for cough Zyrtec for nasal congestion, runny nose, and/or sore throat Fonase for nasal congestion and runny nose Decadron was prescribed Use medications daily for symptom relief Use OTC medications like ibuprofen or tylenol as needed fever or pain Call or go to the ED if you have any new or worsening symptoms such as fever, worsening cough, shortness of breath, chest tightness, chest pain, turning blue, changes in mental status, etc...  

## 2020-03-18 NOTE — ED Provider Notes (Signed)
[facility]   [id number] 03/18/20 Arrival Time: 1414   CC: COVID symptoms  SUBJECTIVE: History from: patient.  Renee [name] is a 39 y.o. female who presentsto the urgent care with a complaint of cough, nasal congestion for the past few days.  Denies sick exposure to COVID, flu or strep.  Denies recent travel.  Has tried OTC medication without relief.  Denies of any aggravating factors.  Denies previous symptoms in the past.   Denies fever, chills, fatigue, sinus pain, rhinorrhea, sore throat, SOB, wheezing, chest pain, nausea, changes in bowel or bladder habits.    ROS: As per HPI.  All other pertinent ROS negative.      Past Medical History:  Diagnosis Date  . Herpes simplex virus (HSV) infection    no OB +HSV 05/04/16 at [facility]   . Hypertension   . Vaginal Pap smear, abnormal    Past Surgical History:  Procedure Laterality Date  . KNEE SURGERY     No Known Allergies No current facility-administered medications on file prior to encounter.   Current Outpatient Medications on File Prior to Encounter  Medication Sig Dispense Refill  . amLODipine (NORVASC) 5 MG tablet Take 5 mg by mouth daily.    . famotidine (PEPCID) 20 MG tablet Take 1 tablet (20 mg total) by mouth 2 (two) times daily. 30 tablet 0  . lisinopril (ZESTRIL) 10 MG tablet Take 1 tablet (10 mg total) by mouth daily. 30 tablet 0   Social History   Socioeconomic History  . Marital status: Single    Spouse name: Not on file  . Number of children: Not on file  . Years of education: Not on file  . Highest education level: Not on file  Occupational History  . Not on file  Tobacco Use  . Smoking status: Current Every Day Smoker    Years: 17.00    Types: Cigarettes  . Smokeless tobacco: Never Used  . Tobacco comment: smokes 2-3 cig daily  Substance and Sexual Activity  . Alcohol use: Yes    Comment: socially  . Drug use: No  . Sexual activity: Yes    Birth control/protection: None  Other Topics  Concern  . Not on file  Social History Narrative  . Not on file   Social Determinants of Health   Financial Resource Strain:   . Difficulty of Paying Living Expenses: Not on file  Food Insecurity:   . Worried About Programme researcher, broadcasting/film/video in the Last Year: Not on file  . Ran Out of Food in the Last Year: Not on file  Transportation Needs:   . Lack of Transportation (Medical): Not on file  . Lack of Transportation (Non-Medical): Not on file  Physical Activity:   . Days of Exercise per Week: Not on file  . Minutes of Exercise per Session: Not on file  Stress:   . Feeling of Stress : Not on file  Social Connections:   . Frequency of Communication with Friends and Family: Not on file  . Frequency of Social Gatherings with Friends and Family: Not on file  . Attends Religious Services: Not on file  . Active Member of Clubs or Organizations: Not on file  . Attends Banker Meetings: Not on file  . Marital Status: Not on file  Intimate Partner Violence:   . Fear of Current or Ex-Partner: Not on file  . Emotionally Abused: Not on file  . Physically Abused: Not on file  . Sexually  Abused: Not on file   Family History  Problem Relation Age of Onset  . Congestive Heart Failure Maternal Grandmother   . Hypertension Maternal Grandmother   . Diabetes Maternal Grandmother   . Other Maternal Grandfather        aneursym  . Kidney disease Maternal Grandfather        on dialysis  . Congestive Heart Failure Maternal Grandfather   . Diabetes Maternal Grandfather     OBJECTIVE:  Vitals:   03/18/20 1427  BP: (!) 135/93  Pulse: 75  Resp: 16  Temp: 98.3 F (36.8 C)  SpO2: 97%     General appearance: alert; appears fatigued, but nontoxic; speaking in full sentences and tolerating own secretions HEENT: NCAT; Ears: EACs clear, TMs pearly gray; Eyes: PERRL.  EOM grossly intact. Sinuses: nontender; Nose: nares patent without rhinorrhea, Throat: oropharynx clear, tonsils non  erythematous or enlarged, uvula midline  Neck: supple without LAD Lungs: unlabored respirations, symmetrical air entry; cough: moderate; no respiratory distress; CTAB Heart: regular rate and rhythm.  Radial pulses 2+ symmetrical bilaterally Skin: warm and dry Psychological: alert and cooperative; normal mood and affect  LABS:  No results found for this or any previous visit (from the past 24 hour(s)).   ASSESSMENT & PLAN:  1. Locking of left knee   2. Encounter for screening for COVID-19   3. URI with cough and congestion     Meds ordered this encounter  Medications  . fluticasone (FLONASE) 50 MCG/ACT nasal spray    Sig: Place 1 spray into both nostrils daily for 14 days.    Dispense:  16 g    Refill:  0  . dexamethasone (DECADRON) 4 MG tablet    Sig: Take 1 tablet (4 mg total) by mouth daily for 7 days.    Dispense:  7 tablet    Refill:  0  . cetirizine (ZYRTEC ALLERGY) 10 MG tablet    Sig: Take 1 tablet (10 mg total) by mouth daily.    Dispense:  30 tablet    Refill:  0  . benzonatate (TESSALON) 100 MG capsule    Sig: Take 1 capsule (100 mg total) by mouth 3 (three) times daily as needed for cough.    Dispense:  30 capsule    Refill:  0    Discharge instructions  COVID testing ordered.  It will take between 2-7 days for test results.  Someone will contact you regarding abnormal results.    In the meantime: You should remain isolated in your home for 10 days from symptom onset AND greater than 24 hours after symptoms resolution (absence of fever without the use of fever-reducing medication and improvement in respiratory symptoms), whichever is longer Get plenty of rest and push fluids Tessalon Perles prescribed for cough Zyrtec for nasal congestion, runny nose, and/or sore throat Fonase for nasal congestion and runny nose Decadron was prescribed Use medications daily for symptom relief Use OTC medications like ibuprofen or tylenol as needed fever or pain Call or go  to the ED if you have any new or worsening symptoms such as fever, worsening cough, shortness of breath, chest tightness, chest pain, turning blue, changes in mental status, etc...   Reviewed expectations re: course of current medical issues. Questions answered. Outlined signs and symptoms indicating need for more acute intervention. Patient verbalized understanding. After Visit Summary given.         Durward Parcel, FNP 03/18/20 1505

## 2020-03-18 NOTE — ED Triage Notes (Signed)
Pt presents with nasal congestion for past few days  

## 2020-03-19 LAB — NOVEL CORONAVIRUS, NAA: SARS-CoV-2, NAA: NOT DETECTED

## 2020-03-19 LAB — SARS-COV-2, NAA 2 DAY TAT

## 2020-03-30 NOTE — Patient Instructions (Addendum)
DUE TO COVID-19 ONLY ONE VISITOR IS ALLOWED TO COME WITH YOU AND STAY IN THE WAITING ROOM ONLY DURING PRE OP AND PROCEDURE DAY OF SURGERY. THE 1 VISITOR  MAY VISIT WITH YOU AFTER SURGERY IN YOUR PRIVATE ROOM DURING VISITING HOURS ONLY!  YOU NEED TO HAVE A COVID 19 TEST ON_12/31______ @__9 :10_____, THIS TEST MUST BE DONE BEFORE SURGERY,  COVID TESTING SITE [address] [place] , IT IS ON THE RIGHT GOING OUT [address] APPROXIMATELY  2 MINUTES PAST [employer] ON THE RIGHT. ONCE YOUR COVID TEST IS COMPLETED,  PLEASE BEGIN THE QUARANTINE INSTRUCTIONS AS OUTLINED IN YOUR HANDOUT.                Renee [name]   Your procedure is scheduled on: 04/13/20   Report to [facility] Main  Entrance   Report to admitting at   6:30 AM     Call this number if you have problems the morning of surgery [phone]    Remember: Do not eat food After Midnight.  You may have clear liquids until 5:30 AM  BRUSH YOUR TEETH MORNING OF SURGERY AND RINSE YOUR MOUTH OUT, NO CHEWING GUM CANDY OR MINTS.     Take these medicines the morning of surgery with A SIP OF WATER: Amlodipine                                 You may not have any metal on your body including              piercings  Do not wear jewelry, lotions, powders or deodorant            No fingernail polish or makeup.              Do not bring valuables to the hospital. [facility] IS NOT             RESPONSIBLE   FOR VALUABLES.  Contacts, dentures or bridgework may not be worn into surgery.       Patients discharged the day of surgery will not be allowed to drive home.   IF YOU ARE HAVING SURGERY AND GOING HOME THE SAME DAY, YOU MUST HAVE AN ADULT TO DRIVE YOU HOME AND BE WITH YOU FOR 24 HOURS.   YOU MAY GO HOME BY TAXI OR UBER OR ORTHERWISE, BUT AN ADULT MUST ACCOMPANY YOU HOME AND STAY WITH YOU FOR 24 HOURS.  Name and phone number of your driver:  Special Instructions: N/A              Please  read over the following fact sheets you were given: _____________________________________________________________________             [facility] - Preparing for Surgery Before surgery, you can play an important role.   Because skin is not sterile, your skin needs to be as free of germs as possible.   You can reduce the number of germs on your skin by washing with CHG (chlorahexidine gluconate) soap before surgery.   CHG is an antiseptic cleaner which kills germs and bonds with the skin to continue killing germs even after washing. Please DO NOT use if you have an allergy to CHG or antibacterial soaps .  If your skin becomes reddened/irritated stop using the CHG and inform your nurse when you arrive at Short Stay. Do not shave (including legs and underarms)  for at least 48 hours prior to the first CHG shower.  Please follow these instructions carefully:  1.  Shower with CHG Soap the night before surgery and the  morning of Surgery.  2.  If you choose to wash your hair, wash your hair first as usual with your  normal  shampoo.  3.  After you shampoo, rinse your hair and body thoroughly to remove the  shampoo.                                        4.  Use CHG as you would any other liquid soap.  You can apply chg directly  to the skin and wash                       Gently with a scrungie or clean washcloth.  5.  Apply the CHG Soap to your body ONLY FROM THE NECK DOWN.   Do not use on face/ open                           Wound or open sores. Avoid contact with eyes, ears mouth and genitals (private parts).                       Wash face,  Genitals (private parts) with your normal soap.             6.  Wash thoroughly, paying special attention to the area where your surgery  will be performed.  7.  Thoroughly rinse your body with warm water from the neck down.  8.  DO NOT shower/wash with your normal soap after using and rinsing off  the CHG Soap.             9.  Pat yourself dry with a  clean towel.            10.  Wear clean pajamas.            11.  Place clean sheets on your bed the night of your first shower and do not  sleep with pets. Day of Surgery : Do not apply any lotions/deodorants the morning of surgery.  Please wear clean clothes to the hospital/surgery center.  FAILURE TO FOLLOW THESE INSTRUCTIONS MAY RESULT IN THE CANCELLATION OF YOUR SURGERY PATIENT SIGNATURE_________________________________  NURSE SIGNATURE__________________________________  ________________________________________________________________________

## 2020-03-31 ENCOUNTER — Encounter (HOSPITAL_COMMUNITY)
Admission: RE | Admit: 2020-03-31 | Discharge: 2020-03-31 | Disposition: A | Discharge status: Home / Self Care | Payer: BC Managed Care – PPO | Source: Ambulatory Visit | Attending: Surgery | Admitting: Surgery

## 2020-03-31 ENCOUNTER — Other Ambulatory Visit: Payer: Self-pay

## 2020-03-31 ENCOUNTER — Encounter (HOSPITAL_COMMUNITY): Payer: Self-pay

## 2020-03-31 DIAGNOSIS — Z01812 Encounter for preprocedural laboratory examination: Secondary | ICD-10-CM | POA: Insufficient documentation

## 2020-03-31 HISTORY — DX: Hypothyroidism, unspecified: E03.9

## 2020-03-31 LAB — CBC
HCT: 39.7 % (ref 36.0–46.0)
Hemoglobin: 13 g/dL (ref 12.0–15.0)
MCH: 28.9 pg (ref 26.0–34.0)
MCHC: 32.7 g/dL (ref 30.0–36.0)
MCV: 88.2 fL (ref 80.0–100.0)
Platelets: 273 10*3/uL (ref 150–400)
RBC: 4.5 MIL/uL (ref 3.87–5.11)
RDW: 13.2 % (ref 11.5–15.5)
WBC: 10 10*3/uL (ref 4.0–10.5)
nRBC: 0 % (ref 0.0–0.2)

## 2020-03-31 NOTE — Progress Notes (Signed)
COVID Vaccine Completed:Yes Date COVID Vaccine completed:07/30/19 COVID vaccine manufacturer: Pfizer     PCP - Lenise Herald PA Cardiologist - no  Chest x-ray - 01/13/20 EKG - 01/14/20-Epic Stress Test - no ECHO - no Cardiac Cath - no Pacemaker/ICD device last checked:NA  Sleep Study - no CPAP -   Fasting Blood Sugar -NA  Checks Blood Sugar _____ times a day  Blood Thinner Instructions:NA Aspirin Instructions: Last Dose:  Anesthesia review:   Patient denies shortness of breath, fever, cough and chest pain at PAT appointment yes  Patient verbalized understanding of instructions that were given to them at the PAT appointment. Patient was also instructed that they will need to review over the PAT instructions again at home before surgery. Yes Pt reports no SOB with any activities but knee pain and climbs stairs slowly. Her BP was 158/98 . Pt was told to monitor her pressure at home and call MD if it stays high.

## 2020-04-02 ENCOUNTER — Encounter (HOSPITAL_COMMUNITY): Payer: Self-pay | Admitting: Surgery

## 2020-04-02 DIAGNOSIS — E210 Primary hyperparathyroidism: Secondary | ICD-10-CM | POA: Diagnosis present

## 2020-04-02 DIAGNOSIS — K8010 Calculus of gallbladder with chronic cholecystitis without obstruction: Secondary | ICD-10-CM | POA: Diagnosis present

## 2020-04-02 NOTE — H&P (Signed)
General Surgery [facility]  TIMARA LOMA DOB: 26-May-1980 Single / Language: [place] / Race: Black or African American Female   History of Present Illness   The patient is a 39 year old female who presents for evaluation of gall stones.  CHIEF COMPLAINT: symptomatic cholelithiasis, primary hyperparathyroidism  Patient is referred by Dr. Dorisann Frames for surgical evaluation of symptomatic cholelithiasis as well as primary hyperparathyroidism. Patient's primary care physician is Dr. Lenise Herald in [place], [place]. Patient has approximately a five-year history of intermittent right upper quadrant abdominal pain associated with nausea. This had occurred approximately every 3 months but recently has become more frequent and more severe. Patient denies fevers or chills. She denies jaundice or acholic stools. She has had no prior abdominal surgery. She does have a family history of gallbladder disease and multiple female relatives. Patient has no prior history of hepatobiliary or pancreatic disease. Patient underwent an abdominal ultrasound on January 27, 2020. This demonstrated echogenic sludge and shadowing stones measuring up to 1.4 cm in size. There was no sign of biliary dilatation. Patient presents today to discuss cholecystectomy.  Also of note, the patient has been undergoing evaluation for hypercalcemia. Laboratory studies demonstrate a serum calcium level of 12.0. Intact PTH level is on suppressed and the upper range of normal at 58. 24-hour urine collection for calcium was markedly elevated at 464. Thyroid function is normal with a TSH level of 0.94. Patient does have vitamin D deficiency with a 25-hydroxy vitamin D level of 7.8. Patient has been scheduled for a nuclear medicine parathyroid scan in early January 2022. Patient has not yet had an ultrasound examination of the neck. She denies any complications from hypercalcemia with the  exception of significant chronic fatigue. There is no family history of endocrine neoplasms. Patient has had no prior head or neck surgery.   Past Surgical History Knee Surgery  Left.  Diagnostic Studies History  Mammogram  never Pap Smear  >5 years ago  Allergies  No Known Drug Allergies  Allergies Reconciled   Medication History  amLODIPine Besylate (5MG  Tablet, Oral) Active. Famotidine (20MG  Tablet, Oral) Active. Losartan Potassium (100MG  Tablet, Oral) Active. Medications Reconciled  Social History  Illicit drug use  Uses monthly. Tobacco use  Current every day smoker.  Family History  Diabetes Mellitus  Family Members In General. Heart Disease  Family Members In General. Hypertension  Family Members In General.  Pregnancy / Birth History  Age at menarche  12 years. Gravida  2 Maternal age  76-20 Para  0 Regular periods   Other Problems  Arthritis  Cholelithiasis  Gastroesophageal Reflux Disease  High blood pressure  Thyroid Disease   Review of Systems  General Not Present- Appetite Loss, Chills, Fatigue, Fever, Night Sweats, Weight Gain and Weight Loss. Skin Not Present- Change in Wart/Mole, Dryness, Hives, Jaundice, New Lesions, Non-Healing Wounds, Rash and Ulcer. HEENT Not Present- Earache, Hearing Loss, Hoarseness, Nose Bleed, Oral Ulcers, Ringing in the Ears, Seasonal Allergies, Sinus Pain, Sore Throat, Visual Disturbances, Wears glasses/contact lenses and Yellow Eyes. Respiratory Present- Snoring. Not Present- Bloody sputum, Chronic Cough, Difficulty Breathing and Wheezing. Breast Not Present- Breast Mass, Breast Pain, Nipple Discharge and Skin Changes. Cardiovascular Not Present- Chest Pain, Difficulty Breathing Lying Down, Leg Cramps, Palpitations, Rapid Heart Rate, Shortness of Breath and Swelling of Extremities. Gastrointestinal Present- Constipation. Not Present- Abdominal Pain, Bloating, Bloody Stool, Change in Bowel Habits,  Chronic diarrhea, Difficulty Swallowing, Excessive gas, Gets full quickly at meals, Hemorrhoids,  Indigestion, Nausea, Rectal Pain and Vomiting. Female Genitourinary Present- Nocturia. Not Present- Frequency, Painful Urination, Pelvic Pain and Urgency. Musculoskeletal Present- Joint Pain. Not Present- Back Pain, Joint Stiffness, Muscle Pain, Muscle Weakness and Swelling of Extremities. Neurological Not Present- Decreased Memory, Fainting, Headaches, Numbness, Seizures, Tingling, Tremor, Trouble walking and Weakness. Psychiatric Present- Anxiety and Frequent crying. Not Present- Bipolar, Change in Sleep Pattern, Depression and Fearful. Endocrine Not Present- Cold Intolerance, Excessive Hunger, Hair Changes, Heat Intolerance, Hot flashes and New Diabetes. Hematology Not Present- Blood Thinners, Easy Bruising, Excessive bleeding, Gland problems, HIV and Persistent Infections.  Vitals Weight: 230.5 lb Height: 61in Body Surface Area: 2.01 m Body Mass Index: 43.55 kg/m  Temp.: 97.25F  Pulse: 94 (Regular)  BP: 130/84(Sitting, Left Arm, Standard)  Physical Exam   GENERAL APPEARANCE Development: normal Nutritional status: normal Gross deformities: none  SKIN Rash, lesions, ulcers: none Induration, erythema: none Nodules: none palpable  EYES Conjunctiva and lids: normal Pupils: equal and reactive Iris: normal bilaterally  EARS, NOSE, MOUTH, THROAT External ears: no lesion or deformity External nose: no lesion or deformity Hearing: grossly normal Due to Covid-19 pandemic, patient is wearing a mask.  NECK Symmetric: yes Trachea: midline Thyroid: no palpable nodules in the thyroid bed  CHEST Respiratory effort: normal Retraction or accessory muscle use: no Breath sounds: normal bilaterally Rales, rhonchi, wheeze: none  CARDIOVASCULAR Auscultation: regular rhythm, normal rate Murmurs: none Pulses: radial pulse 2+ palpable Lower extremity edema:  none  ABDOMEN Distension: none Masses: none palpable Tenderness: none Hepatosplenomegaly: not present Hernia: not present  MUSCULOSKELETAL Station and gait: normal Digits and nails: no clubbing or cyanosis Muscle strength: grossly normal all extremities Range of motion: grossly normal all extremities Deformity: none  LYMPHATIC Cervical: none palpable Supraclavicular: none palpable  PSYCHIATRIC Oriented to person, place, and time: yes Mood and affect: normal for situation Judgment and insight: appropriate for situation    Assessment & Plan   PRIMARY HYPERPARATHYROIDISM (E21.0) CHOLELITHIASIS AND CHOLECYSTITIS WITHOUT OBSTRUCTION (K80.10)  Patient is referred by her endocrinologist for surgical evaluation and management of symptomatic cholelithiasis as well as evaluation for primary hyperparathyroidism. Patient is provided with written literature for both conditions.  Patient provided with a copy of "Parathyroid Surgery: Treatment for Your Parathyroid Gland Problem", published by [employer], 12 pages. Book reviewed and explained to patient during visit today.  Patient is experiencing intermittent symptoms related to cholelithiasis. She is interested in moving forward with laparoscopic gallbladder surgery. She does not want to undergo gallbladder surgery and parathyroid surgery concurrently. Additional diagnostic testing for her parathyroid condition is planned in January 2022.  Patient does have symptomatic cholelithiasis and likely chronic cholecystitis with intermittent symptoms over the past 5 years. I provided her with written literature on laparoscopic cholecystectomy. We reviewed this today in the office. I explained the procedure. We discussed the hospital stay. We discussed the potential for conversion to open surgery. We discussed her postoperative recovery. She understands and wishes to proceed with gallbladder surgery in the near future.  The risks and  benefits of the procedure have been discussed at length with the patient. The patient understands the proposed procedure, potential alternative treatments, and the course of recovery to be expected. All of the patient's questions have been answered at this time. The patient wishes to proceed with surgery.  Patient will also require further evaluation for possible parathyroid adenoma and need for minimally invasive parathyroidectomy. We will address these issues after her diagnostic studies are completed in January 2022.  Darnell Level, MD [facility] Office: [phone]

## 2020-04-09 ENCOUNTER — Other Ambulatory Visit (HOSPITAL_COMMUNITY)
Admission: RE | Admit: 2020-04-09 | Discharge: 2020-04-09 | Disposition: A | Discharge status: Home / Self Care | Payer: BC Managed Care – PPO | Source: Ambulatory Visit | Attending: Surgery | Admitting: Surgery

## 2020-04-09 DIAGNOSIS — Z01812 Encounter for preprocedural laboratory examination: Secondary | ICD-10-CM | POA: Diagnosis not present

## 2020-04-09 DIAGNOSIS — Z20822 Contact with and (suspected) exposure to covid-19: Secondary | ICD-10-CM | POA: Diagnosis not present

## 2020-04-09 LAB — SARS CORONAVIRUS 2 (TAT 6-24 HRS): SARS Coronavirus 2: NEGATIVE

## 2020-04-10 ENCOUNTER — Other Ambulatory Visit (HOSPITAL_COMMUNITY): Payer: BC Managed Care – PPO

## 2020-04-12 NOTE — Anesthesia Preprocedure Evaluation (Addendum)
Anesthesia Evaluation  Patient identified by MRN, date of birth, ID band Patient awake    Reviewed: Allergy & Precautions, NPO status , Patient's Chart, lab work & pertinent test results  Airway Mallampati: II       Dental no notable dental hx.    Pulmonary Current Smoker and Patient abstained from smoking.,    Pulmonary exam normal        Cardiovascular hypertension, Pt. on medications Normal cardiovascular exam     Neuro/Psych negative neurological ROS  negative psych ROS   GI/Hepatic Neg liver ROS,   Endo/Other  Morbid obesity  Renal/GU negative Renal ROS  negative genitourinary   Musculoskeletal negative musculoskeletal ROS (+)   Abdominal (+) + obese,   Peds  Hematology negative hematology ROS (+)   Anesthesia Other Findings   Reproductive/Obstetrics                            Anesthesia Physical Anesthesia Plan  ASA: III  Anesthesia Plan: General   Post-op Pain Management:    Induction: Intravenous  PONV Risk Score and Plan: Ondansetron and Dexamethasone  Airway Management Planned: Oral ETT  Additional Equipment: None  Intra-op Plan:   Post-operative Plan: Extubation in OR  Informed Consent: I have reviewed the patients History and Physical, chart, labs and discussed the procedure including the risks, benefits and alternatives for the proposed anesthesia with the patient or authorized representative who has indicated his/her understanding and acceptance.     Dental advisory given  Plan Discussed with: CRNA  Anesthesia Plan Comments:        Anesthesia Quick Evaluation

## 2020-04-13 ENCOUNTER — Ambulatory Visit (HOSPITAL_COMMUNITY): Payer: BC Managed Care – PPO

## 2020-04-13 ENCOUNTER — Ambulatory Visit (HOSPITAL_COMMUNITY)
Admission: RE | Admit: 2020-04-13 | Discharge: 2020-04-14 | Disposition: A | Discharge status: Home / Self Care | Payer: BC Managed Care – PPO | Attending: Surgery | Admitting: Surgery

## 2020-04-13 ENCOUNTER — Ambulatory Visit (HOSPITAL_COMMUNITY): Payer: BC Managed Care – PPO | Admitting: Anesthesiology

## 2020-04-13 ENCOUNTER — Encounter (HOSPITAL_COMMUNITY)
Admission: RE | Disposition: A | Discharge status: Home / Self Care | Payer: Self-pay | Source: Home / Self Care | Attending: Surgery

## 2020-04-13 ENCOUNTER — Encounter (HOSPITAL_COMMUNITY): Payer: Self-pay | Admitting: Surgery

## 2020-04-13 ENCOUNTER — Other Ambulatory Visit: Payer: Self-pay

## 2020-04-13 DIAGNOSIS — E210 Primary hyperparathyroidism: Secondary | ICD-10-CM | POA: Diagnosis not present

## 2020-04-13 DIAGNOSIS — K8020 Calculus of gallbladder without cholecystitis without obstruction: Secondary | ICD-10-CM | POA: Diagnosis not present

## 2020-04-13 DIAGNOSIS — Z79899 Other long term (current) drug therapy: Secondary | ICD-10-CM | POA: Diagnosis not present

## 2020-04-13 DIAGNOSIS — F17200 Nicotine dependence, unspecified, uncomplicated: Secondary | ICD-10-CM | POA: Insufficient documentation

## 2020-04-13 DIAGNOSIS — Z8379 Family history of other diseases of the digestive system: Secondary | ICD-10-CM | POA: Diagnosis not present

## 2020-04-13 DIAGNOSIS — E559 Vitamin D deficiency, unspecified: Secondary | ICD-10-CM | POA: Diagnosis not present

## 2020-04-13 DIAGNOSIS — Z419 Encounter for procedure for purposes other than remedying health state, unspecified: Secondary | ICD-10-CM

## 2020-04-13 DIAGNOSIS — I10 Essential (primary) hypertension: Secondary | ICD-10-CM | POA: Diagnosis not present

## 2020-04-13 DIAGNOSIS — K8010 Calculus of gallbladder with chronic cholecystitis without obstruction: Secondary | ICD-10-CM | POA: Diagnosis not present

## 2020-04-13 HISTORY — PX: CHOLECYSTECTOMY: SHX55

## 2020-04-13 LAB — PREGNANCY, URINE: Preg Test, Ur: NEGATIVE

## 2020-04-13 SURGERY — LAPAROSCOPIC CHOLECYSTECTOMY WITH INTRAOPERATIVE CHOLANGIOGRAM
Anesthesia: General | Site: Abdomen

## 2020-04-13 MED ORDER — MEPERIDINE HCL 50 MG/ML IJ SOLN: 6.2500 mg | INTRAMUSCULAR | Status: DC | PRN

## 2020-04-13 MED ORDER — PROMETHAZINE HCL 25 MG/ML IJ SOLN
INTRAMUSCULAR | Status: AC
  Filled 2020-04-13: qty 1

## 2020-04-13 MED ORDER — LIDOCAINE HCL (CARDIAC) PF 100 MG/5ML IV SOSY
PREFILLED_SYRINGE | INTRAVENOUS | Status: DC | PRN
  Administered 2020-04-13: 60 mg via INTRAVENOUS

## 2020-04-13 MED ORDER — ACETAMINOPHEN 160 MG/5ML PO SOLN: 325.0000 mg | ORAL | Status: DC | PRN

## 2020-04-13 MED ORDER — TRAMADOL HCL 50 MG PO TABS
50.0000 mg | ORAL_TABLET | Freq: Four times a day (QID) | ORAL | Status: DC | PRN
  Administered 2020-04-13: 50 mg via ORAL
  Filled 2020-04-13: qty 1

## 2020-04-13 MED ORDER — FENTANYL CITRATE (PF) 100 MCG/2ML IJ SOLN
INTRAMUSCULAR | Status: DC | PRN
  Administered 2020-04-13 (×2): 50 ug via INTRAVENOUS
  Administered 2020-04-13: 150 ug via INTRAVENOUS

## 2020-04-13 MED ORDER — BUPIVACAINE-EPINEPHRINE 0.5% -1:200000 IJ SOLN
INTRAMUSCULAR | Status: DC | PRN
  Administered 2020-04-13: 30 mL

## 2020-04-13 MED ORDER — FENTANYL CITRATE (PF) 100 MCG/2ML IJ SOLN
INTRAMUSCULAR | Status: AC
  Filled 2020-04-13: qty 2

## 2020-04-13 MED ORDER — SODIUM CHLORIDE 0.9 % IV SOLN
INTRAVENOUS | Status: DC | PRN
  Administered 2020-04-13: 10 mL

## 2020-04-13 MED ORDER — AMLODIPINE BESYLATE 5 MG PO TABS: 5.0000 mg | ORAL_TABLET | Freq: Every day | ORAL | Status: DC

## 2020-04-13 MED ORDER — MIDAZOLAM HCL 5 MG/5ML IJ SOLN
INTRAMUSCULAR | Status: DC | PRN
  Administered 2020-04-13 (×2): 1 mg via INTRAVENOUS

## 2020-04-13 MED ORDER — LACTATED RINGERS IV SOLN: INTRAVENOUS | Status: DC

## 2020-04-13 MED ORDER — LIDOCAINE HCL (PF) 2 % IJ SOLN
INTRAMUSCULAR | Status: AC
  Filled 2020-04-13: qty 5

## 2020-04-13 MED ORDER — TRAMADOL HCL 50 MG PO TABS: 50.0000 mg | ORAL_TABLET | Freq: Four times a day (QID) | ORAL | 0 refills | Status: DC | PRN

## 2020-04-13 MED ORDER — LOSARTAN POTASSIUM 50 MG PO TABS
100.0000 mg | ORAL_TABLET | Freq: Every day | ORAL | Status: DC
  Administered 2020-04-13: 100 mg via ORAL
  Filled 2020-04-13: qty 2

## 2020-04-13 MED ORDER — ROCURONIUM BROMIDE 100 MG/10ML IV SOLN
INTRAVENOUS | Status: DC | PRN
  Administered 2020-04-13: 70 mg via INTRAVENOUS

## 2020-04-13 MED ORDER — OXYCODONE HCL 5 MG/5ML PO SOLN
5.0000 mg | Freq: Once | ORAL | Status: DC | PRN
Start: 2020-04-13 — End: 2020-04-13

## 2020-04-13 MED ORDER — PROPOFOL 10 MG/ML IV BOLUS
INTRAVENOUS | Status: DC | PRN
  Administered 2020-04-13: 200 mg via INTRAVENOUS

## 2020-04-13 MED ORDER — ACETAMINOPHEN 325 MG PO TABS
650.0000 mg | ORAL_TABLET | Freq: Four times a day (QID) | ORAL | Status: DC | PRN
  Administered 2020-04-14: 650 mg via ORAL
  Filled 2020-04-13: qty 2

## 2020-04-13 MED ORDER — ROCURONIUM BROMIDE 10 MG/ML (PF) SYRINGE
PREFILLED_SYRINGE | INTRAVENOUS | Status: AC
  Filled 2020-04-13: qty 10

## 2020-04-13 MED ORDER — MIDAZOLAM HCL 2 MG/2ML IJ SOLN
INTRAMUSCULAR | Status: AC
  Filled 2020-04-13: qty 2

## 2020-04-13 MED ORDER — KETOROLAC TROMETHAMINE 30 MG/ML IJ SOLN
30.0000 mg | Freq: Once | INTRAMUSCULAR | Status: AC | PRN
  Administered 2020-04-13: 30 mg via INTRAVENOUS

## 2020-04-13 MED ORDER — ONDANSETRON HCL 4 MG/2ML IJ SOLN
INTRAMUSCULAR | Status: DC | PRN
  Administered 2020-04-13: 4 mg via INTRAVENOUS

## 2020-04-13 MED ORDER — ONDANSETRON HCL 4 MG/2ML IJ SOLN: 4.0000 mg | Freq: Four times a day (QID) | INTRAMUSCULAR | Status: DC | PRN

## 2020-04-13 MED ORDER — ONDANSETRON HCL 4 MG/2ML IJ SOLN
INTRAMUSCULAR | Status: AC
  Filled 2020-04-13: qty 2

## 2020-04-13 MED ORDER — ORAL CARE MOUTH RINSE: 15.0000 mL | Freq: Once | OROMUCOSAL | Status: AC

## 2020-04-13 MED ORDER — LABETALOL HCL 5 MG/ML IV SOLN
INTRAVENOUS | Status: AC
  Filled 2020-04-13: qty 4

## 2020-04-13 MED ORDER — SODIUM CHLORIDE 0.45 % IV SOLN: INTRAVENOUS | Status: DC

## 2020-04-13 MED ORDER — FENTANYL CITRATE (PF) 250 MCG/5ML IJ SOLN
INTRAMUSCULAR | Status: AC
  Filled 2020-04-13: qty 5

## 2020-04-13 MED ORDER — LACTATED RINGERS IR SOLN
Status: DC | PRN
  Administered 2020-04-13: 1000 mL

## 2020-04-13 MED ORDER — DEXAMETHASONE SODIUM PHOSPHATE 10 MG/ML IJ SOLN
INTRAMUSCULAR | Status: AC
  Filled 2020-04-13: qty 1

## 2020-04-13 MED ORDER — ONDANSETRON 4 MG PO TBDP: 4.0000 mg | ORAL_TABLET | Freq: Four times a day (QID) | ORAL | Status: DC | PRN

## 2020-04-13 MED ORDER — KETOROLAC TROMETHAMINE 30 MG/ML IJ SOLN
INTRAMUSCULAR | Status: AC
  Filled 2020-04-13: qty 1

## 2020-04-13 MED ORDER — PROPOFOL 10 MG/ML IV BOLUS
INTRAVENOUS | Status: AC
  Filled 2020-04-13: qty 20

## 2020-04-13 MED ORDER — HYDROMORPHONE HCL 1 MG/ML IJ SOLN: 1.0000 mg | INTRAMUSCULAR | Status: DC | PRN

## 2020-04-13 MED ORDER — CEFAZOLIN SODIUM-DEXTROSE 2-4 GM/100ML-% IV SOLN
2.0000 g | INTRAVENOUS | Status: AC
  Administered 2020-04-13: 2 g via INTRAVENOUS
  Filled 2020-04-13: qty 100

## 2020-04-13 MED ORDER — DEXAMETHASONE SODIUM PHOSPHATE 10 MG/ML IJ SOLN
INTRAMUSCULAR | Status: DC | PRN
  Administered 2020-04-13: 5 mg via INTRAVENOUS

## 2020-04-13 MED ORDER — BUPIVACAINE-EPINEPHRINE (PF) 0.5% -1:200000 IJ SOLN
INTRAMUSCULAR | Status: AC
  Filled 2020-04-13: qty 30

## 2020-04-13 MED ORDER — CHLORHEXIDINE GLUCONATE CLOTH 2 % EX PADS: 6.0000 | MEDICATED_PAD | Freq: Once | CUTANEOUS | Status: DC

## 2020-04-13 MED ORDER — 0.9 % SODIUM CHLORIDE (POUR BTL) OPTIME
TOPICAL | Status: DC | PRN
  Administered 2020-04-13: 1000 mL

## 2020-04-13 MED ORDER — GLYCOPYRROLATE PF 0.2 MG/ML IJ SOSY
PREFILLED_SYRINGE | INTRAMUSCULAR | Status: AC
  Filled 2020-04-13: qty 1

## 2020-04-13 MED ORDER — CHLORHEXIDINE GLUCONATE 0.12 % MT SOLN
15.0000 mL | Freq: Once | OROMUCOSAL | Status: AC
  Administered 2020-04-13: 15 mL via OROMUCOSAL

## 2020-04-13 MED ORDER — GLYCOPYRROLATE 0.2 MG/ML IJ SOLN
INTRAMUSCULAR | Status: DC | PRN
  Administered 2020-04-13: .1 mg via INTRAVENOUS

## 2020-04-13 MED ORDER — OXYCODONE HCL 5 MG PO TABS: 5.0000 mg | ORAL_TABLET | Freq: Once | ORAL | Status: DC | PRN

## 2020-04-13 MED ORDER — PROMETHAZINE HCL 25 MG/ML IJ SOLN
6.2500 mg | INTRAMUSCULAR | Status: DC | PRN
  Administered 2020-04-13: 6.25 mg via INTRAVENOUS

## 2020-04-13 MED ORDER — FENTANYL CITRATE (PF) 100 MCG/2ML IJ SOLN
25.0000 ug | INTRAMUSCULAR | Status: DC | PRN
  Administered 2020-04-13: 25 ug via INTRAVENOUS

## 2020-04-13 MED ORDER — ACETAMINOPHEN 650 MG RE SUPP: 650.0000 mg | Freq: Four times a day (QID) | RECTAL | Status: DC | PRN

## 2020-04-13 MED ORDER — SUGAMMADEX SODIUM 200 MG/2ML IV SOLN
INTRAVENOUS | Status: DC | PRN
  Administered 2020-04-13: 200 mg via INTRAVENOUS

## 2020-04-13 MED ORDER — EPHEDRINE SULFATE 50 MG/ML IJ SOLN
INTRAMUSCULAR | Status: DC | PRN
  Administered 2020-04-13: 10 mg via INTRAVENOUS

## 2020-04-13 MED ORDER — LABETALOL HCL 5 MG/ML IV SOLN
INTRAVENOUS | Status: DC | PRN
  Administered 2020-04-13: 4 mg via INTRAVENOUS

## 2020-04-13 MED ORDER — ACETAMINOPHEN 325 MG PO TABS: 325.0000 mg | ORAL_TABLET | ORAL | Status: DC | PRN

## 2020-04-13 MED ORDER — OXYCODONE HCL 5 MG PO TABS: 5.0000 mg | ORAL_TABLET | ORAL | Status: DC | PRN

## 2020-04-13 SURGICAL SUPPLY — 34 items
APPLIER CLIP ROT 10 11.4 M/L (STAPLE) ×2
CABLE HIGH FREQUENCY MONO STRZ (ELECTRODE) ×2 IMPLANT
CHLORAPREP W/TINT 26 (MISCELLANEOUS) ×4 IMPLANT
CLIP APPLIE ROT 10 11.4 M/L (STAPLE) ×1 IMPLANT
COVER MAYO STAND STRL (DRAPES) ×2 IMPLANT
COVER SURGICAL LIGHT HANDLE (MISCELLANEOUS) ×2 IMPLANT
COVER WAND RF STERILE (DRAPES) IMPLANT
DECANTER SPIKE VIAL GLASS SM (MISCELLANEOUS) ×2 IMPLANT
DERMABOND ADVANCED (GAUZE/BANDAGES/DRESSINGS)
DERMABOND ADVANCED .7 DNX12 (GAUZE/BANDAGES/DRESSINGS) IMPLANT
DRAPE C-ARM 42X120 X-RAY (DRAPES) ×2 IMPLANT
ELECT REM PT RETURN 15FT ADLT (MISCELLANEOUS) ×2 IMPLANT
GAUZE SPONGE 2X2 8PLY STRL LF (GAUZE/BANDAGES/DRESSINGS) ×1 IMPLANT
GLOVE SURG ORTHO 8.0 STRL STRW (GLOVE) ×2 IMPLANT
GOWN STRL REUS W/TWL XL LVL3 (GOWN DISPOSABLE) ×4 IMPLANT
HEMOSTAT SURGICEL 4X8 (HEMOSTASIS) IMPLANT
KIT BASIN OR (CUSTOM PROCEDURE TRAY) ×2 IMPLANT
KIT TURNOVER KIT A (KITS) IMPLANT
PENCIL SMOKE EVACUATOR (MISCELLANEOUS) IMPLANT
POUCH SPECIMEN RETRIEVAL 10MM (ENDOMECHANICALS) ×2 IMPLANT
SCISSORS LAP 5X35 DISP (ENDOMECHANICALS) ×2 IMPLANT
SET CHOLANGIOGRAPH MIX (MISCELLANEOUS) ×2 IMPLANT
SET IRRIG TUBING LAPAROSCOPIC (IRRIGATION / IRRIGATOR) ×2 IMPLANT
SET TUBE SMOKE EVAC HIGH FLOW (TUBING) IMPLANT
SLEEVE XCEL OPT CAN 5 100 (ENDOMECHANICALS) ×2 IMPLANT
SPONGE GAUZE 2X2 STER 10/PKG (GAUZE/BANDAGES/DRESSINGS) ×1
STRIP CLOSURE SKIN 1/2X4 (GAUZE/BANDAGES/DRESSINGS) IMPLANT
SUT MNCRL AB 4-0 PS2 18 (SUTURE) ×2 IMPLANT
TOWEL OR 17X26 10 PK STRL BLUE (TOWEL DISPOSABLE) ×2 IMPLANT
TOWEL OR NON WOVEN STRL DISP B (DISPOSABLE) ×2 IMPLANT
TRAY LAPAROSCOPIC (CUSTOM PROCEDURE TRAY) ×2 IMPLANT
TROCAR BLADELESS OPT 5 100 (ENDOMECHANICALS) ×2 IMPLANT
TROCAR XCEL BLUNT TIP 100MML (ENDOMECHANICALS) ×2 IMPLANT
TROCAR XCEL NON-BLD 11X100MML (ENDOMECHANICALS) ×2 IMPLANT

## 2020-04-13 NOTE — Anesthesia Procedure Notes (Signed)

## 2020-04-13 NOTE — Anesthesia Postprocedure Evaluation (Signed)
Anesthesia Post Note  Patient: Renee [name]  Procedure(s) Performed: LAPAROSCOPIC CHOLECYSTECTOMY WITH INTRAOPERATIVE CHOLANGIOGRAM (N/A Abdomen)     Patient location during evaluation: PACU Anesthesia Type: General Level of consciousness: awake and sedated Pain management: pain level controlled Vital Signs Assessment: post-procedure vital signs reviewed and stable Respiratory status: spontaneous breathing Cardiovascular status: stable Postop Assessment: no apparent nausea or vomiting Anesthetic complications: no   No complications documented.  Last Vitals:  Vitals:   04/13/20 1105 04/13/20 1200  BP: 138/82 (!) 144/85  Pulse: 80 85  Resp: 18 18  Temp: 36.7 C   SpO2: 100% 100%    Last Pain:  Vitals:   04/13/20 1200  TempSrc:   PainSc: Asleep   Pain Goal: Patients Stated Pain Goal: 2 (04/13/20 1122)                 Caren Macadam

## 2020-04-13 NOTE — Anesthesia Postprocedure Evaluation (Signed)
Anesthesia Post Note  Patient: Renee [name]  Procedure(s) Performed: LAPAROSCOPIC CHOLECYSTECTOMY WITH INTRAOPERATIVE CHOLANGIOGRAM (N/A Abdomen)     Patient location during evaluation: PACU Anesthesia Type: General Level of consciousness: awake and sedated Pain management: pain level controlled Vital Signs Assessment: post-procedure vital signs reviewed and stable Respiratory status: spontaneous breathing Cardiovascular status: stable Postop Assessment: no headache Anesthetic complications: yes (PONV)   No complications documented.  Last Vitals:  Vitals:   04/13/20 1105 04/13/20 1200  BP: 138/82 (!) 144/85  Pulse: 80 85  Resp: 18 18  Temp: 36.7 C   SpO2: 100% 100%    Last Pain:  Vitals:   04/13/20 1200  TempSrc:   PainSc: Asleep   Pain Goal: Patients Stated Pain Goal: 2 (04/13/20 1122)                 Caren Macadam

## 2020-04-13 NOTE — Transfer of Care (Signed)
Immediate Anesthesia Transfer of Care Note  Patient: Renee [name]  Procedure(s) Performed: LAPAROSCOPIC CHOLECYSTECTOMY WITH INTRAOPERATIVE CHOLANGIOGRAM (N/A Abdomen)  Patient Location: PACU  Anesthesia Type:General  Level of Consciousness: awake, alert , oriented and patient cooperative  Airway & Oxygen Therapy: Patient Spontanous Breathing and Patient connected to face mask oxygen  Post-op Assessment: Report given to RN and Post -op Vital signs reviewed and stable  Post vital signs: Reviewed and stable  Last Vitals:  Vitals Value Taken Time  BP 139/99 04/13/20 0951  Temp    Pulse 71 04/13/20 0953  Resp 13 04/13/20 0953  SpO2 100 % 04/13/20 0953  Vitals shown include unvalidated device data.  Last Pain:  Vitals:   04/13/20 0545  TempSrc: Oral      Patients Stated Pain Goal: 4 (04/13/20 0555)  Complications: No complications documented.

## 2020-04-13 NOTE — Interval H&P Note (Signed)
History and Physical Interval Note:  04/13/2020 8:08 AM  Renee [name]  has presented today for surgery, with the diagnosis of CHRONIC CHOLELITHIASIS.  The various methods of treatment have been discussed with the patient and family. After consideration of risks, benefits and other options for treatment, the patient has consented to    Procedure(s): LAPAROSCOPIC CHOLECYSTECTOMY WITH INTRAOPERATIVE CHOLANGIOGRAM (N/A) as a surgical intervention.    The patient's history has been reviewed, patient examined, no change in status, stable for surgery.  I have reviewed the patient's chart and labs.  Questions were answered to the patient's satisfaction.    Darnell Level, MD [facility] Office: [phone]   Darnell Level

## 2020-04-13 NOTE — Op Note (Signed)
Procedure Note  Pre-operative Diagnosis:  Chronic cholecystitis, cholelithiasis  Post-operative Diagnosis:  same  Surgeon:  Darnell Level, MD  Assistant:  none   Procedure:  Laparoscopic cholecystectomy with intra-operative cholangiography  Anesthesia:  General  Estimated Blood Loss:  minimal  Drains: none         Specimen: gallbladder to pathology  Indications:  Patient is referred by Dr. Dorisann Frames for surgical evaluation of symptomatic cholelithiasis as well as primary hyperparathyroidism. Patient's primary care physician is Dr. Lenise Herald in [place], [place]. Patient has approximately a five-year history of intermittent right upper quadrant abdominal pain associated with nausea. This had occurred approximately every 3 months but recently has become more frequent and more severe. Patient denies fevers or chills. She denies jaundice or acholic stools. She has had no prior abdominal surgery. She does have a family history of gallbladder disease and multiple female relatives. Patient has no prior history of hepatobiliary or pancreatic disease. Patient underwent an abdominal ultrasound on January 27, 2020. This demonstrated echogenic sludge and shadowing stones measuring up to 1.4 cm in size. There was no sign of biliary dilatation. Patient presents today for cholecystectomy.  Procedure Details:  The patient was seen in the pre-op holding area. The risks, benefits, complications, treatment options, and expected outcomes were previously discussed with the patient. The patient agreed with the proposed plan and has signed the informed consent form.  The patient was transported to operating room #4 at the [facility]. The patient was placed in the supine position on the operating room table. Following induction of general anesthesia, the abdomen was prepped and draped in the usual aseptic fashion.  An incision was made in the skin near the umbilicus. The  midline fascia was incised and the peritoneal cavity was entered and a Hasson cannula was introduced under direct vision. The cannula was secured with a 0-Vicryl pursestring suture. Pneumoperitoneum was established with carbon dioxide. Additional cannulae were introduced under direct vision along the right costal margin in the midline, mid-clavicular line, and anterior axillary line.   The gallbladder was identified and the fundus grasped and retracted cephalad. Adhesions were taken down bluntly and the electrocautery was utilized as needed, taking care not to involve any adjacent structures. The infundibulum was grasped and retracted laterally, exposing the peritoneum overlying the triangle of Calot. The peritoneum was incised and structures exposed with blunt dissection. The cystic duct was clearly identified, bluntly dissected circumferentially, and clipped at the neck of the gallbladder.  An incision was made in the cystic duct and the cholangiogram catheter introduced. The catheter was secured using an ligaclip.  Real-time cholangiography was performed using C-arm fluoroscopy.  There was rapid filling of a normal caliber common bile duct.  There was reflux of contrast into the left and right hepatic ductal systems.  There was free flow distally into the duodenum without filling defect or obstruction.  The catheter was removed from the peritoneal cavity.  The cystic duct was then ligated with ligaclips and divided. The cystic artery was identified, dissected circumferentially, ligated with ligaclips, and divided.  The gallbladder was dissected away from the gallbladder bed using the electrocautery for hemostasis. The gallbladder was completely removed from the liver and placed into an endocatch bag. The gallbladder was removed in the endocatch bag through the umbilical port site and submitted to pathology for review.  The right upper quadrant was irrigated and the gallbladder bed was inspected.  Hemostasis was achieved with the electrocautery.  Cannulae were removed  under direct vision and good hemostasis was noted. Pneumoperitoneum was released and the majority of the carbon dioxide evacuated. The umbilical wound was irrigated and the fascia was then closed with the pursestring suture.  Local anesthetic was infiltrated at all port sites. Skin incisions were closed with 4-0 Monocril subcuticular sutures and Dermabond was applied.  Instrument, sponge, and needle counts were correct at the conclusion of the case.  The patient was awakened from anesthesia and brought to the recovery room in stable condition.  The patient tolerated the procedure well.   Armandina Gemma, MD [facility] Office: [phone]

## 2020-04-13 NOTE — Discharge Instructions (Signed)
[facility]  LAPAROSCOPIC SURGERY:  POST-OP INSTRUCTIONS  Always review your discharge instruction sheet given to you by the facility where your surgery was performed.  A prescription for pain medication may be given to you upon discharge.  Take your pain medication as prescribed.  If narcotic pain medicine is not needed, then you may take acetaminophen (Tylenol) or ibuprofen (Advil) as needed.  Take your usually prescribed medications unless otherwise directed.  If you need a refill on your pain medication, please contact your pharmacy.  They will contact our office to request authorization. Prescriptions will not be filled after 5 P.M. or on weekends.  You should follow a light diet the first few days after arrival home, such as soup and crackers or toast.  Be sure to include plenty of fluids daily.  Most patients will experience some swelling and bruising in the area of the incisions.  Ice packs will help.  Swelling and bruising can take several days to resolve.   It is common to experience some constipation after surgery.  Increasing fluid intake and taking a stool softener (such as Colace) will usually help or prevent this problem from occurring.  A mild laxative (Milk of Magnesia or Miralax) should be taken according to package instructions if there has been no bowel movement after 48 hours.  You will likely have Dermabond (topical glue) over your incisions.  This seals the incisions and allows you to bathe and shower at any time after your surgery.  Glue should remain in place for up to 10 days.  It may be removed after 10 days by pealing off the Dermabond material or using Vaseline or naval jelly to remove.  If you have steri-strips over your incisions, you may remove the gauze bandage on the second day after surgery, and you may shower at that time.  Leave your steri-strips (small skin tapes) in place directly over the incision.  These strips should remain on the  skin for 5-7 days and then be removed.  You may get them wet in the shower and pat them dry.  Any sutures or staples will be removed at the office during your follow-up visit.  ACTIVITIES:  You may resume regular (light) daily activities beginning the next day - such as daily self-care, walking, climbing stairs - gradually increasing activities as tolerated.  You may have sexual intercourse when it is comfortable.  Refrain from any heavy lifting or straining until approved by your doctor.  You may drive when you are no longer taking prescription pain medication, when you can comfortably wear a seatbelt, and when you can safely maneuver your car and apply brakes.  You should see your doctor in the office for a follow-up appointment approximately 2-3 weeks after your surgery.  Make sure that you call for this appointment within a day or two after you arrive home to insure a convenient appointment time.  WHEN TO CALL YOUR DOCTOR: 1. Fever over 101.0 2. Inability to urinate 3. Continued bleeding from incision 4. Increased pain, redness, or drainage from the incision 5. Increasing abdominal pain  The clinic staff is available to answer your questions during regular business hours.  Please don't hesitate to call and ask to speak to one of the nurses for clinical concerns.  If you have a medical emergency, go to the nearest emergency room or call 911.  A surgeon from [facility] is always on call for the hospital.  Jacilyn Sanpedro M. Bladyn Tipps, MD, FACS [facility] Office: [phone] Toll Free:  [phone] FAX [phone]  Website: [email or web address] 

## 2020-04-14 ENCOUNTER — Ambulatory Visit (HOSPITAL_COMMUNITY): Payer: BC Managed Care – PPO

## 2020-04-14 ENCOUNTER — Encounter (HOSPITAL_COMMUNITY): Payer: BC Managed Care – PPO

## 2020-04-14 ENCOUNTER — Encounter (HOSPITAL_COMMUNITY): Payer: Self-pay | Admitting: Surgery

## 2020-04-14 ENCOUNTER — Encounter (HOSPITAL_COMMUNITY): Payer: Self-pay

## 2020-04-14 DIAGNOSIS — E210 Primary hyperparathyroidism: Secondary | ICD-10-CM | POA: Diagnosis not present

## 2020-04-14 DIAGNOSIS — Z8379 Family history of other diseases of the digestive system: Secondary | ICD-10-CM | POA: Diagnosis not present

## 2020-04-14 DIAGNOSIS — E559 Vitamin D deficiency, unspecified: Secondary | ICD-10-CM | POA: Diagnosis not present

## 2020-04-14 DIAGNOSIS — K8010 Calculus of gallbladder with chronic cholecystitis without obstruction: Secondary | ICD-10-CM | POA: Diagnosis not present

## 2020-04-14 DIAGNOSIS — F17200 Nicotine dependence, unspecified, uncomplicated: Secondary | ICD-10-CM | POA: Diagnosis not present

## 2020-04-14 DIAGNOSIS — Z79899 Other long term (current) drug therapy: Secondary | ICD-10-CM | POA: Diagnosis not present

## 2020-04-14 LAB — SURGICAL PATHOLOGY

## 2020-04-14 NOTE — Discharge Summary (Signed)
Physician Discharge Summary [facility]  Patient ID: Renee [name] MRN: [id number] DOB/AGE: June 22, 1980 40 y.o.  Admit date: 04/13/2020  Discharge date: 04/14/2020  Discharge Diagnoses:  Principal Problem:   Cholelithiasis with cholecystitis Active Problems:   Hyperparathyroidism, primary (HCC)   Cholelithiasis with chronic cholecystitis   Discharged Condition: good  Hospital Course: Patient was admitted for observation following gallbladder surgery.  Post op course was uncomplicated.  Pain was well controlled.  Tolerated diet.  Patient was prepared for discharge home on POD#1.  Consults: None  Treatments: surgery: lap chole with IOC  Discharge Exam: Blood pressure (!) 157/112, pulse 73, temperature 98.7 F (37.1 C), temperature source Oral, resp. rate 16, height 5\' 1"  (1.549 m), weight 104.3 kg, SpO2 92 %. HEENT - clear Neck - soft Chest - clear bilaterally Cor - RRR Abd - soft without distension; wounds dry and intact with Dermabond  Disposition: Home  Discharge Instructions    Diet - low sodium heart healthy   Complete by: As directed    Discharge instructions   Complete by: As directed    [facility]  LAPAROSCOPIC SURGERY:  POST-OP INSTRUCTIONS  Always review your discharge instruction sheet given to you by the facility where your surgery was performed.  A prescription for pain medication may be given to you upon discharge.  Take your pain medication as prescribed.  If narcotic pain medicine is not needed, then you may take acetaminophen (Tylenol) or ibuprofen (Advil) as needed.  Take your usually prescribed medications unless otherwise directed.  If you need a refill on your pain medication, please contact your pharmacy.  They will contact our office to request authorization. Prescriptions will not be filled after 5 P.M. or on weekends.  You should follow a light diet the first few days after arrival home, such as soup  and crackers or toast.  Be sure to include plenty of fluids daily.  Most patients will experience some swelling and bruising in the area of the incisions.  Ice packs will help.  Swelling and bruising can take several days to resolve.   It is common to experience some constipation after surgery.  Increasing fluid intake and taking a stool softener (such as Colace) will usually help or prevent this problem from occurring.  A mild laxative (Milk of Magnesia or Miralax) should be taken according to package instructions if there has been no bowel movement after 48 hours.  You will likely have Dermabond (topical glue) over your incisions.  This seals the incisions and allows you to bathe and shower at any time after your surgery.  Glue should remain in place for up to 10 days.  It may be removed after 10 days by pealing off the Dermabond material or using Vaseline or naval jelly to remove.  If you have steri-strips over your incisions, you may remove the gauze bandage on the second day after surgery, and you may shower at that time.  Leave your steri-strips (small skin tapes) in place directly over the incision.  These strips should remain on the skin for 5-7 days and then be removed.  You may get them wet in the shower and pat them dry.  Any sutures or staples will be removed at the office during your follow-up visit.  ACTIVITIES:  You may resume regular (light) daily activities beginning the next day - such as daily self-care, walking, climbing stairs - gradually increasing activities as tolerated.  You may have sexual  intercourse when it is comfortable.  Refrain from any heavy lifting or straining until approved by your doctor.  You may drive when you are no longer taking prescription pain medication, when you can comfortably wear a seatbelt, and when you can safely maneuver your car and apply brakes.  You should see your doctor in the office for a follow-up appointment approximately 2-3 weeks after  your surgery.  Make sure that you call for this appointment within a day or two after you arrive home to insure a convenient appointment time.  WHEN TO CALL YOUR DOCTOR: Fever over 101.0 Inability to urinate Continued bleeding from incision Increased pain, redness, or drainage from the incision Increasing abdominal pain  The clinic staff is available to answer your questions during regular business hours.  Please don't hesitate to call and ask to speak to one of the nurses for clinical concerns.  If you have a medical emergency, go to the nearest emergency room or call 911.  A surgeon from [facility] is always on call for the hospital.  Velora Heckler, MD, [facility] Office: [phone] Toll Free:  [phone] FAX [phone]  Website: [email or web address]   Increase activity slowly   Complete by: As directed    No dressing needed   Complete by: As directed      Allergies as of 04/14/2020   No Known Allergies     Medication List    TAKE these medications   amLODipine 5 MG tablet Commonly known as: NORVASC Take 5 mg by mouth daily.   benzonatate 100 MG capsule Commonly known as: TESSALON Take 1 capsule (100 mg total) by mouth 3 (three) times daily as needed for cough.   cetirizine 10 MG tablet Commonly known as: ZyrTEC Allergy Take 1 tablet (10 mg total) by mouth daily.   Dialyvite Vitamin D 5000 125 MCG (5000 UT) capsule Generic drug: Cholecalciferol Take 5,000 Units by mouth daily.   famotidine 20 MG tablet Commonly known as: PEPCID Take 1 tablet (20 mg total) by mouth 2 (two) times daily.   fluticasone 50 MCG/ACT nasal spray Commonly known as: FLONASE Place 1 spray into both nostrils daily for 14 days.   lisinopril 10 MG tablet Commonly known as: ZESTRIL Take 1 tablet (10 mg total) by mouth daily.   losartan 100 MG tablet Commonly known as: COZAAR Take 100 mg by mouth daily.   traMADol 50 MG  tablet Commonly known as: ULTRAM Take 1-2 tablets (50-100 mg total) by mouth every 6 (six) hours as needed.            Discharge Care Instructions  (From admission, onward)         Start     Ordered   04/14/20 0000  No dressing needed        04/14/20 7846          Follow-up Information    Darnell Level, MD. Schedule an appointment as soon as possible for a visit in 3 weeks.   Specialty: General Surgery Why: For wound re-check Contact information: [address] [place] [address] [phone]               Darnell Level, MD [facility] Office: [phone]   Signed: Darnell Level 04/14/2020, 7:54 AM

## 2020-04-14 NOTE — Plan of Care (Signed)

## 2020-05-11 ENCOUNTER — Ambulatory Visit (HOSPITAL_COMMUNITY): Payer: BC Managed Care – PPO

## 2020-05-19 ENCOUNTER — Other Ambulatory Visit: Payer: Self-pay

## 2020-05-19 ENCOUNTER — Ambulatory Visit (HOSPITAL_COMMUNITY)
Admission: RE | Admit: 2020-05-19 | Discharge: 2020-05-19 | Disposition: A | Discharge status: Home / Self Care | Payer: BC Managed Care – PPO | Source: Ambulatory Visit | Attending: Surgery | Admitting: Surgery

## 2020-05-19 ENCOUNTER — Encounter (HOSPITAL_COMMUNITY)
Admission: RE | Admit: 2020-05-19 | Discharge: 2020-05-19 | Disposition: A | Discharge status: Home / Self Care | Payer: BC Managed Care – PPO | Source: Ambulatory Visit | Attending: Endocrinology | Admitting: Endocrinology

## 2020-05-19 DIAGNOSIS — E041 Nontoxic single thyroid nodule: Secondary | ICD-10-CM | POA: Diagnosis not present

## 2020-05-19 DIAGNOSIS — E213 Hyperparathyroidism, unspecified: Secondary | ICD-10-CM | POA: Diagnosis not present

## 2020-05-19 DIAGNOSIS — E210 Primary hyperparathyroidism: Secondary | ICD-10-CM

## 2020-05-19 MED ORDER — TECHNETIUM TC 99M SESTAMIBI - CARDIOLITE
26.7000 | Freq: Once | INTRAVENOUS | Status: AC | PRN
  Administered 2020-05-19: 26.7 via INTRAVENOUS

## 2020-05-27 ENCOUNTER — Other Ambulatory Visit: Payer: Self-pay | Admitting: Surgery

## 2020-05-27 DIAGNOSIS — E210 Primary hyperparathyroidism: Secondary | ICD-10-CM

## 2020-06-10 ENCOUNTER — Other Ambulatory Visit: Payer: BC Managed Care – PPO

## 2020-07-09 DIAGNOSIS — Z681 Body mass index (BMI) 19 or less, adult: Secondary | ICD-10-CM | POA: Diagnosis not present

## 2020-07-09 DIAGNOSIS — J040 Acute laryngitis: Secondary | ICD-10-CM | POA: Diagnosis not present

## 2020-08-04 ENCOUNTER — Inpatient Hospital Stay: Admission: RE | Admit: 2020-08-04 | Payer: BC Managed Care – PPO | Source: Ambulatory Visit

## 2020-10-23 ENCOUNTER — Encounter: Payer: Self-pay | Admitting: Emergency Medicine

## 2020-10-23 ENCOUNTER — Ambulatory Visit
Admission: EM | Admit: 2020-10-23 | Discharge: 2020-10-23 | Disposition: A | Discharge status: Home / Self Care | Payer: BC Managed Care – PPO | Attending: Emergency Medicine | Admitting: Emergency Medicine

## 2020-10-23 ENCOUNTER — Other Ambulatory Visit: Payer: Self-pay

## 2020-10-23 DIAGNOSIS — R200 Anesthesia of skin: Secondary | ICD-10-CM

## 2020-10-23 DIAGNOSIS — R202 Paresthesia of skin: Secondary | ICD-10-CM | POA: Diagnosis not present

## 2020-10-23 MED ORDER — PREDNISONE 10 MG (21) PO TBPK: ORAL_TABLET | Freq: Every day | ORAL | 0 refills | Status: DC

## 2020-10-23 NOTE — ED Provider Notes (Signed)
[facility]   [id number] 10/23/20 Arrival Time: 1342  CC: RT hand PAIN  SUBJECTIVE: History from: patient. Renee [name] is a 40 y.o. female complains of RT hand numbness x  3 days.  Denies a precipitating event or specific injury. Admits to repetitive activities at work with RT hand.  Localizes the pain to the fingers.  Describes as numbness.  Denies alleviating factors.  Symptoms are made worse with making a fist.  Denies previous symptoms.  Denies fever, chills, erythema, ecchymosis, effusion, weakness.      ROS: As per HPI.  All other pertinent ROS negative.     Past Medical History:  Diagnosis Date   Herpes simplex virus (HSV) infection    no OB +HSV 05/04/16 at [facility]    Hypertension    Hypothyroidism    Vaginal Pap smear, abnormal    Past Surgical History:  Procedure Laterality Date   CHOLECYSTECTOMY N/A 04/13/2020   Procedure: LAPAROSCOPIC CHOLECYSTECTOMY WITH INTRAOPERATIVE CHOLANGIOGRAM;  Surgeon: Darnell Level, MD;  Location: [facility];  Service: General;  Laterality: N/A;   KNEE SURGERY     TONSILLECTOMY     No Known Allergies No current facility-administered medications on file prior to encounter.   Current Outpatient Medications on File Prior to Encounter  Medication Sig Dispense Refill   amLODipine (NORVASC) 5 MG tablet Take 5 mg by mouth daily.     Cholecalciferol (DIALYVITE VITAMIN D 5000) 125 MCG (5000 UT) capsule Take 5,000 Units by mouth daily.     losartan (COZAAR) 100 MG tablet Take 100 mg by mouth daily.     traMADol (ULTRAM) 50 MG tablet Take 1-2 tablets (50-100 mg total) by mouth every 6 (six) hours as needed. 15 tablet 0   Social History   Socioeconomic History   Marital status: Single    Spouse name: Not on file   Number of children: Not on file   Years of education: Not on file   Highest education level: Not on file  Occupational History   Not on file  Tobacco Use   Smoking status: Every Day    Packs/day: 0.25    Years: 17.00     Pack years: 4.25    Types: Cigarettes   Smokeless tobacco: Never   Tobacco comments:    smokes 2-3 cig daily  Vaping Use   Vaping Use: Never used  Substance and Sexual Activity   Alcohol use: Yes    Comment: socially   Drug use: No   Sexual activity: Yes    Birth control/protection: None  Other Topics Concern   Not on file  Social History Narrative   Not on file   Social Determinants of Health   Financial Resource Strain: Not on file  Food Insecurity: Not on file  Transportation Needs: Not on file  Physical Activity: Not on file  Stress: Not on file  Social Connections: Not on file  Intimate Partner Violence: Not on file   Family History  Problem Relation Age of Onset   Congestive Heart Failure Maternal Grandmother    Hypertension Maternal Grandmother    Diabetes Maternal Grandmother    Other Maternal Grandfather        aneursym   Kidney disease Maternal Grandfather        on dialysis   Congestive Heart Failure Maternal Grandfather    Diabetes Maternal Grandfather     OBJECTIVE:  Vitals:   10/23/20 1410  BP: 137/86  Pulse: 81  Resp: 16  Temp: 98.3  F (36.8 C)  TempSrc: Oral  SpO2: 98%    General appearance: ALERT; in no acute distress.  Head: NCAT Lungs: Normal respiratory effort CV: radial pulse 2+ Musculoskeletal: RT hand Inspection: Skin warm, dry, clear and intact without obvious erythema, effusion, or ecchymosis.  Palpation: Nontender to palpation ROM: FROM active and passive Skin: warm and dry Neurologic: Ambulates without difficulty Psychological: alert and cooperative; normal mood and affect  ASSESSMENT & PLAN:  1. Numbness and tingling in right hand     Meds ordered this encounter  Medications   predniSONE (STERAPRED UNI-PAK 21 TAB) 10 MG (21) TBPK tablet    Sig: Take by mouth daily. Take 6 tabs by mouth daily  for 2 days, then 5 tabs for 2 days, then 4 tabs for 2 days, then 3 tabs for 2 days, 2 tabs for 2 days, then 1 tab by mouth  daily for 2 days    Dispense:  42 tablet    Refill:  0    Order Specific Question:   Supervising Provider    Answer:   Eustace Moore [[id number]]   Ace bandage applied Continue conservative management of rest, ice, and gentle stretches Steroid prescribed.  Take as directed and to completion Follow up with PCP if symptoms persist Return or go to the ER if you have any new or worsening symptoms (fever, chills, chest pain, redness, swelling, bruising, etc...)   Reviewed expectations re: course of current medical issues. Questions answered. Outlined signs and symptoms indicating need for more acute intervention. Patient verbalized understanding. After Visit Summary given.     Rennis Harding, PA-C 10/23/20 1431

## 2020-10-23 NOTE — ED Triage Notes (Signed)
Woke up Tuesday with right hand numbness.  States on Wednesday, she was unable to make a fist.

## 2020-10-23 NOTE — Discharge Instructions (Addendum)
Ace bandage applied Continue conservative management of rest, ice, and gentle stretches Steroid prescribed.  Take as directed and to completion Follow up with PCP if symptoms persist Return or go to the ER if you have any new or worsening symptoms (fever, chills, chest pain, redness, swelling, bruising, etc...)

## 2020-11-25 ENCOUNTER — Encounter: Payer: Self-pay | Admitting: Orthopedic Surgery

## 2020-11-25 ENCOUNTER — Ambulatory Visit (INDEPENDENT_AMBULATORY_CARE_PROVIDER_SITE_OTHER): Payer: BC Managed Care – PPO | Admitting: Orthopedic Surgery

## 2020-11-25 ENCOUNTER — Other Ambulatory Visit: Payer: Self-pay

## 2020-11-25 VITALS — BP 178/114 | HR 81 | Ht 62.0 in | Wt 248.2 lb

## 2020-11-25 DIAGNOSIS — G5601 Carpal tunnel syndrome, right upper limb: Secondary | ICD-10-CM

## 2020-11-25 NOTE — Patient Instructions (Signed)
Continue light duty at work for the next 4 weeks - please provide a letter.

## 2020-11-25 NOTE — Progress Notes (Signed)
New Patient Visit  Assessment: Renee [name] is a 40 y.o. female with the following: 1. Carpal tunnel syndrome of right wrist  Plan: Pain, numbness, tingling and onset of her symptoms in the right hand are all consistent with carpal tunnel syndrome.  Some of her symptoms have improved with work modifications.  She notes that she wakes up in the middle of the night with some numbness and tingling as well.  As a result, I recommended a cock-up wrist splint to be worn at nighttime only.  In addition, I think it is reasonable for her to continue with light duty at work for the next month or so, which should allow the median nerve irritation to improve.  If she continues to have symptoms shooting pains, numbness and tingling in the right hand, median nerve distribution, we could consider obtaining EMG for further evaluation.  It would also be reasonable to consider proceeding with surgery, if her symptoms are bothering her sufficiently.   Follow-up: Return if symptoms worsen or fail to improve.  Subjective:  Chief Complaint  Patient presents with   New Patient (Initial Visit)   Hand Pain    Right hand//pain & numbness x 3 weeks// work injury 3 weeks ago//workers comp denied//    History of Present Illness: Renee [name] is a 40 y.o. female who presents for evaluation of right hand pain, numbness and tingling.  The symptoms first started approximately 3-4 weeks ago.  She notes repetitive motions at work.  Specifically, recent job caused the symptoms to start.  She has been using a brace while at work.  She wakes up in the middle the night with numbness and tingling in her right hand.  She has noticed some mild improvement since shifting to lighter duty at work.  States he has numbness and tingling to all fingers in her hand.  This is never happened to her before.  She did try some ibuprofen, with limited relief in her symptoms.   Review of Systems: No fevers or chills + numbness and  tingling No chest pain No shortness of breath No bowel or bladder dysfunction No GI distress No headaches   Medical History:  Past Medical History:  Diagnosis Date   Herpes simplex virus (HSV) infection    no OB +HSV 05/04/16 at [facility]    Hypertension    Hypothyroidism    Vaginal Pap smear, abnormal     Past Surgical History:  Procedure Laterality Date   CHOLECYSTECTOMY N/A 04/13/2020   Procedure: LAPAROSCOPIC CHOLECYSTECTOMY WITH INTRAOPERATIVE CHOLANGIOGRAM;  Surgeon: Darnell Level, MD;  Location: [facility];  Service: General;  Laterality: N/A;   KNEE SURGERY     TONSILLECTOMY      Family History  Problem Relation Age of Onset   Congestive Heart Failure Maternal Grandmother    Hypertension Maternal Grandmother    Diabetes Maternal Grandmother    Other Maternal Grandfather        aneursym   Kidney disease Maternal Grandfather        on dialysis   Congestive Heart Failure Maternal Grandfather    Diabetes Maternal Grandfather    Social History   Tobacco Use   Smoking status: Every Day    Packs/day: 0.25    Years: 17.00    Pack years: 4.25    Types: Cigarettes   Smokeless tobacco: Never   Tobacco comments:    smokes 2-3 cig daily  Vaping Use   Vaping Use: Never used  Substance Use Topics  Alcohol use: Yes    Comment: socially   Drug use: No    No Known Allergies  Current Meds  Medication Sig   amLODipine (NORVASC) 5 MG tablet Take 5 mg by mouth daily.   Cholecalciferol (DIALYVITE VITAMIN D 5000) 125 MCG (5000 UT) capsule Take 5,000 Units by mouth daily.   losartan (COZAAR) 100 MG tablet Take 100 mg by mouth daily.    Objective: BP (!) 178/114   Pulse 81   Ht 5\' 2"  (1.575 m)   Wt 248 lb 3.2 oz (112.6 kg)   BMI 45.40 kg/m   Physical Exam:  General: Alert and oriented. and No acute distress. Gait: Normal gait.  Evaluation of right hand demonstrates no deformity.  No atrophy within the thenar eminence.  Sensation intact throughout the right hand.   Mildly positive Tinel's at the carpal tunnel.  Positive carpal tunnel compression test.  Positive Phalen's.  Grip strength is 4+/5, and she is attempted.  Fingers are warm and well-perfused.  2+ radial pulse.  IMAGING: No new imaging obtained today   New Medications:  No orders of the defined types were placed in this encounter.     , MD  11/25/2020 10:45 AM

## 2021-02-16 ENCOUNTER — Other Ambulatory Visit: Payer: Self-pay

## 2021-02-16 ENCOUNTER — Encounter: Payer: Self-pay | Admitting: Adult Health

## 2021-02-16 ENCOUNTER — Other Ambulatory Visit (HOSPITAL_COMMUNITY)
Admission: RE | Admit: 2021-02-16 | Discharge: 2021-02-16 | Disposition: A | Discharge status: Home / Self Care | Payer: BC Managed Care – PPO | Source: Ambulatory Visit | Attending: Adult Health | Admitting: Adult Health

## 2021-02-16 ENCOUNTER — Ambulatory Visit (INDEPENDENT_AMBULATORY_CARE_PROVIDER_SITE_OTHER): Payer: BC Managed Care – PPO | Admitting: Adult Health

## 2021-02-16 VITALS — BP 162/108 | HR 87 | Ht 60.75 in | Wt 254.5 lb

## 2021-02-16 DIAGNOSIS — Z1211 Encounter for screening for malignant neoplasm of colon: Secondary | ICD-10-CM | POA: Diagnosis not present

## 2021-02-16 DIAGNOSIS — Z1231 Encounter for screening mammogram for malignant neoplasm of breast: Secondary | ICD-10-CM | POA: Diagnosis not present

## 2021-02-16 DIAGNOSIS — I10 Essential (primary) hypertension: Secondary | ICD-10-CM | POA: Diagnosis not present

## 2021-02-16 DIAGNOSIS — Z01419 Encounter for gynecological examination (general) (routine) without abnormal findings: Secondary | ICD-10-CM | POA: Diagnosis not present

## 2021-02-16 LAB — HEMOCCULT GUIAC POC 1CARD (OFFICE): Fecal Occult Blood, POC: NEGATIVE

## 2021-02-16 NOTE — Progress Notes (Signed)
Patient ID: Renee [name], female   DOB: 1981-01-21, 40 y.o.   MRN: [id number] History of Present Illness: Renee [name] is a 40 year old black female,single, G2P0020, in for a well woman gyn exam and pap. She sees Dr Horald Pollen tomorrow.  PCP is [name] PA   Current Medications, Allergies, Past Medical History, Past Surgical History, Family History and Social History were reviewed in [employer] record.     Review of Systems:  Patient denies any hearing loss, fatigue, blurred vision, shortness of breath, chest pain, abdominal pain, problems with urination, or intercourse. No joint pain or mood swings.  Has had headaches, and some dizziness, has not taken BP meds today.Constipated at times.  Physical Exam:BP (!) 162/108 (BP Location: Left Arm, Cuff Size: Large)   Pulse 87   Ht 5' 0.75" (1.543 m)   Wt 254 lb 8 oz (115.4 kg)   LMP 02/13/2021   BMI 48.48 kg/m   General:  Well developed, well nourished, no acute distress Skin:  Warm and dry Neck:  Midline trachea, normal thyroid, good ROM, no lymphadenopathy Lungs; Clear to auscultation bilaterally Breast:  No dominant palpable mass, retraction, or nipple discharge Cardiovascular: Regular rate and rhythm Abdomen:  Soft, non tender, no hepatosplenomegaly Pelvic:  External genitalia is normal in appearance, no lesions.  The vagina is normal in appearance. Urethra has no lesions or masses. The cervix is smooth, pap with GC/CHL and HR HPV genotyping performed.  Uterus is felt to be normal size, shape, and contour.  No adnexal masses or tenderness noted.Bladder is non tender, no masses felt. Rectal: Good sphincter tone, no polyps, or hemorrhoids felt.  Hemoccult negative. Extremities/musculoskeletal:  No swelling or varicosities noted, no clubbing or cyanosis Psych:  No mood changes, alert and cooperative,seems happy Co exam with Hubbard Robinson, NP student. AA is 5 Fall risk is moderate Depression screen [facility] 2/9 02/16/2021  05/31/2016  Decreased Interest 0 0  Down, Depressed, Hopeless 0 1  PHQ - 2 Score 0 1  Altered sleeping 2 -  Tired, decreased energy 3 -  Change in appetite 0 -  Feeling bad or failure about yourself  0 -  Trouble concentrating 0 -  Moving slowly or fidgety/restless 0 -  Suicidal thoughts 0 -  PHQ-9 Score 5 -    GAD 7 : Generalized Anxiety Score 02/16/2021  Nervous, Anxious, on Edge 1  Control/stop worrying 0  Worry too much - different things 0  Trouble relaxing 1  Restless 0  Easily annoyed or irritable 0  Afraid - awful might happen 0  Total GAD 7 Score 2    Upstream - 02/16/21 3893       Pregnancy Intention Screening   Does the patient want to become pregnant in the next year? Yes    Does the patient's partner want to become pregnant in the next year? Yes    Would the patient like to discuss contraceptive options today? No      Contraception Wrap Up   Current Method Pregnant/Seeking Pregnancy    End Method Pregnant/Seeking Pregnancy    Contraception Counseling Provided No               Impression and Plan: 1. Encounter for gynecological examination with Papanicolaou smear of cervix Pap sent Physical in 1 year Pap in 3 years if normal Will check labs - Cytology - PAP( [place]) - CBC - Comprehensive metabolic panel - TSH - Lipid panel - T4, free Try to  lose about 25 lbs and stop smoking and decrease alcohol use, before deciding to get pregnant 2. Encounter for screening fecal occult blood testing  - POCT occult blood stool  3. Screening mammogram for breast cancer Mammogram scheduled at [facility] 11/21[facility]22 at 8:30 am  - MM 3D SCREEN BREAST BILATERAL; Future  4. Hypertension, unspecified type Take BP meds and call [name] PA  for appt.

## 2021-02-17 DIAGNOSIS — E210 Primary hyperparathyroidism: Secondary | ICD-10-CM | POA: Diagnosis not present

## 2021-02-17 DIAGNOSIS — I10 Essential (primary) hypertension: Secondary | ICD-10-CM | POA: Diagnosis not present

## 2021-02-17 DIAGNOSIS — Z23 Encounter for immunization: Secondary | ICD-10-CM | POA: Diagnosis not present

## 2021-02-17 DIAGNOSIS — M810 Age-related osteoporosis without current pathological fracture: Secondary | ICD-10-CM | POA: Diagnosis not present

## 2021-02-17 DIAGNOSIS — E559 Vitamin D deficiency, unspecified: Secondary | ICD-10-CM | POA: Diagnosis not present

## 2021-02-17 LAB — COMPREHENSIVE METABOLIC PANEL
ALT: 14 IU/L (ref 0–32)
AST: 12 IU/L (ref 0–40)
Albumin/Globulin Ratio: 1.4 (ref 1.2–2.2)
Albumin: 4.2 g/dL (ref 3.8–4.8)
Alkaline Phosphatase: 99 IU/L (ref 44–121)
BUN/Creatinine Ratio: 14 (ref 9–23)
BUN: 16 mg/dL (ref 6–24)
Bilirubin Total: 0.2 mg/dL (ref 0.0–1.2)
CO2: 28 mmol/L (ref 20–29)
Calcium: 12.4 mg/dL — ABNORMAL HIGH (ref 8.7–10.2)
Chloride: 100 mmol/L (ref 96–106)
Creatinine, Ser: 1.15 mg/dL — ABNORMAL HIGH (ref 0.57–1.00)
Globulin, Total: 2.9 g/dL (ref 1.5–4.5)
Glucose: 108 mg/dL — ABNORMAL HIGH (ref 70–99)
Potassium: 4.3 mmol/L (ref 3.5–5.2)
Sodium: 137 mmol/L (ref 134–144)
Total Protein: 7.1 g/dL (ref 6.0–8.5)
eGFR: 62 mL/min/{1.73_m2} (ref >59)

## 2021-02-17 LAB — LIPID PANEL
Chol/HDL Ratio: 2.2 ratio (ref 0.0–4.4)
Cholesterol, Total: 129 mg/dL (ref 100–199)
HDL: 59 mg/dL (ref >39)
LDL Chol Calc (NIH): 53 mg/dL (ref 0–99)
Triglycerides: 92 mg/dL (ref 0–149)
VLDL Cholesterol Cal: 17 mg/dL (ref 5–40)

## 2021-02-17 LAB — CBC
Hematocrit: 41.6 % (ref 34.0–46.6)
Hemoglobin: 13.8 g/dL (ref 11.1–15.9)
MCH: 28.8 pg (ref 26.6–33.0)
MCHC: 33.2 g/dL (ref 31.5–35.7)
MCV: 87 fL (ref 79–97)
Platelets: 307 10*3/uL (ref 150–450)
RBC: 4.8 x10E6/uL (ref 3.77–5.28)
RDW: 12.8 % (ref 11.7–15.4)
WBC: 8 10*3/uL (ref 3.4–10.8)

## 2021-02-17 LAB — T4, FREE: Free T4: 1.53 ng/dL (ref 0.82–1.77)

## 2021-02-17 LAB — TSH: TSH: 3.06 u[IU]/mL (ref 0.450–4.500)

## 2021-02-18 LAB — CYTOLOGY - PAP
Adequacy: ABSENT
Chlamydia: NEGATIVE
Comment: NEGATIVE
Comment: NEGATIVE
Comment: NORMAL
Diagnosis: NEGATIVE
High risk HPV: NEGATIVE
Neisseria Gonorrhea: NEGATIVE

## 2021-02-19 ENCOUNTER — Encounter: Payer: Self-pay | Admitting: Adult Health

## 2021-02-19 DIAGNOSIS — R7989 Other specified abnormal findings of blood chemistry: Secondary | ICD-10-CM

## 2021-02-19 HISTORY — DX: Other specified abnormal findings of blood chemistry: R79.89

## 2021-02-25 NOTE — Addendum Note (Signed)
Addended by: Moss Mc on: 02/25/2021 06:03 PM   Modules accepted: Orders

## 2021-02-26 ENCOUNTER — Other Ambulatory Visit: Payer: BC Managed Care – PPO

## 2021-02-26 DIAGNOSIS — E214 Other specified disorders of parathyroid gland: Secondary | ICD-10-CM | POA: Diagnosis not present

## 2021-02-26 DIAGNOSIS — R7989 Other specified abnormal findings of blood chemistry: Secondary | ICD-10-CM | POA: Diagnosis not present

## 2021-02-27 LAB — PARATHYROID HORMONE, INTACT (NO CA): PTH: 86 pg/mL — ABNORMAL HIGH (ref 15–65)

## 2021-02-27 LAB — CREATININE, SERUM
Creatinine, Ser: 0.83 mg/dL (ref 0.57–1.00)
eGFR: 91 mL/min/{1.73_m2} (ref >59)

## 2021-02-27 LAB — PHOSPHORUS: Phosphorus: 2.6 mg/dL — ABNORMAL LOW (ref 3.0–4.3)

## 2021-02-27 LAB — CALCIUM: Calcium: 11 mg/dL — ABNORMAL HIGH (ref 8.7–10.2)

## 2021-03-01 ENCOUNTER — Ambulatory Visit (HOSPITAL_COMMUNITY)
Admission: RE | Admit: 2021-03-01 | Discharge: 2021-03-01 | Disposition: A | Discharge status: Home / Self Care | Payer: BC Managed Care – PPO | Source: Ambulatory Visit | Attending: Adult Health | Admitting: Adult Health

## 2021-03-01 ENCOUNTER — Telehealth: Payer: Self-pay | Admitting: Adult Health

## 2021-03-01 ENCOUNTER — Other Ambulatory Visit (HOSPITAL_COMMUNITY): Payer: Self-pay | Admitting: Adult Health

## 2021-03-01 ENCOUNTER — Other Ambulatory Visit: Payer: Self-pay

## 2021-03-01 DIAGNOSIS — R928 Other abnormal and inconclusive findings on diagnostic imaging of breast: Secondary | ICD-10-CM

## 2021-03-01 DIAGNOSIS — Z1231 Encounter for screening mammogram for malignant neoplasm of breast: Secondary | ICD-10-CM | POA: Diagnosis not present

## 2021-03-01 NOTE — Telephone Encounter (Signed)
Left message to call me about labs and mammogram

## 2021-03-02 ENCOUNTER — Telehealth: Payer: Self-pay | Admitting: Adult Health

## 2021-03-02 NOTE — Telephone Encounter (Signed)
Pt aware of abnormal mammogram, will call radiology at [facility] for follow appt. And is seeing Dr Horald Pollen already let her know PTH elevated still, as is calcium.

## 2021-03-09 ENCOUNTER — Ambulatory Visit (HOSPITAL_COMMUNITY)
Admission: RE | Admit: 2021-03-09 | Discharge: 2021-03-09 | Disposition: A | Discharge status: Home / Self Care | Payer: BC Managed Care – PPO | Source: Ambulatory Visit | Attending: Adult Health | Admitting: Adult Health

## 2021-03-09 ENCOUNTER — Encounter (HOSPITAL_COMMUNITY): Payer: BC Managed Care – PPO

## 2021-03-09 ENCOUNTER — Other Ambulatory Visit: Payer: Self-pay

## 2021-03-09 ENCOUNTER — Ambulatory Visit (HOSPITAL_COMMUNITY): Payer: BC Managed Care – PPO

## 2021-03-09 DIAGNOSIS — R928 Other abnormal and inconclusive findings on diagnostic imaging of breast: Secondary | ICD-10-CM | POA: Diagnosis not present

## 2021-03-09 DIAGNOSIS — N6489 Other specified disorders of breast: Secondary | ICD-10-CM | POA: Diagnosis not present

## 2021-03-09 DIAGNOSIS — R922 Inconclusive mammogram: Secondary | ICD-10-CM | POA: Diagnosis not present

## 2021-03-11 ENCOUNTER — Encounter: Payer: Self-pay | Admitting: Orthopaedic Surgery

## 2021-03-11 ENCOUNTER — Other Ambulatory Visit: Payer: Self-pay

## 2021-03-11 ENCOUNTER — Ambulatory Visit (INDEPENDENT_AMBULATORY_CARE_PROVIDER_SITE_OTHER): Payer: BC Managed Care – PPO | Admitting: Orthopaedic Surgery

## 2021-03-11 VITALS — BP 168/124 | HR 90 | Ht 61.0 in | Wt 255.0 lb

## 2021-03-11 DIAGNOSIS — M25562 Pain in left knee: Secondary | ICD-10-CM | POA: Diagnosis not present

## 2021-03-11 DIAGNOSIS — G8929 Other chronic pain: Secondary | ICD-10-CM

## 2021-03-11 MED ORDER — HYDROCODONE-ACETAMINOPHEN 5-325 MG PO TABS: ORAL_TABLET | ORAL | 0 refills | Status: DC

## 2021-03-11 MED ORDER — NAPROXEN 375 MG PO TABS: 375.0000 mg | ORAL_TABLET | Freq: Two times a day (BID) | ORAL | 5 refills | Status: DC

## 2021-03-11 NOTE — Patient Instructions (Addendum)
Note for work to wear regular shoes due to knee pain.

## 2021-03-11 NOTE — Progress Notes (Signed)
My knee hurts.  Her left knee is painful.  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  She had MRI several years ago showing ACL still intact.  She has no giving way.  She has swelling and popping but no giving way. She is not taking any NSAIDs.  She has to wear steel toed shoes that make her knee pain worse.  She has well healed scar left knee.  ROM is 0 to 115, some medial pain, stable, no distal edema.  Slight crepitus.  Encounter Diagnosis  Name Primary?   Chronic pain of left knee Yes   I will give knee brace.  I will begin Naprosyn 375.  I will give pain medicine.  I have reviewed the [place] Controlled Substance Reporting System web site prior to prescribing narcotic medicine for this patient.  Return in six weeks.  Consider MRI.  Note to wear regular shoes given also.  Call if any problem.  Precautions discussed.  Electronically Signed Darreld Mclean, MD 12/1/20229:39 AM

## 2021-04-22 ENCOUNTER — Encounter: Payer: Self-pay | Admitting: Orthopaedic Surgery

## 2021-04-22 ENCOUNTER — Other Ambulatory Visit: Payer: Self-pay

## 2021-04-22 ENCOUNTER — Ambulatory Visit (INDEPENDENT_AMBULATORY_CARE_PROVIDER_SITE_OTHER): Payer: BC Managed Care – PPO | Admitting: Orthopaedic Surgery

## 2021-04-22 VITALS — BP 158/101 | HR 83 | Ht 61.0 in | Wt 264.8 lb

## 2021-04-22 DIAGNOSIS — M25562 Pain in left knee: Secondary | ICD-10-CM

## 2021-04-22 DIAGNOSIS — G8929 Other chronic pain: Secondary | ICD-10-CM

## 2021-04-22 NOTE — Progress Notes (Signed)
I am so much better.  The Naprosyn really helped her knee pain on the left.  She is doing well now.  She has FLA papers to fill out.    She has no problem.  She has near full ROM of the left knee with slight crepitus.  NV intact.  Encounter Diagnosis  Name Primary?   Chronic pain of left knee Yes   I will see as needed.  Call if any problem.  Precautions discussed.  Electronically Signed Darreld Mclean, MD 1/12/202310:11 AM

## 2021-05-11 ENCOUNTER — Other Ambulatory Visit: Payer: Self-pay | Admitting: Surgery

## 2021-05-11 DIAGNOSIS — E0590 Thyrotoxicosis, unspecified without thyrotoxic crisis or storm: Secondary | ICD-10-CM

## 2021-06-04 ENCOUNTER — Ambulatory Visit
Admission: RE | Admit: 2021-06-04 | Discharge: 2021-06-04 | Disposition: A | Discharge status: Home / Self Care | Payer: BC Managed Care – PPO | Source: Ambulatory Visit | Attending: Surgery | Admitting: Surgery

## 2021-06-04 ENCOUNTER — Other Ambulatory Visit: Payer: BC Managed Care – PPO

## 2021-06-04 DIAGNOSIS — E041 Nontoxic single thyroid nodule: Secondary | ICD-10-CM | POA: Diagnosis not present

## 2021-06-04 DIAGNOSIS — E0590 Thyrotoxicosis, unspecified without thyrotoxic crisis or storm: Secondary | ICD-10-CM

## 2021-06-04 DIAGNOSIS — E213 Hyperparathyroidism, unspecified: Secondary | ICD-10-CM | POA: Diagnosis not present

## 2021-06-04 MED ORDER — IOPAMIDOL (ISOVUE-370) INJECTION 76%
75.0000 mL | Freq: Once | INTRAVENOUS | Status: AC | PRN
  Administered 2021-06-04: 75 mL via INTRAVENOUS

## 2021-06-09 ENCOUNTER — Ambulatory Visit: Payer: Self-pay | Admitting: Surgery

## 2021-06-09 NOTE — Progress Notes (Signed)
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  This correlates with the findings on ultrasound.  Will plan to proceed with minimally invasive parathyroidectomy as an outpatient procedure at [facility] as we had discussed in the office.  Will send orders to schedulers and they will call you.  Darnell Level, MD [facility] practice Office: [phone]

## 2021-07-24 IMAGING — US US ABDOMEN LIMITED
1 series · 14 of 25 positions shown · non-contrast
Comparison: January 27, 2020

CLINICAL DATA: Right upper quadrant pain

EXAM:
ULTRASOUND ABDOMEN LIMITED RIGHT UPPER QUADRANT

[Series 1: us abdomen limited ruq (liver/gb) · 14 of 56 slices shown]
[im 1/56]
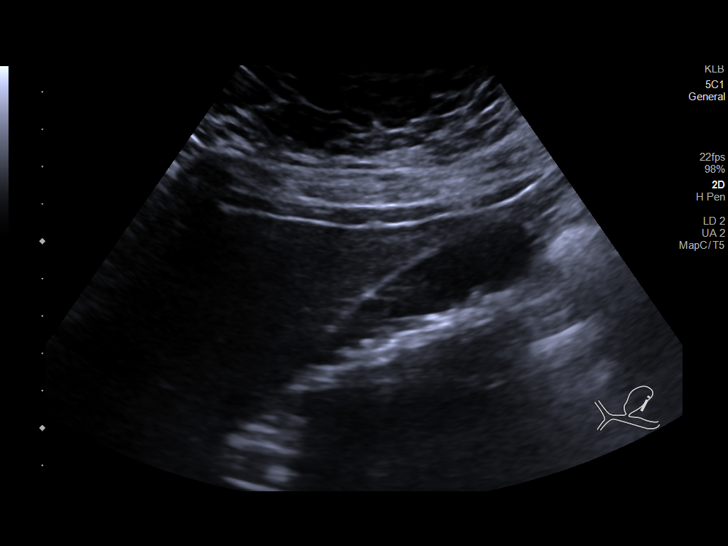
[im 5/56]
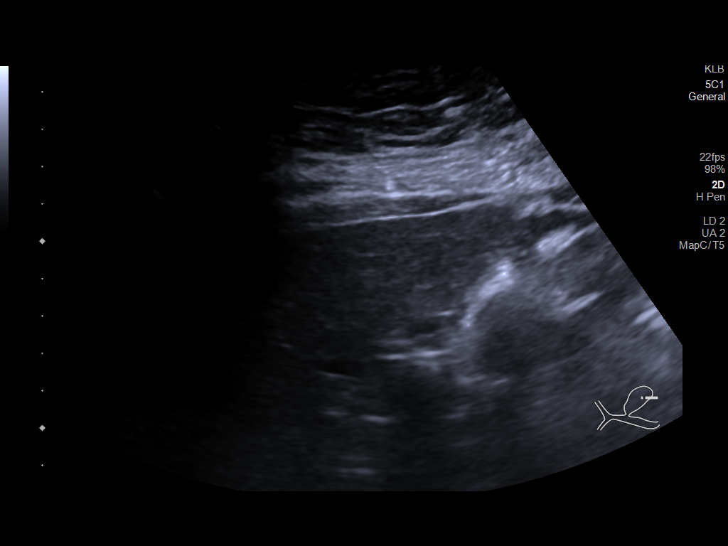
[im 10/56]
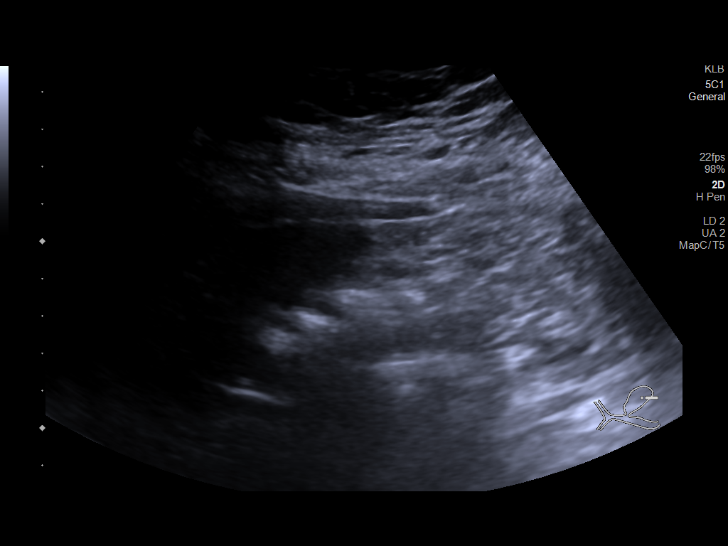
[im 14/56]
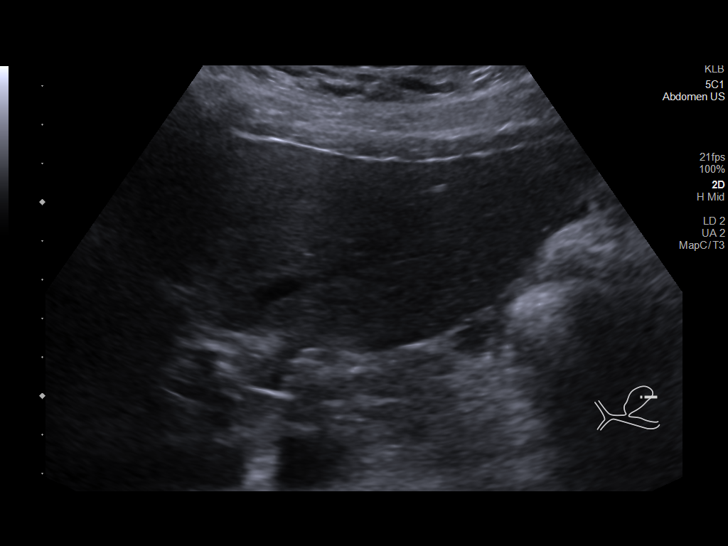
[im 19/56]
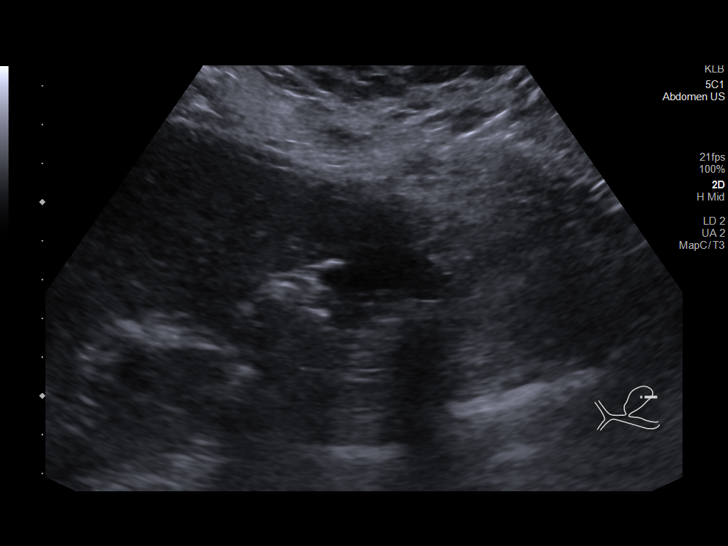
[im 21/56]
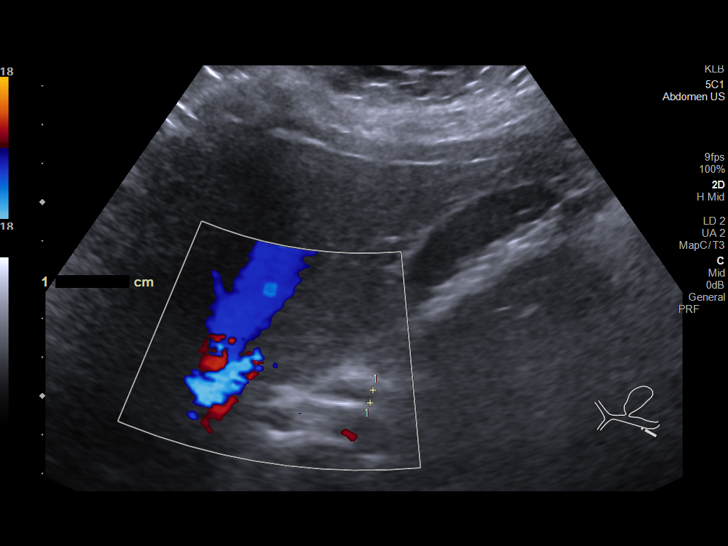
[im 26/56]
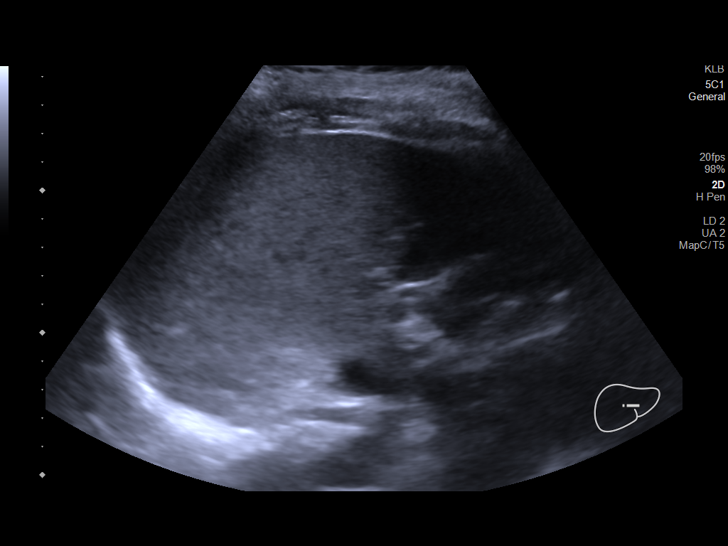
[im 30/56]
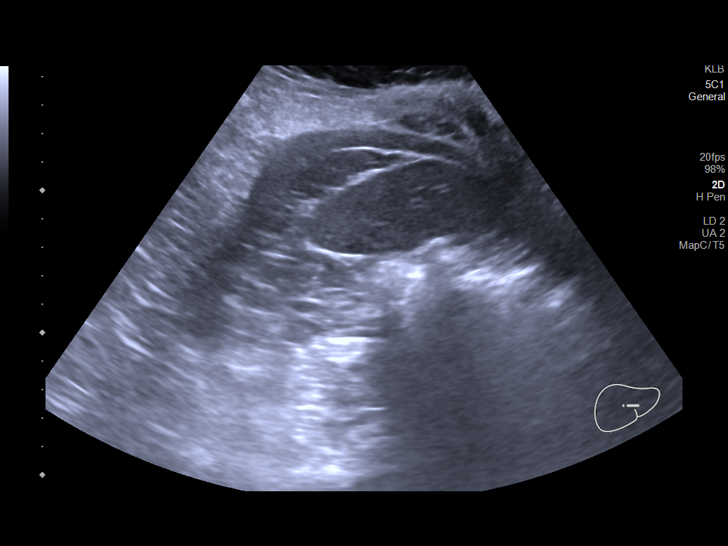
[im 35/56]
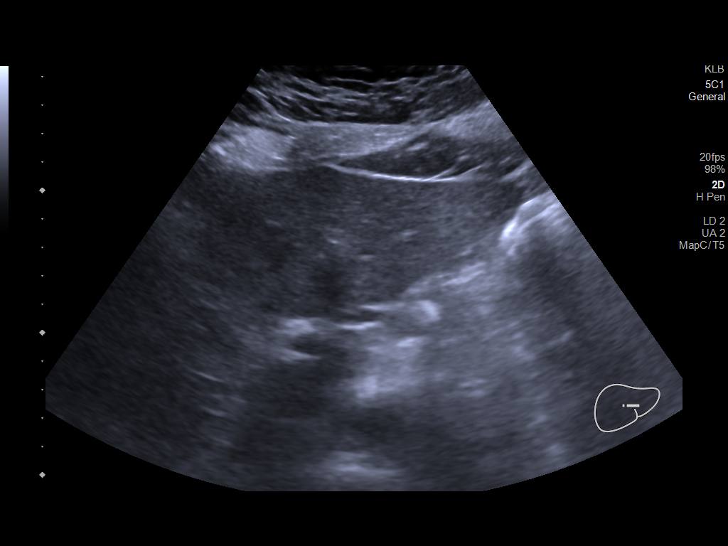
[im 37/56]
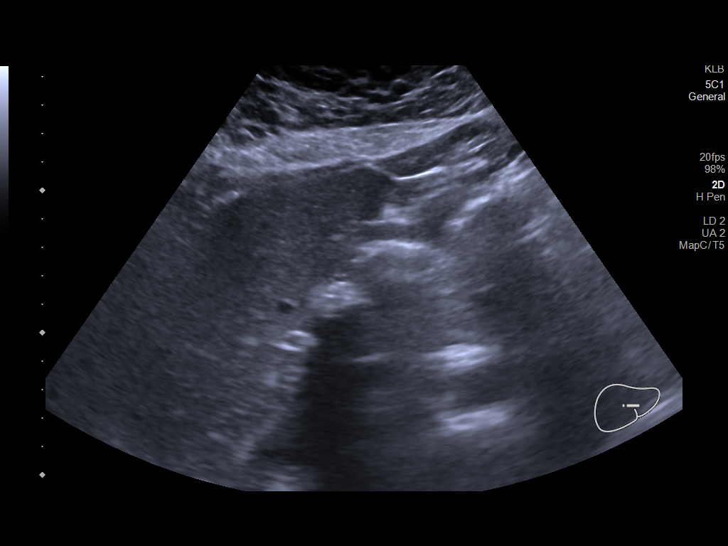
[im 42/56]
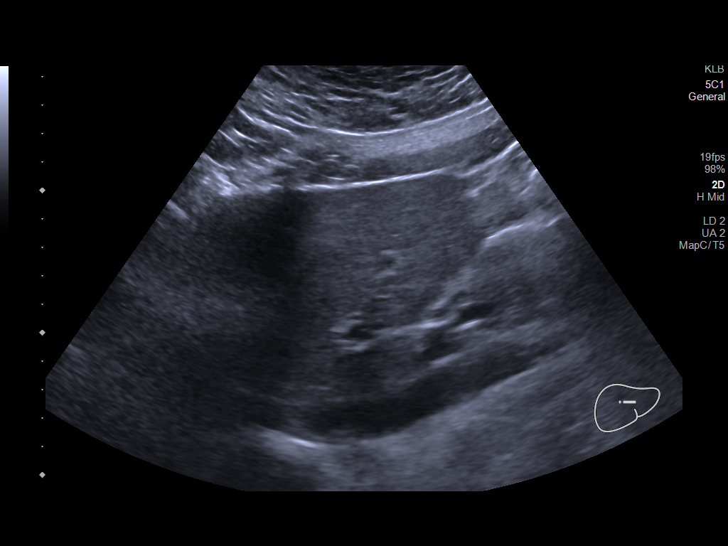
[im 46/56]
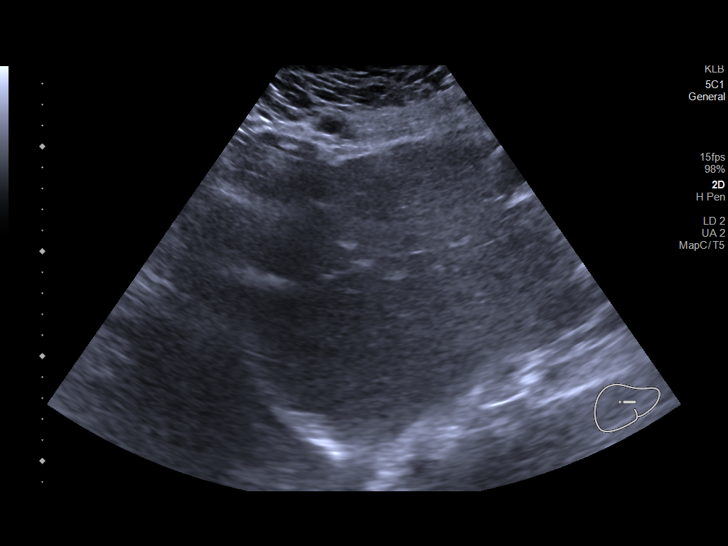
[im 51/56]
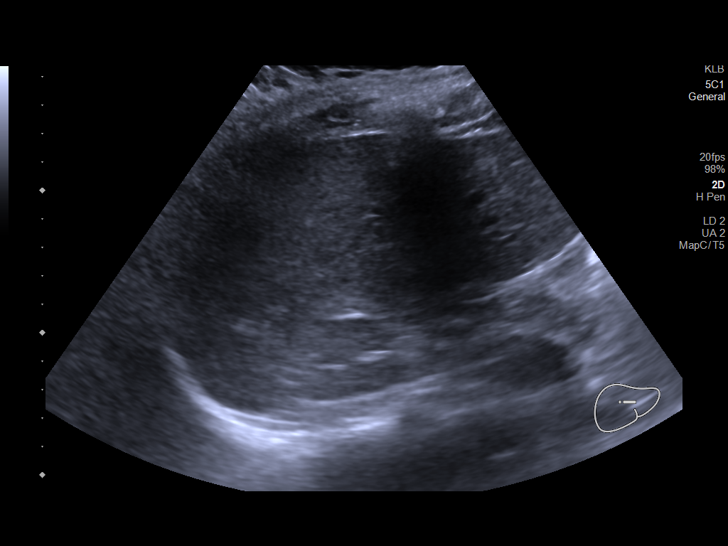
[im 56/56]
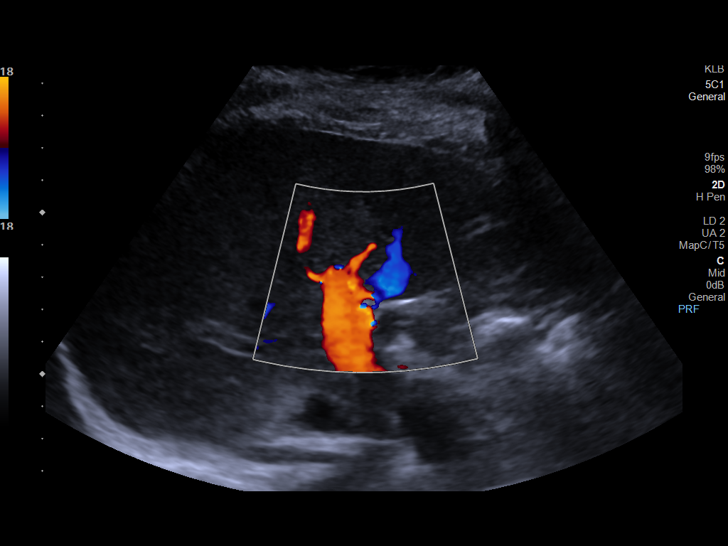

[14 of 25 positions shown; findings below may reference images not displayed]

FINDINGS: Gallbladder:

Stones and sludge seen dependently in the gallbladder without wall
thickening, pericholecystic fluid, or Murphy's sign.

Common bile duct:

Diameter: 3.4 mm

Liver:

Increased echogenicity in the liver with no focal mass. Portal vein
is patent on color Doppler imaging with normal direction of blood
flow towards the liver.

Other: None.
IMPRESSION: 1. Cholelithiasis and sludge without wall thickening,
pericholecystic fluid, or Murphy's sign.
2. Probable hepatic steatosis.

## 2021-08-03 ENCOUNTER — Encounter: Payer: Self-pay | Admitting: Orthopedic Surgery

## 2021-08-03 ENCOUNTER — Ambulatory Visit (INDEPENDENT_AMBULATORY_CARE_PROVIDER_SITE_OTHER): Payer: BC Managed Care – PPO

## 2021-08-03 ENCOUNTER — Ambulatory Visit: Payer: BC Managed Care – PPO | Admitting: Orthopedic Surgery

## 2021-08-03 VITALS — Ht 61.0 in | Wt 259.0 lb

## 2021-08-03 DIAGNOSIS — G5601 Carpal tunnel syndrome, right upper limb: Secondary | ICD-10-CM

## 2021-08-03 DIAGNOSIS — M79641 Pain in right hand: Secondary | ICD-10-CM | POA: Diagnosis not present

## 2021-08-03 NOTE — Progress Notes (Signed)
Return patient Visit ? ?Assessment: ?Renee [name] is a 41 y.o. female with the following: ?1. Carpal tunnel syndrome of right wrist ? ?Plan: ?Right hand pain, numbness and tingling has progressively worsened.  It has been bothering her for the past few weeks.  She has tried a brace.  It is limiting her function at work.  We will plan to proceed with EMG, and reconvene to discuss the results.  Briefly describe carpal tunnel release.  All questions were answered. ? ? ?Follow-up: ?Return for After EMG. ? ?Subjective: ? ?Chief Complaint  ?Patient presents with  ? Hand Pain  ?  Rt hand pain, NKI for 2-3 wks   ? ? ?History of Present Illness: ?Renee [name] is a 41 y.o. female who presents for evaluation of right hand pain, numbness and tingling.  She continues to have symptoms consistent with carpal tunnel syndrome.  She describes pain in her right hand over the past few weeks.  No specific injury.  Nothing changed recently.  She struggles with her work sometimes, due to her symptoms. ? ?Review of Systems: ?No fevers or chills ?+ numbness and tingling ?No chest pain ?No shortness of breath ?No bowel or bladder dysfunction ?No GI distress ?No headaches ? ? ? ?Objective: ?Ht 5\' 1"  (1.549 m)   Wt 259 lb (117.5 kg)   BMI 48.94 kg/m?  ? ?Physical Exam: ? ?General: Alert and oriented. and No acute distress. ?Gait: Normal gait. ? ?Evaluation of right hand demonstrates no deformity.  No atrophy within the thenar eminence.  Sensation intact throughout the right hand.  Mildly positive Tinel's at the carpal tunnel.  Positive carpal tunnel compression test.  Positive Phalen's.  Grip strength is 4+/5, and she is attempted.  Fingers are warm and well-perfused.  2+ radial pulse. ? ?IMAGING: ?I personally ordered and reviewed the following images ? ?X-rays of the right hand were obtained in clinic today.  No acute injuries are noted.  No dislocations.  Overall alignment remains good. ? ?Impression: Normal right hand x-ray. ? ?New  Medications:  ?No orders of the defined types were placed in this encounter. ? ? ? ? ? , MD ? ?08/03/2021 ?11:04 PM ? ? ? ?

## 2021-08-11 DIAGNOSIS — I10 Essential (primary) hypertension: Secondary | ICD-10-CM | POA: Diagnosis not present

## 2021-08-11 DIAGNOSIS — Z6841 Body Mass Index (BMI) 40.0 and over, adult: Secondary | ICD-10-CM | POA: Diagnosis not present

## 2021-09-07 ENCOUNTER — Encounter: Payer: Self-pay | Admitting: Emergency Medicine

## 2021-09-07 ENCOUNTER — Ambulatory Visit
Admission: EM | Admit: 2021-09-07 | Discharge: 2021-09-07 | Disposition: A | Discharge status: Home / Self Care | Payer: BC Managed Care – PPO | Attending: Family Medicine | Admitting: Family Medicine

## 2021-09-07 ENCOUNTER — Other Ambulatory Visit: Payer: Self-pay

## 2021-09-07 DIAGNOSIS — H00014 Hordeolum externum left upper eyelid: Secondary | ICD-10-CM | POA: Diagnosis not present

## 2021-09-07 IMAGING — RF DG CHOLANGIOGRAM OPERATIVE
1 series · 4 of 4 positions shown · non-contrast
Comparison: 02/28/2020

CLINICAL DATA: Cholelithiasis.

EXAM:
INTRAOPERATIVE CHOLANGIOGRAM
TECHNIQUE: Cholangiographic images from the C-arm fluoroscopic device were
submitted for interpretation post-operatively. Please see the
procedural report for the amount of contrast and the fluoroscopy
time utilized.

[Series 1: run · 4 of 55 frames shown]
[frame 9/55]
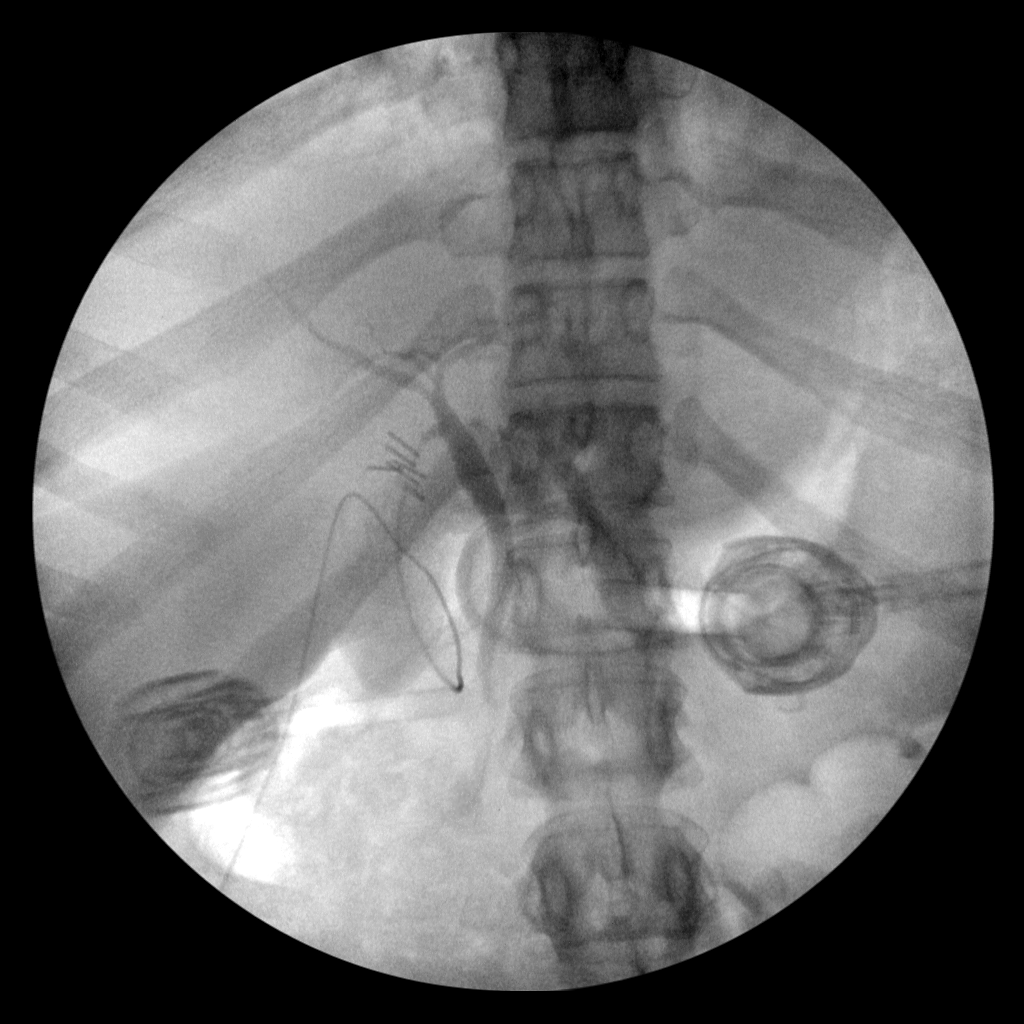
[frame 28/55]
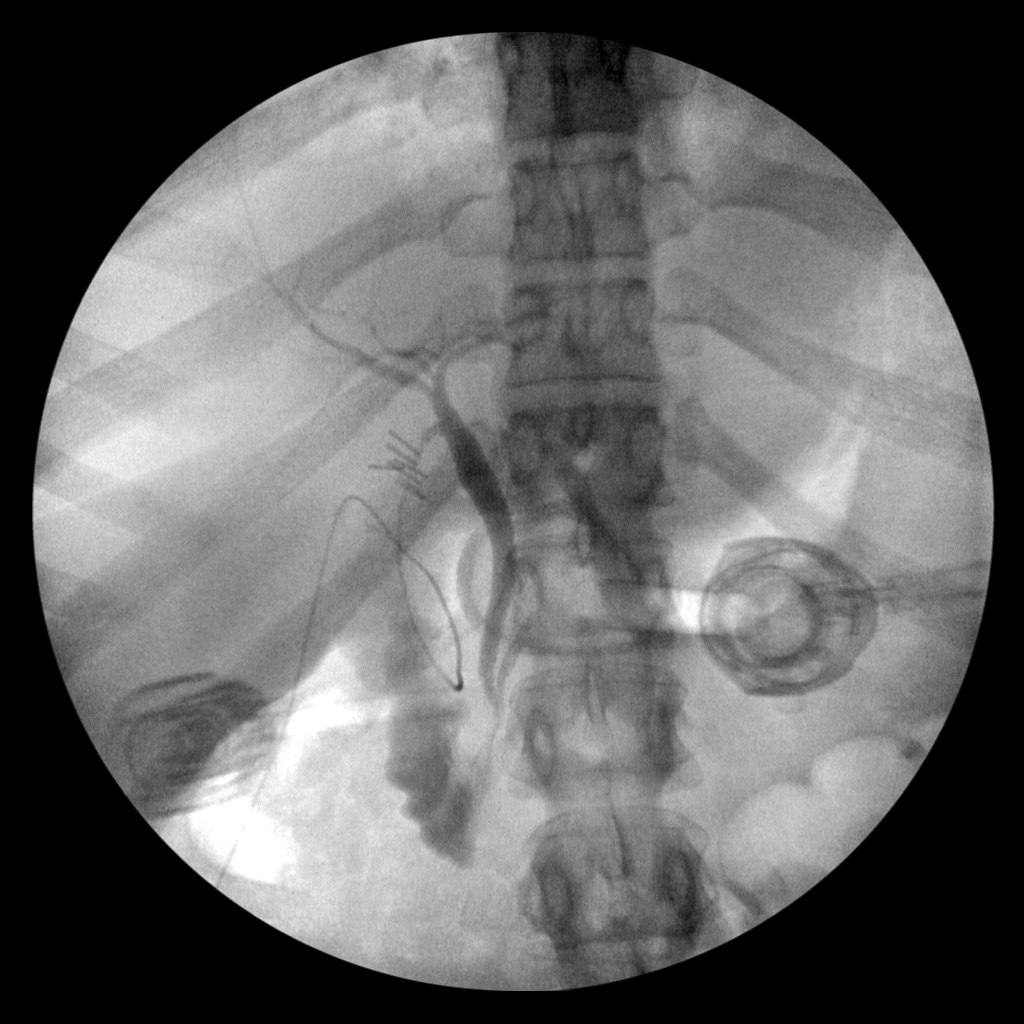
[frame 47/55]
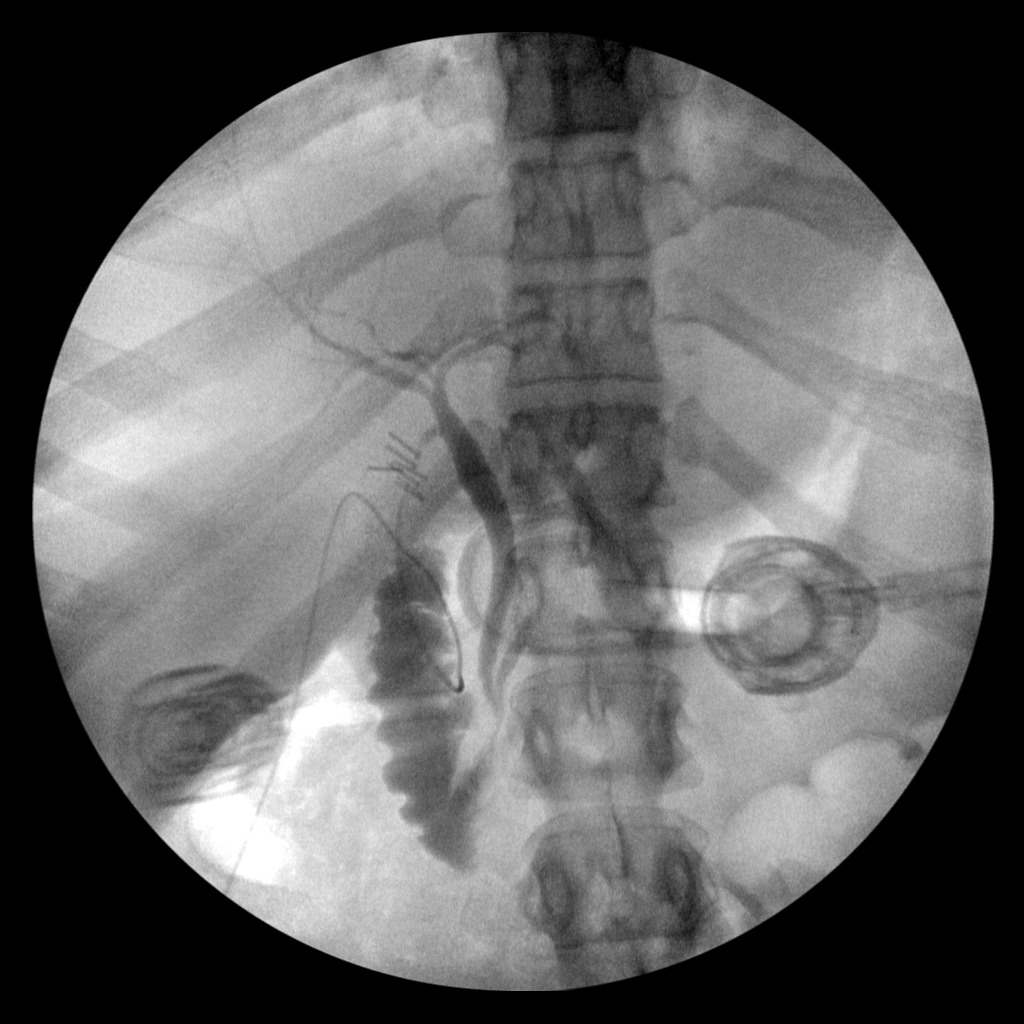
[frame 50/55]
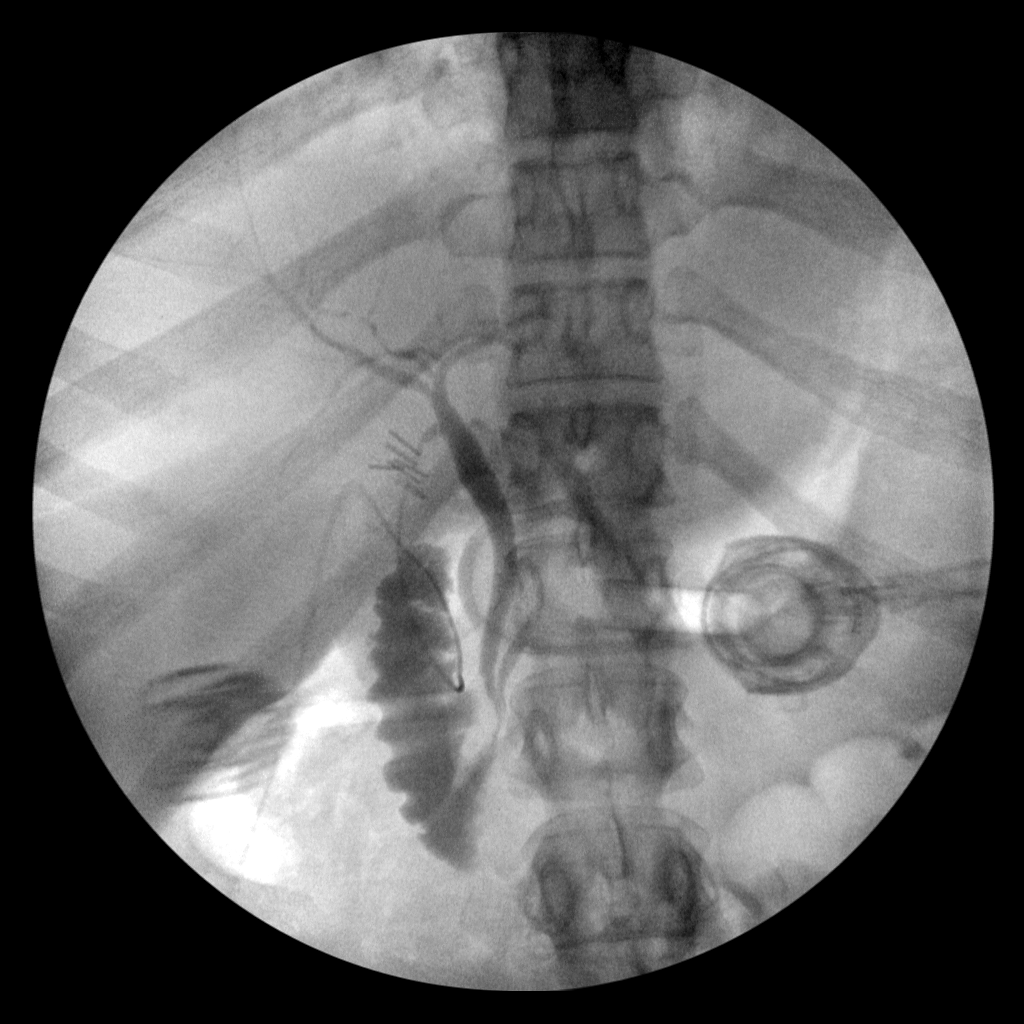

[4 of 4 positions shown; findings below may reference images not displayed]

FINDINGS: Contrast fills the intrahepatic and extrahepatic biliary system.
Contrast refluxes into the pancreatic duct. Contrast drains into the
duodenum. No large filling defects or stones.
IMPRESSION: Patent biliary system.  Normal intraoperative cholangiogram.

## 2021-09-07 MED ORDER — POLYMYXIN B-TRIMETHOPRIM 10000-0.1 UNIT/ML-% OP SOLN: 1.0000 [drp] | OPHTHALMIC | 0 refills | Status: DC

## 2021-09-07 NOTE — ED Triage Notes (Addendum)
Pt reports left eyelid swelling and drainage since yesterday. Pt reports has tried otc sty medication with no relief of symptoms. Pt reports works in [employer] and was sent home from work today.

## 2021-09-07 NOTE — ED Provider Notes (Signed)
[facility]    CSN: [id number] Arrival date & time: 09/07/21  1617      History   Chief Complaint Chief Complaint  Patient presents with   Eye Problem    HPI Renee [name] is a 41 y.o. female.   Presenting today with 1 day history of left upper eyelid redness, swelling that has progressed over the last 12 hours or so.  She denies injury to the area, itching, drainage but does have soreness in the area.  Has tried over-the-counter stye eyedrops with no relief.  Was sent home from work to make sure it was not contagious.   Past Medical History:  Diagnosis Date   Elevated serum creatinine 02/19/2021   Herpes simplex virus (HSV) infection    no OB +HSV 05/04/16 at [facility]    Hypertension    Hypothyroidism    Vaginal Pap smear, abnormal     Patient Active Problem List   Diagnosis Date Noted   Serum calcium elevated 02/19/2021   Elevated serum creatinine 02/19/2021   Screening mammogram for breast cancer 02/16/2021   Encounter for screening fecal occult blood testing 02/16/2021   Encounter for gynecological examination with Papanicolaou smear of cervix 02/16/2021   Cholelithiasis with chronic cholecystitis 04/13/2020   Cholelithiasis with cholecystitis 04/02/2020   Hyperparathyroidism, primary ([place]) 04/02/2020   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 Authorizing Provider  trimethoprim-polymyxin b  (POLYTRIM) ophthalmic solution Place 1 drop into the left eye every 4 (four) hours. 09/07/21  Yes Volney American, PA-C  amLODipine (NORVASC) 5 MG tablet Take 5 mg by mouth daily. 01/10/20   [provider]  Cholecalciferol (DIALYVITE VITAMIN D 5000) 125 MCG (5000 UT) capsule Take 5,000 Units by mouth daily.    [provider]  HYDROcodone-acetaminophen (NORCO/VICODIN) 5-325 MG tablet One tablet every four hours for pain. 03/11/21   Sanjuana Kava, MD  losartan (COZAAR) 100 MG tablet Take 100 mg by mouth daily.    [provider]  naproxen (NAPROSYN) 375 MG tablet Take 1 tablet (375 mg total) by mouth 2 (two) times daily with a meal. 03/11/21   Sanjuana Kava, MD    Family History Family History  Problem Relation Age of Onset   Congestive Heart Failure Maternal Grandmother    Hypertension Maternal Grandmother    Diabetes Maternal Grandmother    Other Maternal Grandfather        aneursym   Kidney disease Maternal Grandfather        on dialysis   Congestive Heart Failure Maternal Grandfather    Diabetes Maternal Grandfather     Social History  Social History   Tobacco Use   Smoking status: Every Day    Packs/day: 0.25    Years: 17.00    Pack years: 4.25    Types: Cigarettes   Smokeless tobacco: Never   Tobacco comments:    smokes 2-3 cig daily  Vaping Use   Vaping Use: Never used  Substance Use Topics   Alcohol use: Yes    Comment: socially   Drug use: No     Allergies   Patient has no known allergies.   Review of Systems Review of Systems Per HPI  Physical Exam Triage Vital Signs ED Triage Vitals [09/07/21 1627]  Enc Vitals Group     BP (!) 152/94     Pulse Rate 94     Resp 18     Temp 98.3 F (36.8 C)     Temp Source Oral     SpO2 96 %     Weight 260 lb (117.9 kg)     Height 5\' 2"  (1.575 m)     Head Circumference      Peak Flow      Pain Score 4     Pain Loc      Pain Edu?      Excl. in [place]?    No data  found.  Updated Vital Signs BP (!) 152/94 (BP Location: Right Arm)   Pulse 94   Temp 98.3 F (36.8 C) (Oral)   Resp 18   Ht 5\' 2"  (1.575 m)   Wt 260 lb (117.9 kg)   LMP 08/22/2021 (Approximate)   SpO2 96%   BMI 47.55 kg/m   Visual Acuity Right Eye Distance:   Left Eye Distance:   Bilateral Distance:    Right Eye Near:   Left Eye Near:    Bilateral Near:     Physical Exam Vitals and nursing note reviewed.  Constitutional:      Appearance: Normal appearance. She is not ill-appearing.  HENT:     Head: Atraumatic.     Mouth/Throat:     Mouth: Mucous membranes are moist.     Pharynx: Oropharynx is clear.  Eyes:     Extraocular Movements: Extraocular movements intact.     Conjunctiva/sclera: Conjunctivae normal.     Pupils: Pupils are equal, round, and reactive to light.     Comments: Left upper eyelid erythematous, edematous starting at lash line.  No active drainage  Cardiovascular:     Rate and Rhythm: Normal rate and regular rhythm.     Heart sounds: Normal heart sounds.  Pulmonary:     Effort: Pulmonary effort is normal.     Breath sounds: Normal breath sounds.  Musculoskeletal:        General: Normal range of motion.     Cervical back: Normal range of motion and neck supple.  Skin:    General: Skin is warm and dry.  Neurological:     Mental Status: She is alert and oriented to person, place, and time.  Psychiatric:        Mood and Affect: Mood normal.        Thought Content: Thought content normal.        Judgment: Judgment normal.     UC Treatments / Results  Labs (all labs ordered are listed, but only abnormal results are displayed) Labs Reviewed - No data to display  EKG   Radiology No results found.  Procedures Procedures (including critical care time)  Medications Ordered in UC Medications -  No data to display  Initial Impression / Assessment and Plan / UC Course  I have reviewed the triage vital signs and the nursing  notes.  Pertinent labs & imaging results that were available during my care of the patient were reviewed by me and considered in my medical decision making (see chart for details).     Treat with Polytrim drops, warm compresses, good hand hygiene.  Work note given.  Final Clinical Impressions(s) / UC Diagnoses   Final diagnoses:  Hordeolum externum of left upper eyelid   Discharge Instructions   None    ED Prescriptions     Medication Sig Dispense Auth. Provider   trimethoprim-polymyxin b (POLYTRIM) ophthalmic solution Place 1 drop into the left eye every 4 (four) hours. 10 mL Volney American, [place]      PDMP not reviewed this encounter.   Volney American, [place] 09/07/21 [phone]

## 2021-10-05 DIAGNOSIS — I10 Essential (primary) hypertension: Secondary | ICD-10-CM | POA: Diagnosis not present

## 2021-10-05 DIAGNOSIS — H00039 Abscess of eyelid unspecified eye, unspecified eyelid: Secondary | ICD-10-CM | POA: Diagnosis not present

## 2021-10-05 DIAGNOSIS — Z6841 Body Mass Index (BMI) 40.0 and over, adult: Secondary | ICD-10-CM | POA: Diagnosis not present

## 2021-10-05 DIAGNOSIS — H00019 Hordeolum externum unspecified eye, unspecified eyelid: Secondary | ICD-10-CM | POA: Diagnosis not present

## 2021-10-28 ENCOUNTER — Ambulatory Visit (INDEPENDENT_AMBULATORY_CARE_PROVIDER_SITE_OTHER): Payer: BC Managed Care – PPO

## 2021-10-28 ENCOUNTER — Encounter: Payer: Self-pay | Admitting: Orthopaedic Surgery

## 2021-10-28 ENCOUNTER — Ambulatory Visit (INDEPENDENT_AMBULATORY_CARE_PROVIDER_SITE_OTHER): Payer: BC Managed Care – PPO | Admitting: Orthopaedic Surgery

## 2021-10-28 DIAGNOSIS — M25561 Pain in right knee: Secondary | ICD-10-CM | POA: Diagnosis not present

## 2021-10-28 DIAGNOSIS — G8929 Other chronic pain: Secondary | ICD-10-CM

## 2021-10-28 MED ORDER — METHYLPREDNISOLONE ACETATE 40 MG/ML IJ SUSP
40.0000 mg | Freq: Once | INTRAMUSCULAR | Status: AC
  Administered 2021-10-28: 40 mg via INTRA_ARTICULAR

## 2021-10-28 NOTE — Addendum Note (Signed)
Addended byCaffie Damme on: 10/28/2021 08:47 AM   Modules accepted: Orders

## 2021-10-28 NOTE — Progress Notes (Signed)
My right knee is hurting now.  She has started having pain in the right knee.  Prior I had seen her for left knee pain.  About a month to six weeks ago, she started having pain in the right knee.  She has had swelling, popping and some giving way.  She denies any trauma.  She has no redness, no numbness.  It is not getting any better.  She has tried rest, heat, ice, rubs and Advil with minimal help. She is taking Naprosyn 375.  Right knee is tender, has slight effusion, more pain laterally, crepitus, limp right, ROM 0 to 105, weakly positive medial McMurray, no distal edema.  X-rays were done of the right knee, reported separately.  Encounter Diagnosis  Name Primary?   Chronic pain of right knee Yes   PROCEDURE NOTE:  The patient requests injections of the right knee , verbal consent was obtained.  The right knee was prepped appropriately after time out was performed.   Sterile technique was observed and injection of 1 cc of DepoMedrol 40mg  with several cc's of plain xylocaine. Anesthesia was provided by ethyl chloride and a 20-gauge needle was used to inject the knee area. The injection was tolerated well.  A band aid dressing was applied.  The patient was advised to apply ice later today and tomorrow to the injection sight as needed.  Return in one month.  Call if any problem.  Precautions discussed.  Electronically Signed , MD 7/20/20238:19 AM

## 2021-11-25 ENCOUNTER — Ambulatory Visit: Payer: BC Managed Care – PPO | Admitting: Orthopaedic Surgery

## 2021-12-03 ENCOUNTER — Ambulatory Visit
Admission: EM | Admit: 2021-12-03 | Discharge: 2021-12-03 | Disposition: A | Discharge status: Home / Self Care | Payer: BC Managed Care – PPO | Attending: Family Medicine | Admitting: Family Medicine

## 2021-12-03 ENCOUNTER — Encounter: Payer: Self-pay | Admitting: Emergency Medicine

## 2021-12-03 DIAGNOSIS — J069 Acute upper respiratory infection, unspecified: Secondary | ICD-10-CM

## 2021-12-03 DIAGNOSIS — U071 COVID-19: Secondary | ICD-10-CM | POA: Diagnosis not present

## 2021-12-03 DIAGNOSIS — R197 Diarrhea, unspecified: Secondary | ICD-10-CM | POA: Diagnosis not present

## 2021-12-03 LAB — RESP PANEL BY RT-PCR (FLU A&B, COVID) ARPGX2
Influenza A by PCR: NEGATIVE
Influenza B by PCR: NEGATIVE
SARS Coronavirus 2 by RT PCR: POSITIVE — AB

## 2021-12-03 NOTE — ED Provider Notes (Signed)
[facility]    CSN: [id number] Arrival date & time: 12/03/21  1817      History   Chief Complaint No chief complaint on file.   HPI Renee [name] is a 41 y.o. female.   Patient presenting today with new onset sore throat, cough, congestion, diarrhea, chills, fatigue that started this morning.  Denies fever, body aches, chest pain, shortness of breath, vomiting.  So far not trying anything over-the-counter for symptoms.  Multiple sick contacts at work but unsure with what.  No known pertinent chronic medical problems.    Past Medical History:  Diagnosis Date   Elevated serum creatinine 02/19/2021   Herpes simplex virus (HSV) infection    no OB +HSV 05/04/16 at [facility]    Hypertension    Hypothyroidism    Vaginal Pap smear, abnormal     Patient Active Problem List   Diagnosis Date Noted   Serum calcium elevated 02/19/2021   Elevated serum creatinine 02/19/2021   Screening mammogram for breast cancer 02/16/2021   Encounter for screening fecal occult blood testing 02/16/2021   Encounter for gynecological examination with Papanicolaou smear of cervix 02/16/2021   Cholelithiasis with chronic cholecystitis 04/13/2020   Cholelithiasis with cholecystitis 04/02/2020   Hyperparathyroidism, primary ([place]) 04/02/2020   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 Authorizing Provider  amLODipine (NORVASC) 5 MG tablet  Take 5 mg by mouth daily. 01/10/20   [provider]  Cholecalciferol (DIALYVITE VITAMIN D 5000) 125 MCG (5000 UT) capsule Take 5,000 Units by mouth daily.    [provider]  HYDROcodone-acetaminophen (NORCO/VICODIN) 5-325 MG tablet One tablet every four hours for pain. Patient not taking: Reported on 10/28/2021 03/11/21   Sanjuana Kava, MD  losartan (COZAAR) 100 MG tablet Take 100 mg by mouth daily.    [provider]  naproxen (NAPROSYN) 375 MG tablet Take 1 tablet (375 mg total) by mouth 2 (two) times daily with a meal. 03/11/21   Sanjuana Kava, MD  trimethoprim-polymyxin b (POLYTRIM) ophthalmic solution Place 1 drop into the left eye every 4 (four) hours. 09/07/21   Volney American, PA-C    Family History Family History  Problem Relation Age of Onset   Congestive Heart Failure Maternal Grandmother    Hypertension Maternal Grandmother    Diabetes Maternal Grandmother    Other Maternal Grandfather        aneursym   Kidney disease Maternal Grandfather        on dialysis   Congestive Heart Failure Maternal Grandfather    Diabetes Maternal Grandfather     Social History Social History   Tobacco  Use   Smoking status: Every Day    Packs/day: 0.25    Years: 17.00    Total pack years: 4.25    Types: Cigarettes   Smokeless tobacco: Never   Tobacco comments:    smokes 2-3 cig daily  Vaping Use   Vaping Use: Never used  Substance Use Topics   Alcohol use: Yes    Comment: socially   Drug use: No     Allergies   Patient has no known allergies.   Review of Systems Review of Systems Per HPI  Physical Exam Triage Vital Signs ED Triage Vitals [12/03/21 1826]  Enc Vitals Group     BP (!) 157/105     Pulse Rate 90     Resp 18     Temp 98.7 F (37.1 C)     Temp Source Oral     SpO2 95 %     Weight      Height      Head Circumference      Peak Flow      Pain Score 5     Pain Loc      Pain Edu?      Excl. in [place]?    No data  found.  Updated Vital Signs BP (!) 157/105 (BP Location: Right Arm)   Pulse 90   Temp 98.7 F (37.1 C) (Oral)   Resp 18   LMP 11/18/2021 (Approximate)   SpO2 95%   Visual Acuity Right Eye Distance:   Left Eye Distance:   Bilateral Distance:    Right Eye Near:   Left Eye Near:    Bilateral Near:     Physical Exam Vitals and nursing note reviewed.  Constitutional:      Appearance: Normal appearance.  HENT:     Head: Atraumatic.     Right Ear: Tympanic membrane and external ear normal.     Left Ear: Tympanic membrane and external ear normal.     Nose: Rhinorrhea present.     Mouth/Throat:     Mouth: Mucous membranes are moist.     Pharynx: Posterior oropharyngeal erythema present.  Eyes:     Extraocular Movements: Extraocular movements intact.     Conjunctiva/sclera: Conjunctivae normal.  Cardiovascular:     Rate and Rhythm: Normal rate and regular rhythm.     Heart sounds: Normal heart sounds.  Pulmonary:     Effort: Pulmonary effort is normal.     Breath sounds: Normal breath sounds. No wheezing or rales.  Abdominal:     General: Bowel sounds are normal. There is no distension.     Palpations: Abdomen is soft.     Tenderness: There is no abdominal tenderness. There is no guarding.  Musculoskeletal:        General: Normal range of motion.     Cervical back: Normal range of motion and neck supple.  Skin:    General: Skin is warm and dry.  Neurological:     Mental Status: She is alert and oriented to person, place, and time.  Psychiatric:        Mood and Affect: Mood normal.        Thought Content: Thought content normal.      UC Treatments / Results  Labs (all labs ordered are listed, but only abnormal results are displayed) Labs Reviewed  RESP PANEL BY RT-PCR (FLU A&B, COVID) [id number]    EKG   Radiology No results found.  Procedures Procedures (including critical care time)  Medications Ordered in UC Medications - No data to display  Initial  Impression / Assessment and Plan / UC Course  I have reviewed the triage vital signs and the nursing notes.  Pertinent labs & imaging results that were available during my care of the patient were reviewed by me and considered in my medical decision making (see chart for details).     COVID and flu testing pending, suspect viral upper respiratory infection.  Discussed ported over-the-counter medications, home care.  Return for worsening symptoms.  Work note given.  Final Clinical Impressions(s) / UC Diagnoses   Final diagnoses:  Viral URI with cough  Diarrhea, unspecified type   Discharge Instructions   None    ED Prescriptions   None    PDMP not reviewed this encounter.   Particia Nearing, [place] 12/03/21 1857

## 2021-12-03 NOTE — ED Triage Notes (Signed)
Scratchy throat and cough and ABD pain with frequent stools that started this morning.

## 2021-12-07 ENCOUNTER — Ambulatory Visit: Payer: BC Managed Care – PPO | Admitting: Orthopaedic Surgery

## 2022-01-07 ENCOUNTER — Ambulatory Visit
Admission: EM | Admit: 2022-01-07 | Discharge: 2022-01-07 | Disposition: A | Discharge status: Home / Self Care | Payer: BC Managed Care – PPO | Attending: Internal Medicine | Admitting: Internal Medicine

## 2022-01-07 ENCOUNTER — Encounter: Payer: Self-pay | Admitting: Emergency Medicine

## 2022-01-07 DIAGNOSIS — K529 Noninfective gastroenteritis and colitis, unspecified: Secondary | ICD-10-CM

## 2022-01-07 MED ORDER — DICYCLOMINE HCL 20 MG PO TABS: 20.0000 mg | ORAL_TABLET | Freq: Two times a day (BID) | ORAL | 0 refills | Status: DC | PRN

## 2022-01-07 MED ORDER — METRONIDAZOLE 500 MG PO TABS: 500.0000 mg | ORAL_TABLET | Freq: Two times a day (BID) | ORAL | 0 refills | Status: DC

## 2022-01-07 MED ORDER — CIPROFLOXACIN HCL 500 MG PO TABS: 500.0000 mg | ORAL_TABLET | Freq: Two times a day (BID) | ORAL | 0 refills | Status: DC

## 2022-01-07 NOTE — ED Triage Notes (Signed)
Abd pain since Saturday.  Diarrhea Sun and Monday.  Starting to have diarrhea again today.

## 2022-01-07 NOTE — ED Provider Notes (Signed)
[facility]    CSN: [id number] Arrival date & time: 01/07/22  1549      History   Chief Complaint No chief complaint on file.   HPI Renee [name] is a 41 y.o. female.   HPI  Past Medical History:  Diagnosis Date   Elevated serum creatinine 02/19/2021   Herpes simplex virus (HSV) infection    no OB +HSV 05/04/16 at [facility]    Hypertension    Hypothyroidism    Vaginal Pap smear, abnormal     Patient Active Problem List   Diagnosis Date Noted   Serum calcium elevated 02/19/2021   Elevated serum creatinine 02/19/2021   Screening mammogram for breast cancer 02/16/2021   Encounter for screening fecal occult blood testing 02/16/2021   Encounter for gynecological examination with Papanicolaou smear of cervix 02/16/2021   Cholelithiasis with chronic cholecystitis 04/13/2020   Cholelithiasis with cholecystitis 04/02/2020   Hyperparathyroidism, primary (HCC) 04/02/2020   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/2015    Past Surgical History:  Procedure Laterality Date   CHOLECYSTECTOMY N/A 04/13/2020   Procedure: LAPAROSCOPIC CHOLECYSTECTOMY WITH INTRAOPERATIVE CHOLANGIOGRAM;  Surgeon: Darnell Level, MD;  Location: [facility];  Service: General;  Laterality: N/A;   KNEE SURGERY     TONSILLECTOMY      OB History     Gravida  2   Para      Term      Preterm      AB  2   Living         SAB      IAB      Ectopic      Multiple      Live Births               Home Medications    Prior to Admission medications   Medication Sig Start Date End Date Taking? Authorizing Provider  ciprofloxacin (CIPRO) 500 MG tablet Take 1 tablet (500 mg total) by mouth every 12 (twelve) hours. 01/07/22  Yes Hazael Olveda, Britta Mccreedy, MD  dicyclomine (BENTYL) 20 MG tablet Take 1 tablet (20 mg total) by mouth 2 (two) times daily as needed for spasms. 01/07/22  Yes Megha Agnes, Britta Mccreedy, MD  metroNIDAZOLE (FLAGYL) 500 MG tablet Take 1 tablet (500 mg total) by mouth 2 (two) times daily for 5  days. 01/07/22 01/12/22 Yes Suleyman Ehrman, Britta Mccreedy, MD  amLODipine (NORVASC) 5 MG tablet Take 5 mg by mouth daily. 01/10/20   [provider]  Cholecalciferol (DIALYVITE VITAMIN D 5000) 125 MCG (5000 UT) capsule Take 5,000 Units by mouth daily.    [provider]  losartan (COZAAR) 100 MG tablet Take 100 mg by mouth daily.    [provider]  naproxen (NAPROSYN) 375 MG tablet Take 1 tablet (375 mg total) by mouth 2 (two) times daily with a meal. 03/11/21   Darreld Mclean, MD    Family History Family History  Problem Relation Age of Onset   Congestive Heart Failure Maternal Grandmother    Hypertension Maternal Grandmother    Diabetes Maternal Grandmother    Other Maternal Grandfather        aneursym   Kidney disease Maternal Grandfather        on dialysis   Congestive Heart Failure Maternal Grandfather    Diabetes Maternal Grandfather     Social History Social History   Tobacco Use   Smoking status: Every Day    Packs/day: 0.25    Years: 17.00    Total pack  years: 4.25    Types: Cigarettes   Smokeless tobacco: Never   Tobacco comments:    smokes 2-3 cig daily  Vaping Use   Vaping Use: Never used  Substance Use Topics   Alcohol use: Yes    Comment: socially   Drug use: No     Allergies   Patient has no known allergies.   Review of Systems Review of Systems   Physical Exam Triage Vital Signs ED Triage Vitals  Enc Vitals Group     BP 01/07/22 1559 (!) 149/85     Pulse Rate 01/07/22 1559 84     Resp 01/07/22 1559 16     Temp 01/07/22 1559 98.7 F (37.1 C)     Temp Source 01/07/22 1559 Oral     SpO2 01/07/22 1559 96 %     Weight --      Height --      Head Circumference --      Peak Flow --      Pain Score 01/07/22 1600 8     Pain Loc --      Pain Edu? --      Excl. in [place]? --    No data found.  Updated Vital Signs BP (!) 149/85 (BP Location: Right Arm)   Pulse 84   Temp 98.7 F (37.1 C) (Oral)   Resp 16   LMP 12/28/2021 (Exact  Date)   SpO2 96%   Visual Acuity Right Eye Distance:   Left Eye Distance:   Bilateral Distance:    Right Eye Near:   Left Eye Near:    Bilateral Near:     Physical Exam   UC Treatments / Results  Labs (all labs ordered are listed, but only abnormal results are displayed) Labs Reviewed - No data to display  EKG   Radiology No results found.  Procedures Procedures (including critical care time)  Medications Ordered in UC Medications - No data to display  Initial Impression / Assessment and Plan / UC Course  I have reviewed the triage vital signs and the nursing notes.  Pertinent labs & imaging results that were available during my care of the patient were reviewed by me and considered in my medical decision making (see chart for details).     *** Final Clinical Impressions(s) / UC Diagnoses   Final diagnoses:  Colitis     Discharge Instructions      This increase oral fluid intake Please return a stool sample to be taken to the lab Take medications as prescribed If you have worsening abdominal pain, abdominal bloating, persistent vomiting or nausea please return to urgent care to be reevaluated.   ED Prescriptions     Medication Sig Dispense Auth. Provider   dicyclomine (BENTYL) 20 MG tablet Take 1 tablet (20 mg total) by mouth 2 (two) times daily as needed for spasms. 20 tablet Gregg Winchell, Myrene Galas, MD   ciprofloxacin (CIPRO) 500 MG tablet Take 1 tablet (500 mg total) by mouth every 12 (twelve) hours. 10 tablet Mahlani Berninger, Myrene Galas, MD   metroNIDAZOLE (FLAGYL) 500 MG tablet Take 1 tablet (500 mg total) by mouth 2 (two) times daily for 5 days. 10 tablet Karman Veney, Myrene Galas, MD      PDMP not reviewed this encounter.

## 2022-01-07 NOTE — Discharge Instructions (Addendum)
This increase oral fluid intake Please return a stool sample to be taken to the lab Take medications as prescribed If you have worsening abdominal pain, abdominal bloating, persistent vomiting or nausea please return to urgent care to be reevaluated.

## 2022-01-11 ENCOUNTER — Encounter (HOSPITAL_COMMUNITY): Payer: Self-pay | Admitting: Emergency Medicine

## 2022-01-11 ENCOUNTER — Emergency Department (HOSPITAL_COMMUNITY): Payer: BC Managed Care – PPO

## 2022-01-11 ENCOUNTER — Telehealth: Payer: Self-pay | Admitting: Nurse Practitioner

## 2022-01-11 ENCOUNTER — Other Ambulatory Visit: Payer: Self-pay

## 2022-01-11 ENCOUNTER — Inpatient Hospital Stay (HOSPITAL_COMMUNITY)
Admission: EM | Admit: 2022-01-11 | Discharge: 2022-01-14 | DRG: 439 | Disposition: A | Discharge status: Home / Self Care | Payer: BC Managed Care – PPO | Attending: Internal Medicine | Admitting: Internal Medicine

## 2022-01-11 DIAGNOSIS — D3500 Benign neoplasm of unspecified adrenal gland: Secondary | ICD-10-CM | POA: Diagnosis not present

## 2022-01-11 DIAGNOSIS — N390 Urinary tract infection, site not specified: Secondary | ICD-10-CM

## 2022-01-11 DIAGNOSIS — E210 Primary hyperparathyroidism: Secondary | ICD-10-CM | POA: Diagnosis not present

## 2022-01-11 DIAGNOSIS — N3000 Acute cystitis without hematuria: Secondary | ICD-10-CM | POA: Diagnosis not present

## 2022-01-11 DIAGNOSIS — K861 Other chronic pancreatitis: Secondary | ICD-10-CM | POA: Diagnosis present

## 2022-01-11 DIAGNOSIS — E039 Hypothyroidism, unspecified: Secondary | ICD-10-CM | POA: Diagnosis present

## 2022-01-11 DIAGNOSIS — I10 Essential (primary) hypertension: Secondary | ICD-10-CM | POA: Diagnosis not present

## 2022-01-11 DIAGNOSIS — K8591 Acute pancreatitis with uninfected necrosis, unspecified: Secondary | ICD-10-CM | POA: Diagnosis not present

## 2022-01-11 DIAGNOSIS — R7989 Other specified abnormal findings of blood chemistry: Secondary | ICD-10-CM

## 2022-01-11 DIAGNOSIS — N200 Calculus of kidney: Secondary | ICD-10-CM | POA: Diagnosis not present

## 2022-01-11 DIAGNOSIS — Z8249 Family history of ischemic heart disease and other diseases of the circulatory system: Secondary | ICD-10-CM | POA: Diagnosis not present

## 2022-01-11 DIAGNOSIS — K858 Other acute pancreatitis without necrosis or infection: Principal | ICD-10-CM | POA: Diagnosis present

## 2022-01-11 DIAGNOSIS — K8689 Other specified diseases of pancreas: Secondary | ICD-10-CM | POA: Diagnosis not present

## 2022-01-11 DIAGNOSIS — Z79899 Other long term (current) drug therapy: Secondary | ICD-10-CM | POA: Diagnosis not present

## 2022-01-11 DIAGNOSIS — K859 Acute pancreatitis without necrosis or infection, unspecified: Secondary | ICD-10-CM | POA: Diagnosis not present

## 2022-01-11 DIAGNOSIS — Z833 Family history of diabetes mellitus: Secondary | ICD-10-CM

## 2022-01-11 DIAGNOSIS — F17210 Nicotine dependence, cigarettes, uncomplicated: Secondary | ICD-10-CM | POA: Diagnosis not present

## 2022-01-11 DIAGNOSIS — Z6841 Body Mass Index (BMI) 40.0 and over, adult: Secondary | ICD-10-CM

## 2022-01-11 DIAGNOSIS — K529 Noninfective gastroenteritis and colitis, unspecified: Secondary | ICD-10-CM

## 2022-01-11 LAB — I-STAT BETA HCG BLOOD, ED (MC, WL, AP ONLY): I-stat hCG, quantitative: <5 m[IU]/mL (ref <5)

## 2022-01-11 LAB — URINALYSIS, ROUTINE W REFLEX MICROSCOPIC
Bilirubin Urine: NEGATIVE
Glucose, UA: NEGATIVE mg/dL
Hgb urine dipstick: NEGATIVE
Ketones, ur: NEGATIVE mg/dL
Nitrite: NEGATIVE
Protein, ur: 30 mg/dL — AB
Specific Gravity, Urine: 1.019 (ref 1.005–1.030)
pH: 5 (ref 5.0–8.0)

## 2022-01-11 LAB — CBC WITH DIFFERENTIAL/PLATELET
Abs Immature Granulocytes: 0.02 10*3/uL (ref 0.00–0.07)
Basophils Absolute: 0 10*3/uL (ref 0.0–0.1)
Basophils Relative: 0 %
Eosinophils Absolute: 0.1 10*3/uL (ref 0.0–0.5)
Eosinophils Relative: 1 %
HCT: 42.2 % (ref 36.0–46.0)
Hemoglobin: 13.7 g/dL (ref 12.0–15.0)
Immature Granulocytes: 0 %
Lymphocytes Relative: 19 %
Lymphs Abs: 1.2 10*3/uL (ref 0.7–4.0)
MCH: 28.5 pg (ref 26.0–34.0)
MCHC: 32.5 g/dL (ref 30.0–36.0)
MCV: 87.9 fL (ref 80.0–100.0)
Monocytes Absolute: 0.7 10*3/uL (ref 0.1–1.0)
Monocytes Relative: 11 %
Neutro Abs: 4.3 10*3/uL (ref 1.7–7.7)
Neutrophils Relative %: 69 %
Platelets: 328 10*3/uL (ref 150–400)
RBC: 4.8 MIL/uL (ref 3.87–5.11)
RDW: 12.4 % (ref 11.5–15.5)
WBC: 6.3 10*3/uL (ref 4.0–10.5)
nRBC: 0 % (ref 0.0–0.2)

## 2022-01-11 LAB — COMPREHENSIVE METABOLIC PANEL
ALT: 21 U/L (ref 0–44)
AST: 18 U/L (ref 15–41)
Albumin: 3.7 g/dL (ref 3.5–5.0)
Alkaline Phosphatase: 88 U/L (ref 38–126)
Anion gap: 6 (ref 5–15)
BUN: 9 mg/dL (ref 6–20)
CO2: 25 mmol/L (ref 22–32)
Calcium: 11.8 mg/dL — ABNORMAL HIGH (ref 8.9–10.3)
Chloride: 107 mmol/L (ref 98–111)
Creatinine, Ser: 0.94 mg/dL (ref 0.44–1.00)
GFR, Estimated: >60 mL/min (ref >60)
Glucose, Bld: 130 mg/dL — ABNORMAL HIGH (ref 70–99)
Potassium: 4 mmol/L (ref 3.5–5.1)
Sodium: 138 mmol/L (ref 135–145)
Total Bilirubin: 0.7 mg/dL (ref 0.3–1.2)
Total Protein: 7.3 g/dL (ref 6.5–8.1)

## 2022-01-11 LAB — LIPASE, BLOOD: Lipase: 35 U/L (ref 11–51)

## 2022-01-11 MED ORDER — SODIUM CHLORIDE 0.9 % IV SOLN: INTRAVENOUS | Status: DC

## 2022-01-11 MED ORDER — KETOROLAC TROMETHAMINE 15 MG/ML IJ SOLN
15.0000 mg | Freq: Once | INTRAMUSCULAR | Status: AC
  Administered 2022-01-11: 15 mg via INTRAVENOUS
  Filled 2022-01-11: qty 1

## 2022-01-11 MED ORDER — ACETAMINOPHEN 325 MG PO TABS: 650.0000 mg | ORAL_TABLET | Freq: Four times a day (QID) | ORAL | Status: DC | PRN

## 2022-01-11 MED ORDER — AMLODIPINE BESYLATE 5 MG PO TABS
5.0000 mg | ORAL_TABLET | Freq: Every day | ORAL | Status: DC
  Administered 2022-01-12 – 2022-01-14 (×3): 5 mg via ORAL
  Filled 2022-01-11 (×3): qty 1

## 2022-01-11 MED ORDER — MORPHINE SULFATE (PF) 2 MG/ML IV SOLN
2.0000 mg | INTRAVENOUS | Status: DC | PRN
  Administered 2022-01-12 (×3): 2 mg via INTRAVENOUS
  Filled 2022-01-11 (×3): qty 1

## 2022-01-11 MED ORDER — ONDANSETRON HCL 4 MG/2ML IJ SOLN
4.0000 mg | Freq: Four times a day (QID) | INTRAMUSCULAR | Status: DC | PRN
  Administered 2022-01-13: 4 mg via INTRAVENOUS
  Filled 2022-01-11: qty 2

## 2022-01-11 MED ORDER — HEPARIN SODIUM (PORCINE) 5000 UNIT/ML IJ SOLN
5000.0000 [IU] | Freq: Three times a day (TID) | INTRAMUSCULAR | Status: DC
  Administered 2022-01-11 – 2022-01-14 (×7): 5000 [IU] via SUBCUTANEOUS
  Filled 2022-01-11 (×7): qty 1

## 2022-01-11 MED ORDER — LOSARTAN POTASSIUM 50 MG PO TABS
100.0000 mg | ORAL_TABLET | Freq: Every day | ORAL | Status: DC
  Administered 2022-01-12 – 2022-01-14 (×3): 100 mg via ORAL
  Filled 2022-01-11 (×3): qty 2

## 2022-01-11 MED ORDER — IOHEXOL 300 MG/ML  SOLN
100.0000 mL | Freq: Once | INTRAMUSCULAR | Status: AC | PRN
  Administered 2022-01-11: 100 mL via INTRAVENOUS

## 2022-01-11 MED ORDER — SODIUM CHLORIDE 0.9 % IV SOLN
1.0000 g | INTRAVENOUS | Status: DC
  Administered 2022-01-11 – 2022-01-13 (×3): 1 g via INTRAVENOUS
  Filled 2022-01-11 (×3): qty 10

## 2022-01-11 MED ORDER — LACTATED RINGERS IV BOLUS
1000.0000 mL | Freq: Once | INTRAVENOUS | Status: AC
  Administered 2022-01-11: 1000 mL via INTRAVENOUS

## 2022-01-11 MED ORDER — ONDANSETRON HCL 4 MG PO TABS
4.0000 mg | ORAL_TABLET | Freq: Four times a day (QID) | ORAL | Status: DC | PRN
  Administered 2022-01-13: 4 mg via ORAL
  Filled 2022-01-11: qty 1

## 2022-01-11 MED ORDER — ACETAMINOPHEN 650 MG RE SUPP: 650.0000 mg | Freq: Four times a day (QID) | RECTAL | Status: DC | PRN

## 2022-01-11 MED ORDER — OXYCODONE HCL 5 MG PO TABS
5.0000 mg | ORAL_TABLET | ORAL | Status: DC | PRN
  Administered 2022-01-11 – 2022-01-13 (×6): 5 mg via ORAL
  Filled 2022-01-11 (×6): qty 1

## 2022-01-11 NOTE — ED Triage Notes (Signed)
Pt presents with abdominal pain with constipation and diarrhea, x 1 week.

## 2022-01-11 NOTE — Discharge Instructions (Addendum)
Adrenal nodule**

## 2022-01-11 NOTE — Telephone Encounter (Signed)
Reordering stool studies

## 2022-01-11 NOTE — ED Provider Notes (Signed)
[facility] EMERGENCY DEPARTMENT Provider Note   CSN: [id number] Arrival date & time: 01/11/22  1513     History Chief Complaint  Patient presents with   Abdominal Pain    Renee [name] is a 41 y.o. female with history of hyperparathyroidism, pretension presents the emergency department for evaluation of left upper quadrant and left lower quadrant pain for the past week.  She reports that its mainly hurts in that area but does kind of hurt everywhere.  She reports she has had stool that is admixed between diarrhea and constipation.  She reports it starts out as diarrhea then will become hard stools to pass, and then resumes as diarrhea.  She denies any dark stools or mentions whenever she does have diarrhea it is a orange/red color but no frank blood in her she wipes.  She denies any nausea, vomiting, fever, chills, chest pain, shortness of breath, dysuria, hematuria, vaginal discharge, vaginal bleeding, back pain.  She reports that she was seen at the urgent care on Friday and and was told to provide a stool sample.  She reports that she was given medications on Friday however did not pick them up due to cost.  She has not trialed any medications given that they normally have calcium in it and she has to restrict herself in calcium.  She had a cholecystectomy before.  She is a tobacco user, however she reports that she quit smoking last week.  She reports that she has been in [place] the week prior but reports that she is eating but not any different from her usual diet.   Abdominal Pain Associated symptoms: constipation and diarrhea   Associated symptoms: no chest pain, no chills, no dysuria, no fever, no hematuria, no nausea, no shortness of breath, no vaginal bleeding, no vaginal discharge and no vomiting        Home Medications Prior to Admission medications   Medication Sig Start Date End Date Taking? Authorizing Provider  amLODipine (NORVASC) 5 MG tablet Take 5 mg by mouth daily.  01/10/20  Yes [provider]  Cholecalciferol (DIALYVITE VITAMIN D 5000) 125 MCG (5000 UT) capsule Take 5,000 Units by mouth daily.   Yes [provider]  losartan (COZAAR) 100 MG tablet Take 100 mg by mouth daily.   Yes [provider]  naproxen (NAPROSYN) 375 MG tablet Take 1 tablet (375 mg total) by mouth 2 (two) times daily with a meal. 03/11/21  Yes Darreld Mclean, MD  ciprofloxacin (CIPRO) 500 MG tablet Take 1 tablet (500 mg total) by mouth every 12 (twelve) hours. 01/07/22   Lamptey, Britta Mccreedy, MD  dicyclomine (BENTYL) 20 MG tablet Take 1 tablet (20 mg total) by mouth 2 (two) times daily as needed for spasms. 01/07/22   Lamptey, Britta Mccreedy, MD  metroNIDAZOLE (FLAGYL) 500 MG tablet Take 1 tablet (500 mg total) by mouth 2 (two) times daily for 5 days. 01/07/22 01/12/22  LampteyBritta Mccreedy, MD      Allergies    Patient has no known allergies.    Review of Systems   Review of Systems  Constitutional:  Negative for chills and fever.  Respiratory:  Negative for shortness of breath.   Cardiovascular:  Negative for chest pain.  Gastrointestinal:  Positive for abdominal pain, constipation and diarrhea. Negative for nausea and vomiting.  Genitourinary:  Negative for dysuria, hematuria, vaginal bleeding, vaginal discharge and vaginal pain.  Musculoskeletal:  Negative for back pain.    Physical Exam Updated Vital Signs  BP (!) 164/106 (BP Location: Right Arm)   Pulse 97   Temp 98.7 F (37.1 C) (Oral)   Resp 20   Ht 5\' 1"  (1.549 m)   Wt 117.9 kg   LMP 12/28/2021 (Exact Date)   SpO2 97%   BMI 49.13 kg/m  Physical Exam Vitals and nursing note reviewed.  Constitutional:      General: She is not in acute distress.    Appearance: Normal appearance.  HENT:     Head: Normocephalic and atraumatic.  Eyes:     General: No scleral icterus. Cardiovascular:     Rate and Rhythm: Normal rate and regular rhythm.  Pulmonary:     Effort: Pulmonary effort is normal.     Breath  sounds: Normal breath sounds.  Abdominal:     General: Bowel sounds are normal. There is no distension.     Palpations: Abdomen is soft.     Tenderness: There is abdominal tenderness in the left upper quadrant and left lower quadrant. There is no guarding or rebound.     Comments: Abdominal exam limited secondary to body habitus.  Patient has diffuse tenderness although mainly in the left upper and left lower quadrant.  No guarding or rebound noted.  Normal active bowel sounds.  Musculoskeletal:        General: No deformity.     Cervical back: Normal range of motion.  Skin:    General: Skin is warm and dry.  Neurological:     General: No focal deficit present.     Mental Status: She is alert. Mental status is at baseline.     ED Results / Procedures / Treatments   Labs (all labs ordered are listed, but only abnormal results are displayed) Labs Reviewed  COMPREHENSIVE METABOLIC PANEL - Abnormal; Notable for the following components:      Result Value   Glucose, Bld 130 (*)    Calcium 11.8 (*)    All other components within normal limits  URINALYSIS, ROUTINE W REFLEX MICROSCOPIC - Abnormal; Notable for the following components:   APPearance TURBID (*)    Protein, ur 30 (*)    Leukocytes,Ua LARGE (*)    Bacteria, UA FEW (*)    All other components within normal limits  URINE CULTURE  LIPASE, BLOOD  CBC WITH DIFFERENTIAL/PLATELET  I-STAT BETA HCG BLOOD, ED ([facility], [facility], [facility] ONLY)    EKG None  Radiology CT ABDOMEN PELVIS W CONTRAST  Result Date: 01/11/2022 CLINICAL DATA:  Left-sided abdominal pain with diarrhea x1 week EXAM: CT ABDOMEN AND PELVIS WITH CONTRAST TECHNIQUE: Multidetector CT imaging of the abdomen and pelvis was performed using the standard protocol following bolus administration of intravenous contrast. RADIATION DOSE REDUCTION: This exam was performed according to the departmental dose-optimization program which includes automated exposure control, adjustment of the mA  and/or kV according to patient size and/or use of iterative reconstruction technique. CONTRAST:  162mL OMNIPAQUE IOHEXOL 300 MG/ML  SOLN COMPARISON:  Right upper quadrant ultrasound February 28, 2020 FINDINGS: Lower chest: No acute abnormality. Hepatobiliary: No suspicious hepatic lesion. Gallbladder surgically absent. No biliary ductal dilation. Pancreas: Inflammation of the pancreatic body and tail with peripancreatic fluid and ductal dilation to the level of a 10 mm calcification in the pancreatic neck on image 21/2. No walled off fluid collections. No evidence of necrosis. Spleen: No splenomegaly. Adrenals/Urinary Tract: Left adrenal nodule measures 2 cm with Hounsfield units of 82. Right adrenal gland appears normal. Nonobstructive right renal calculi measure up to 3 mm.  No obstructive ureteral or bladder calculi identified. Urinary bladder is unremarkable for degree of distension. Stomach/Bowel: No radiopaque enteric contrast material was administered. Stomach is unremarkable for degree of distension. No pathologic dilation of small or large bowel. The appendix and terminal ileum appear normal. No evidence of acute bowel inflammation. Vascular/Lymphatic: Normal caliber abdominal aorta. No pathologically enlarged abdominal or pelvic lymph nodes. Reproductive: Gas in the vagina. Uterus and bilateral adnexa are unremarkable. Other: No walled off fluid collections. No pneumoperitoneum. No portal venous gas. Musculoskeletal: No acute osseous abnormality. IMPRESSION: 1. Acute interstitial pancreatitis involving the body and tail of the pancreas. No walled off fluid collections. 2. Calcification in the pancreatic neck which appears to be located within the pancreatic duct with upstream dilation of the duct and corresponding pancreatic atrophy in the body and tail. Constellation of findings which are most consistent with sequela of chronic pancreatitis and ductal obstruction. Suggest GI consult and further evaluation  by nonemergent MRCP with and without contrast preferably as an outpatient upon resolution of patient's current symptomatology when they are better able to follow commands including breath hold. 3. Nonobstructive right renal calculi measure up to 3 mm. 4. Indeterminate 2 cm left adrenal nodule, probably reflecting a benign adenoma. Recommend follow-up adrenal washout CT in 1 year. If stable for = 1 year, no further follow-up imaging. JACR 2017 Aug; 14(8):1038-44, JCAT 2016 Mar-Apr; 40(2):194-200, Urol J 2006 Spring; 3(2):71-4. Electronically Signed   By: Maudry Mayhew M.D.   On: 01/11/2022 18:58    Procedures Procedures   Medications Ordered in ED Medications  iohexol (OMNIPAQUE) 300 MG/ML solution 100 mL (100 mLs Intravenous Contrast Given 01/11/22 1842)  lactated ringers bolus 1,000 mL (1,000 mLs Intravenous New Bag/Given 01/11/22 1948)  ketorolac (TORADOL) 15 MG/ML injection 15 mg (15 mg Intravenous Given 01/11/22 1948)    ED Course/ Medical Decision Making/ A&P                           Medical Decision Making Amount and/or Complexity of Data Reviewed Labs: ordered. Radiology: ordered.  Risk Prescription drug management.   41 year old female presents the emergency room for evaluation of abdominal pain.  Differential diagnosis includes but is not limited to gastritis, colitis, diverticulitis, viral gastroenteritis, infectious gastroenteritis, pancreatitis.  Vital signs show elevated blood pressure 164/106 otherwise afebrile, normal pulse rate, satting well room air without increased work of breathing.  Physical exam as noted above.  We will order labs and CT imaging given patient's pain.  I independent reviewed and interpreted the patient's labs.  Urinalysis shows turbid urine with 30 protein and large leukocytes with 21-50 white blood cells however only few bacteria.  There is 11-20 squamous epithelial present.  Likely dirty catch.  We will culture.  CBC without leukocytosis or anemia.   CMP shows elevated glucose at 130.  Calcium elevated 11.8.  This appears to be chronic for the patient and fits her diagnosis of hyperparathyroidism.  Lipase is normal.  hCG is negative.  Toradol and 1 L LR given.  CT imaging shows IMPRESSION: 1. Acute interstitial pancreatitis involving the body and tail of the pancreas. No walled off fluid collections. 2. Calcification in the pancreatic neck which appears to be located within the pancreatic duct with upstream dilation of the duct and corresponding pancreatic atrophy in the body and tail. Constellation of findings which are most consistent with sequela of chronic pancreatitis and ductal obstruction. Suggest GI consult and further evaluation by nonemergent  MRCP with and without contrast preferably as an outpatient upon resolution of patient's current symptomatology when they are better able to follow commands including breath hold. 3. Nonobstructive right renal calculi measure up to 3 mm. 4. Indeterminate 2 cm left adrenal nodule, probably reflecting a benign adenoma. Recommend follow-up adrenal washout CT in 1 year.  Will consult GI. Spoke with Dr. Marletta Lor who recommended patient for pancreatitis and follow-up outpatient for possible ERCP or MRCP.  Discussed with patient who is amenable to admission.  We will admit to Dr. Carren Rang.   Final Clinical Impression(s) / ED Diagnoses Final diagnoses:  Acute pancreatitis, unspecified complication status, unspecified pancreatitis type    Rx / DC Orders ED Discharge Orders     None         Achille Rich, PA-C 01/11/22 1959    Lonell Grandchild, MD 01/11/22 2245

## 2022-01-12 ENCOUNTER — Observation Stay (HOSPITAL_COMMUNITY): Payer: BC Managed Care – PPO

## 2022-01-12 DIAGNOSIS — K8591 Acute pancreatitis with uninfected necrosis, unspecified: Secondary | ICD-10-CM | POA: Diagnosis not present

## 2022-01-12 DIAGNOSIS — Z79899 Other long term (current) drug therapy: Secondary | ICD-10-CM | POA: Diagnosis not present

## 2022-01-12 DIAGNOSIS — K8689 Other specified diseases of pancreas: Secondary | ICD-10-CM | POA: Diagnosis not present

## 2022-01-12 DIAGNOSIS — F17210 Nicotine dependence, cigarettes, uncomplicated: Secondary | ICD-10-CM | POA: Diagnosis present

## 2022-01-12 DIAGNOSIS — Z6841 Body Mass Index (BMI) 40.0 and over, adult: Secondary | ICD-10-CM | POA: Diagnosis not present

## 2022-01-12 DIAGNOSIS — E210 Primary hyperparathyroidism: Secondary | ICD-10-CM

## 2022-01-12 DIAGNOSIS — K859 Acute pancreatitis without necrosis or infection, unspecified: Secondary | ICD-10-CM | POA: Diagnosis present

## 2022-01-12 DIAGNOSIS — I10 Essential (primary) hypertension: Secondary | ICD-10-CM

## 2022-01-12 DIAGNOSIS — E039 Hypothyroidism, unspecified: Secondary | ICD-10-CM | POA: Diagnosis present

## 2022-01-12 DIAGNOSIS — N390 Urinary tract infection, site not specified: Secondary | ICD-10-CM | POA: Diagnosis present

## 2022-01-12 DIAGNOSIS — K861 Other chronic pancreatitis: Secondary | ICD-10-CM | POA: Diagnosis not present

## 2022-01-12 DIAGNOSIS — D3500 Benign neoplasm of unspecified adrenal gland: Secondary | ICD-10-CM | POA: Diagnosis not present

## 2022-01-12 DIAGNOSIS — Z8249 Family history of ischemic heart disease and other diseases of the circulatory system: Secondary | ICD-10-CM | POA: Diagnosis not present

## 2022-01-12 DIAGNOSIS — K858 Other acute pancreatitis without necrosis or infection: Secondary | ICD-10-CM | POA: Diagnosis not present

## 2022-01-12 DIAGNOSIS — N3000 Acute cystitis without hematuria: Secondary | ICD-10-CM

## 2022-01-12 DIAGNOSIS — R7989 Other specified abnormal findings of blood chemistry: Secondary | ICD-10-CM | POA: Diagnosis not present

## 2022-01-12 DIAGNOSIS — Z833 Family history of diabetes mellitus: Secondary | ICD-10-CM | POA: Diagnosis not present

## 2022-01-12 LAB — LIPID PANEL
Cholesterol: 128 mg/dL (ref 0–200)
HDL: 48 mg/dL (ref >40)
LDL Cholesterol: 67 mg/dL (ref 0–99)
Total CHOL/HDL Ratio: 2.7 RATIO
Triglycerides: 66 mg/dL (ref <150)
VLDL: 13 mg/dL (ref 0–40)

## 2022-01-12 LAB — CBC WITH DIFFERENTIAL/PLATELET
Abs Immature Granulocytes: 0.02 10*3/uL (ref 0.00–0.07)
Basophils Absolute: 0 10*3/uL (ref 0.0–0.1)
Basophils Relative: 0 %
Eosinophils Absolute: 0.1 10*3/uL (ref 0.0–0.5)
Eosinophils Relative: 1 %
HCT: 37.5 % (ref 36.0–46.0)
Hemoglobin: 12.1 g/dL (ref 12.0–15.0)
Immature Granulocytes: 0 %
Lymphocytes Relative: 14 %
Lymphs Abs: 0.9 10*3/uL (ref 0.7–4.0)
MCH: 28.5 pg (ref 26.0–34.0)
MCHC: 32.3 g/dL (ref 30.0–36.0)
MCV: 88.4 fL (ref 80.0–100.0)
Monocytes Absolute: 0.8 10*3/uL (ref 0.1–1.0)
Monocytes Relative: 12 %
Neutro Abs: 4.9 10*3/uL (ref 1.7–7.7)
Neutrophils Relative %: 73 %
Platelets: 288 10*3/uL (ref 150–400)
RBC: 4.24 MIL/uL (ref 3.87–5.11)
RDW: 12.6 % (ref 11.5–15.5)
WBC: 6.6 10*3/uL (ref 4.0–10.5)
nRBC: 0 % (ref 0.0–0.2)

## 2022-01-12 LAB — GASTROINTESTINAL PANEL BY PCR, STOOL (REPLACES STOOL CULTURE)

## 2022-01-12 LAB — COMPREHENSIVE METABOLIC PANEL
ALT: 182 U/L — ABNORMAL HIGH (ref 0–44)
AST: 446 U/L — ABNORMAL HIGH (ref 15–41)
Albumin: 3.2 g/dL — ABNORMAL LOW (ref 3.5–5.0)
Alkaline Phosphatase: 117 U/L (ref 38–126)
Anion gap: 9 (ref 5–15)
BUN: 7 mg/dL (ref 6–20)
CO2: 20 mmol/L — ABNORMAL LOW (ref 22–32)
Calcium: 11.1 mg/dL — ABNORMAL HIGH (ref 8.9–10.3)
Chloride: 109 mmol/L (ref 98–111)
Creatinine, Ser: 0.88 mg/dL (ref 0.44–1.00)
GFR, Estimated: >60 mL/min (ref >60)
Glucose, Bld: 110 mg/dL — ABNORMAL HIGH (ref 70–99)
Potassium: 3.7 mmol/L (ref 3.5–5.1)
Sodium: 138 mmol/L (ref 135–145)
Total Bilirubin: 0.8 mg/dL (ref 0.3–1.2)
Total Protein: 6.4 g/dL — ABNORMAL LOW (ref 6.5–8.1)

## 2022-01-12 LAB — HIV ANTIBODY (ROUTINE TESTING W REFLEX): HIV Screen 4th Generation wRfx: NONREACTIVE

## 2022-01-12 LAB — MAGNESIUM: Magnesium: 2.2 mg/dL (ref 1.7–2.4)

## 2022-01-12 LAB — PHOSPHORUS: Phosphorus: 2 mg/dL — ABNORMAL LOW (ref 2.5–4.6)

## 2022-01-12 MED ORDER — FENTANYL CITRATE PF 50 MCG/ML IJ SOSY
50.0000 ug | PREFILLED_SYRINGE | Freq: Once | INTRAMUSCULAR | Status: AC
  Administered 2022-01-12: 50 ug via INTRAVENOUS
  Filled 2022-01-12: qty 1

## 2022-01-12 MED ORDER — GADOBUTROL 1 MMOL/ML IV SOLN
10.0000 mL | Freq: Once | INTRAVENOUS | Status: AC | PRN
  Administered 2022-01-12: 10 mL via INTRAVENOUS

## 2022-01-12 NOTE — TOC Progression Note (Signed)
  Transition of Care (TOC) Screening Note   Patient Details  Name: Renee [name] Date of Birth: 1980/12/20   Transition of Care [facility]) CM/SW Contact:    Boneta Lucks, RN Phone Number: 01/12/2022, 10:08 AM    Transition of Care Department [facility]) has reviewed patient and no TOC needs have been identified at this time. We will continue to monitor patient advancement through interdisciplinary progression rounds. If new patient transition needs arise, please place a TOC consult.      Barriers to Discharge: Continued Medical Work up

## 2022-01-12 NOTE — Assessment & Plan Note (Addendum)
-   Acute pancreatitis - CT scan reads dilation of the duct and calcification present as well - Check lipid panel - Check MRCP - GI consulted - N.p.o., IV fluids - Pain control - Continue to monitor

## 2022-01-12 NOTE — Assessment & Plan Note (Signed)
Continue Norvasc and losartan 

## 2022-01-12 NOTE — Consult Note (Signed)
Referring Provider: No ref. provider found Primary Care Physician:  Pcp, No Primary Gastroenterologist:  not previously established   Date of Admission: 01/11/22 Date of Consultation: 01/12/22  Reason for Consultation:  acute pancreatitis  HPI:  Renee [name] is a 41 y.o. year old female with history of hyperparathyroidism, HTN, HSV who presented to the ED yesterday with left sided abdominal pain for the past week with noted stools ranging from diarrhea to constipation. Endorses orange colored stools. Seen at [facility] on Friday, though did not get the medication prescribed due to cost. Recent cessation of tobacco last week.   ED Course:  Lipase 35, LFTs WNL Calcium 11.8, CBC WNL  -GI path panel pending  -CT A/P with contrast Acute interstitial pancreatitis involving the body and tail of the pancreas. No walled off fluid collections. Calcification in the pancreatic neck which appears to be located within the pancreatic duct with upstream dilation of the duct and corresponding pancreatic atrophy in the body and tail. Constellation of findings which are most consistent with sequela of chronic pancreatitis and ductal obstruction.   -MRCP: Findings of acute on chronic pancreatitis with ductal obstruction in the central pancreas in the pancreatic body, resultant near complete atrophy of peripheral pancreas as seen on recent CT imaging. No acute pancreatic fluid collection. No signs of splenic vein or portal vein abnormality. Known intraductal calculus not seen currently. Preservation of T1 signal -Constellation of findings may be due to previous pancreatitis with ductal stricture and subsequent stone formation. Alternatively, occult neoplasm leading to ductal obstruction and these findings cannot be excluded on the basis of this exam. Follow-up endoscopic assessment may be helpful to exclude small occult neoplasm. -Mild hepatic steatosis.  Consult:  Patient states that she had sudden onset of abdominal  pain and diarrhea that began Saturday. Denies nausea or vomiting. Denies radiation of pain, localized to mid epigastric area. She notes similar pain prior to cholecystectomy about 1-1.5 years ago, notes she did have cholelithiasis at that time. Denies fevers or chills.  Denies alcohol use. Previously smoking 3-4 cigarettes per day but none since last week. Denies any history of pancreatitis in the past. Denies any new medications, no herbal supplements or energy drinks. Diarrhea has mostly resolved. No rectal bleeding or melena.   Notably maintained naproxyn 375 mg once daily for knee pain, has been on this or the past few months, initially on BID dosing but this was upsetting her stomach so she cut back to once a day.   Past Medical History:  Diagnosis Date   Elevated serum creatinine 02/19/2021   Herpes simplex virus (HSV) infection    no OB +HSV 05/04/16 at [facility]    Hypertension    Hypothyroidism    Vaginal Pap smear, abnormal     Past Surgical History:  Procedure Laterality Date   CHOLECYSTECTOMY N/A 04/13/2020   Procedure: LAPAROSCOPIC CHOLECYSTECTOMY WITH INTRAOPERATIVE CHOLANGIOGRAM;  Surgeon: Armandina Gemma, MD;  Location: [facility];  Service: General;  Laterality: N/A;   KNEE SURGERY     TONSILLECTOMY      Prior to Admission medications   Medication Sig Start Date End Date Taking? Authorizing Provider  amLODipine (NORVASC) 5 MG tablet Take 5 mg by mouth daily. 01/10/20  Yes [provider]  Cholecalciferol (DIALYVITE VITAMIN D 5000) 125 MCG (5000 UT) capsule Take 5,000 Units by mouth daily.   Yes [provider]  losartan (COZAAR) 100 MG tablet Take 100 mg by mouth daily.   Yes [provider]  naproxen (NAPROSYN) 375 MG tablet Take 1 tablet (375 mg total) by mouth 2 (two) times daily with a meal. 03/11/21  Yes Sanjuana Kava, MD  ciprofloxacin (CIPRO) 500 MG tablet Take 1 tablet (500 mg total) by mouth every 12 (twelve) hours. 01/07/22   Lamptey, Myrene Galas, MD   dicyclomine (BENTYL) 20 MG tablet Take 1 tablet (20 mg total) by mouth 2 (two) times daily as needed for spasms. 01/07/22   Lamptey, Myrene Galas, MD  metroNIDAZOLE (FLAGYL) 500 MG tablet Take 1 tablet (500 mg total) by mouth 2 (two) times daily for 5 days. 01/07/22 01/12/22  Chase Picket, MD    Current Facility-Administered Medications  Medication Dose Route Frequency Provider Last Rate Last Admin   0.9 %  sodium chloride infusion   Intravenous Continuous Heath Lark D, DO 125 mL/hr at 01/12/22 0856 New Bag at 01/12/22 0856   acetaminophen (TYLENOL) tablet 650 mg  650 mg Oral Q6H PRN Zierle-Ghosh, Asia B, DO       Or   acetaminophen (TYLENOL) suppository 650 mg  650 mg Rectal Q6H PRN Zierle-Ghosh, Asia B, DO       amLODipine (NORVASC) tablet 5 mg  5 mg Oral Daily Zierle-Ghosh, Asia B, DO   5 mg at 01/12/22 0858   cefTRIAXone (ROCEPHIN) 1 g in sodium chloride 0.9 % 100 mL IVPB  1 g Intravenous Q24H Zierle-Ghosh, Asia B, DO 200 mL/hr at 01/11/22 2230 1 g at 01/11/22 2230   heparin injection 5,000 Units  5,000 Units Subcutaneous Q8H Zierle-Ghosh, Asia B, DO   5,000 Units at 01/12/22 0519   losartan (COZAAR) tablet 100 mg  100 mg Oral Daily Zierle-Ghosh, Asia B, DO   100 mg at 01/12/22 0858   morphine (PF) 2 MG/ML injection 2 mg  2 mg Intravenous Q2H PRN Zierle-Ghosh, Asia B, DO   2 mg at 01/12/22 0519   ondansetron (ZOFRAN) tablet 4 mg  4 mg Oral Q6H PRN Zierle-Ghosh, Asia B, DO       Or   ondansetron (ZOFRAN) injection 4 mg  4 mg Intravenous Q6H PRN Zierle-Ghosh, Asia B, DO       oxyCODONE (Oxy IR/ROXICODONE) immediate release tablet 5 mg  5 mg Oral Q4H PRN Zierle-Ghosh, Asia B, DO   5 mg at 01/12/22 0858    Allergies as of 01/11/2022   (No Known Allergies)    Family History  Problem Relation Age of Onset   Congestive Heart Failure Maternal Grandmother    Hypertension Maternal Grandmother    Diabetes Maternal Grandmother    Other Maternal Grandfather        aneursym   Kidney disease  Maternal Grandfather        on dialysis   Congestive Heart Failure Maternal Grandfather    Diabetes Maternal Grandfather     Social History   Socioeconomic History   Marital status: Single    Spouse name: Not on file   Number of children: Not on file   Years of education: Not on file   Highest education level: Not on file  Occupational History   Not on file  Tobacco Use   Smoking status: Every Day    Packs/day: 0.25    Years: 17.00    Total pack years: 4.25    Types: Cigarettes   Smokeless tobacco: Never   Tobacco comments:    smokes 2-3 cig daily  Vaping Use   Vaping Use: Never used  Substance and Sexual Activity   Alcohol use:  Yes    Comment: socially   Drug use: No   Sexual activity: Yes    Birth control/protection: None  Other Topics Concern   Not on file  Social History Narrative   Not on file   Social Determinants of Health   Financial Resource Strain: Medium Risk (02/16/2021)   Overall Financial Resource Strain (CARDIA)    Difficulty of Paying Living Expenses: Somewhat hard  Food Insecurity: No Food Insecurity (01/11/2022)   Hunger Vital Sign    Worried About Running Out of Food in the Last Year: Never true    Ran Out of Food in the Last Year: Never true  Transportation Needs: No Transportation Needs (01/11/2022)   PRAPARE - Hydrologist (Medical): No    Lack of Transportation (Non-Medical): No  Physical Activity: Inactive (02/16/2021)   Exercise Vital Sign    Days of Exercise per Week: 0 days    Minutes of Exercise per Session: 0 min  Stress: No Stress Concern Present (02/16/2021)   [place]    Feeling of Stress : Only a little  Social Connections: Moderately Isolated (02/16/2021)   Social Connection and Isolation Panel [NHANES]    Frequency of Communication with Friends and Family: Three times a week    Frequency of Social Gatherings with Friends and Family:  Twice a week    Attends Religious Services: 1 to 4 times per year    Active Member of [employer] or Organizations: No    Attends Archivist Meetings: Never    Marital Status: Divorced  Human resources officer Violence: Not At Risk (01/11/2022)   Humiliation, Afraid, Rape, and Kick questionnaire    Fear of Current or Ex-Partner: No    Emotionally Abused: No    Physically Abused: No    Sexually Abused: No   Review of Systems: Gen: Denies fever, chills, loss of appetite, change in weight or weight loss CV: Denies chest pain, heart palpitations, syncope, edema  Resp: Denies shortness of breath with rest, cough, wheezing GI: +upper abdominal pain GU : Denies urinary burning, urinary frequency, urinary incontinence.  MS: Denies joint pain,swelling, cramping Derm: Denies rash, itching, dry skin Psych: Denies depression, anxiety,confusion, or memory loss Heme: Denies bruising, bleeding, and enlarged lymph nodes.  Physical Exam: Vital signs in last 24 hours: Temp:  [97.9 F (36.6 C)-98.8 F (37.1 C)] 98 F (36.7 C) (10/04 0616) Pulse Rate:  [68-97] 73 (10/04 0616) Resp:  [14-23] 19 (10/04 0616) BP: (114-169)/(64-106) 114/64 (10/04 0616) SpO2:  [96 %-100 %] 99 % (10/04 0616) Weight:  [117.9 kg] 117.9 kg (10/03 1530) Last BM Date : 01/11/22 General:   Alert,  Well-developed, well-nourished, pleasant and cooperative in NAD Head:  Normocephalic and atraumatic. Eyes:  Sclera clear, no icterus.   Conjunctiva pink. Ears:  Normal auditory acuity. Nose:  No deformity, discharge,  or lesions. Mouth:  No deformity or lesions, dentition normal. Neck:  Supple; no masses or thyromegaly. Lungs:  Clear throughout to auscultation.   No wheezes, crackles, or rhonchi. No acute distress. Heart:  Regular rate and rhythm; no murmurs, clicks, rubs,  or gallops. Abdomen:  Soft, and nondistended. TTP of epigastric and LUQ. No masses, hepatosplenomegaly or hernias noted. Normal bowel sounds, without guarding,  and without rebound.   Rectal:  Deferred until time of colonoscopy.   Msk:  Symmetrical without gross deformities. Normal posture. Pulses:  Normal pulses noted. Extremities:  Without clubbing or edema. Neurologic:  Alert and  oriented x4;  grossly normal neurologically. Skin:  Intact without significant lesions or rashes. Psych:  Alert and cooperative. Normal mood and affect.  Intake/Output from previous day: 10/03 0701 - 10/04 0700 In: 633.3 [I.V.:533.3; IV Piggyback:100] Out: -  Intake/Output this shift: No intake/output data recorded.  Lab Results: Recent Labs    01/11/22 1552 01/12/22 0417  WBC 6.3 6.6  HGB 13.7 12.1  HCT 42.2 37.5  PLT 328 288   BMET Recent Labs    01/11/22 1552 01/12/22 0417  NA 138 138  K 4.0 3.7  CL 107 109  CO2 25 20*  GLUCOSE 130* 110*  BUN 9 7  CREATININE 0.94 0.88  CALCIUM 11.8* 11.1*   LFT Recent Labs    01/11/22 1552 01/12/22 0417  PROT 7.3 6.4*  ALBUMIN 3.7 3.2*  AST 18 446*  ALT 21 182*  ALKPHOS 88 117  BILITOT 0.7 0.8    Studies/Results: CT ABDOMEN PELVIS W CONTRAST  Result Date: 01/11/2022 CLINICAL DATA:  Left-sided abdominal pain with diarrhea x1 week EXAM: CT ABDOMEN AND PELVIS WITH CONTRAST TECHNIQUE: Multidetector CT imaging of the abdomen and pelvis was performed using the standard protocol following bolus administration of intravenous contrast. RADIATION DOSE REDUCTION: This exam was performed according to the departmental dose-optimization program which includes automated exposure control, adjustment of the mA and/or kV according to patient size and/or use of iterative reconstruction technique. CONTRAST:  113mL OMNIPAQUE IOHEXOL 300 MG/ML  SOLN COMPARISON:  Right upper quadrant ultrasound February 28, 2020 FINDINGS: Lower chest: No acute abnormality. Hepatobiliary: No suspicious hepatic lesion. Gallbladder surgically absent. No biliary ductal dilation. Pancreas: Inflammation of the pancreatic body and tail with  peripancreatic fluid and ductal dilation to the level of a 10 mm calcification in the pancreatic neck on image 21/2. No walled off fluid collections. No evidence of necrosis. Spleen: No splenomegaly. Adrenals/Urinary Tract: Left adrenal nodule measures 2 cm with Hounsfield units of 82. Right adrenal gland appears normal. Nonobstructive right renal calculi measure up to 3 mm. No obstructive ureteral or bladder calculi identified. Urinary bladder is unremarkable for degree of distension. Stomach/Bowel: No radiopaque enteric contrast material was administered. Stomach is unremarkable for degree of distension. No pathologic dilation of small or large bowel. The appendix and terminal ileum appear normal. No evidence of acute bowel inflammation. Vascular/Lymphatic: Normal caliber abdominal aorta. No pathologically enlarged abdominal or pelvic lymph nodes. Reproductive: Gas in the vagina. Uterus and bilateral adnexa are unremarkable. Other: No walled off fluid collections. No pneumoperitoneum. No portal venous gas. Musculoskeletal: No acute osseous abnormality. IMPRESSION: 1. Acute interstitial pancreatitis involving the body and tail of the pancreas. No walled off fluid collections. 2. Calcification in the pancreatic neck which appears to be located within the pancreatic duct with upstream dilation of the duct and corresponding pancreatic atrophy in the body and tail. Constellation of findings which are most consistent with sequela of chronic pancreatitis and ductal obstruction. Suggest GI consult and further evaluation by nonemergent MRCP with and without contrast preferably as an outpatient upon resolution of patient's current symptomatology when they are better able to follow commands including breath hold. 3. Nonobstructive right renal calculi measure up to 3 mm. 4. Indeterminate 2 cm left adrenal nodule, probably reflecting a benign adenoma. Recommend follow-up adrenal washout CT in 1 year. If stable for = 1 year, no  further follow-up imaging. JACR 2017 Aug; 14(8):1038-44, JCAT 2016 Mar-Apr; 40(2):194-200, Urol J 2006 Spring; 3(2):71-4. Electronically Signed   By: Dahlia Bailiff  M.D.   On: 01/11/2022 18:58    Impression: Renee [name] is a 41 y.o. year old female with history of hyperparathyroidism, HTN, HSV who presented to the ED yesterday with left sided abdominal pain for the past week with noted stools ranging from diarrhea to constipation, also orange in color. Labs unremarkable, however CT concerning for acute pancreatitis and stones in PD, GI consulted for further evaluation.  Acute Pancreatitis with Pancreatic duct stones:lipase WNL, CT concerning for acute pancreatitis and calcification within PD and upstream dilation of the duct and corresponding pancreatic atrophy in the body and tail, consistent with chronic pancreatitis and obstruction. MRCP consistent with acute on chronic pancreatitis with ductal obstruction in the central pancreas in the pancreatic body, resultant near complete atrophy of peripheral pancreas.  No acute pancreatic fluid collection. No signs of splenic vein or portal vein abnormality. Known intraductal calculus not seen currently. Occult neoplasm leading to obstruction cannot be excluded.  Patient denies frequent ETOH use or hx of known pancreatitis, recently stopped smoking. Lipid Panel is WNL. No new medications or herbal supplements. LFTs initially normal, now with AST 446, ALT 182, AP and T bili remain normal. patient will need outpatient ERCP and/or EUS for further evaluation of the above MRCP findings. Our team will reach out to discuss this. Will start soft diet as she is hungry, continue to trend LFTs  daily while inpatient.   Plan: Continue with supportive measures LFTs daily Pain management per hospitalist Outpatient ERCP and/or EUS for further evaluation, likely with [facility] Soft diet   LOS: 0 days    01/12/2022, 9:32 AM   Nicholl Onstott L. Alver Sorrow, MSN, APRN,  AGNP-C Adult-Gerontology Nurse Practitioner [facility]

## 2022-01-12 NOTE — H&P (Signed)
History and Physical    Patient: Renee [name] [id number] DOB: 1981/01/21 DOA: 01/11/2022 DOS: the patient was seen and examined on 01/12/2022 PCP: Pcp, No  Patient coming from: Home  Chief Complaint:  Chief Complaint  Patient presents with   Abdominal Pain   HPI: Renee [name] is a 41 y.o. female with medical history significant of hypertension, hypothyroidism, HSV, and more presents to the ED with a chief complaint of abdominal pain.  Patient reports symptoms started approximately 1 week ago.  Her pain is epigastric and it wraps around to her her upper back.  She says it is sharp and intermittent.  When she has a bowel movement it is worse and becomes crampy as well.  The only relieving factor has been fentanyl.  She reports nausea but no vomiting.  She been having alternating diarrhea and constipation.  On days that she has diarrhea that happens 4-5 times.  She has not had any hematochezia or melena.  She reports that her stool is yellow in color.  Patient denies any fever.  Her last normal meal was yesterday.  Her last normal bowel movement was yesterday.  Patient has history of cholecystectomy about 1-1/2 years ago.  She reports that this pain that she is feeling now is similar to the pain that she was having then.  She seldom drinks alcohol.  She recently came back from vacation with her family, and since she was with her family she was not drinking at all.  She had no trauma to her abdomen.  She has not been traveling out [place], she was in [place].  She is not on any new medications.  She has never had pancreatitis before. - Patient is feeling better after 1 dose of 50 mcg of fentanyl - Patient does not smoke, does not drink often, is full code. Review of Systems: As mentioned in the history of present illness. All other systems reviewed and are negative. Past Medical History:  Diagnosis Date   Elevated serum creatinine 02/19/2021   Herpes simplex virus (HSV) infection    no OB +HSV  05/04/16 at [facility]    Hypertension    Hypothyroidism    Vaginal Pap smear, abnormal    Past Surgical History:  Procedure Laterality Date   CHOLECYSTECTOMY N/A 04/13/2020   Procedure: LAPAROSCOPIC CHOLECYSTECTOMY WITH INTRAOPERATIVE CHOLANGIOGRAM;  Surgeon: Armandina Gemma, MD;  Location: [facility];  Service: General;  Laterality: N/A;   KNEE SURGERY     TONSILLECTOMY     Social History:  reports that she has been smoking cigarettes. She has a 4.25 pack-year smoking history. She has never used smokeless tobacco. She reports current alcohol use. She reports that she does not use drugs.  No Known Allergies  Family History  Problem Relation Age of Onset   Congestive Heart Failure Maternal Grandmother    Hypertension Maternal Grandmother    Diabetes Maternal Grandmother    Other Maternal Grandfather        aneursym   Kidney disease Maternal Grandfather        on dialysis   Congestive Heart Failure Maternal Grandfather    Diabetes Maternal Grandfather     Prior to Admission medications   Medication Sig Start Date End Date Taking? Authorizing Provider  amLODipine (NORVASC) 5 MG tablet Take 5 mg by mouth daily. 01/10/20  Yes [provider]  Cholecalciferol (DIALYVITE VITAMIN D 5000) 125 MCG (5000 UT) capsule Take 5,000 Units by mouth daily.   Yes [provider]  losartan (COZAAR) 100 MG tablet Take 100 mg by mouth daily.   Yes [provider]  naproxen (NAPROSYN) 375 MG tablet Take 1 tablet (375 mg total) by mouth 2 (two) times daily with a meal. 03/11/21  Yes Sanjuana Kava, MD  ciprofloxacin (CIPRO) 500 MG tablet Take 1 tablet (500 mg total) by mouth every 12 (twelve) hours. 01/07/22   Lamptey, Myrene Galas, MD  dicyclomine (BENTYL) 20 MG tablet Take 1 tablet (20 mg total) by mouth 2 (two) times daily as needed for spasms. 01/07/22   Lamptey, Myrene Galas, MD  metroNIDAZOLE (FLAGYL) 500 MG tablet Take 1 tablet (500 mg total) by mouth 2 (two) times daily for 5 days. 01/07/22  01/12/22  Chase Picket, MD    Physical Exam: Vitals:   01/11/22 1953 01/11/22 2133 01/11/22 2149 01/12/22 0202  BP: (!) 156/97 (!) 169/97 (!) 146/69 129/76  Pulse: 78 68 72 87  Resp: (!) 21 (!) 23 19 14   Temp: 98.8 F (37.1 C) 98.8 F (37.1 C) 98.3 F (36.8 C) 97.9 F (36.6 C)  TempSrc: Oral Oral    SpO2: 100% 100% 100% 96%  Weight:      Height:       1.  General: Patient lying supine in bed,  no acute distress   2. Psychiatric: Alert and oriented x 3, mood and behavior normal for situation, pleasant and cooperative with exam   3. Neurologic: Speech and language are normal, face is symmetric, moves all 4 extremities voluntarily, at baseline without acute deficits on limited exam   4. HEENMT:  Head is atraumatic, normocephalic, pupils reactive to light, neck is supple, trachea is midline, mucous membranes are moist   5. Respiratory : Lungs are clear to auscultation bilaterally without wheezing, rhonchi, rales, no cyanosis, no increase in work of breathing or accessory muscle use   6. Cardiovascular : Heart rate normal, rhythm is regular, no murmurs, rubs or gallops, no peripheral edema, peripheral pulses palpated   7. Gastrointestinal:  Abdomen is soft, nondistended, mildly tender in the left upper quadrant but exquisitely tender in the epigastric region, bowel sounds active, no masses or organomegaly palpated   8. Skin:  Skin is warm, dry and intact without rashes, acute lesions, or ulcers on limited exam   9.Musculoskeletal:  No acute deformities or trauma, no asymmetry in tone, no peripheral edema, peripheral pulses palpated, no tenderness to palpation in the extremities  Data Reviewed: In the ED Temp 98.7, heart rate 78-97, respiratory rate 20-21, blood pressure 156/97-164/106, satting at 100% No leukocytosis with a white blood cell count of 6.3, hemoglobin 13.7, platelets 328 Chemistries unremarkable UA is indicative of UTI CT abdomen pelvis shows  interstitial pancreatitis with dilation of the duct and some calcification Lipase 35 GI was consulted from the ED and plans to see patient in the a.m. Continue to monitor  Assessment and Plan: * Acute pancreatitis - Acute pancreatitis - CT scan reads dilation of the duct and calcification present as well - Check lipid panel - Check MRCP - GI consulted - N.p.o., IV fluids - Pain control - Continue to monitor  UTI (urinary tract infection) - UA suspicious for UTI - Urine culture pending - Rocephin started, continue - No leukocytosis - Continue to monitor -  Essential hypertension - Continue Norvasc and losartan  Hypercalcemia - Secondary to hyperparathyroidism for which patient is taking no medications - Check PTH, ionized calcium, phosphorus - Patient was given 1 L bolus in the ED, continue IV  fluids throughout the night, trend in the a.m.  Hyperparathyroidism, primary ([place]) - Check PTH, ionized calcium, phosphorus      Advance Care Planning:   Code Status: Full Code   Consults: GI  Family Communication: Emily at bedside  Severity of Illness: The appropriate patient status for this patient is OBSERVATION. Observation status is judged to be reasonable and necessary in order to provide the required intensity of service to ensure the patient's safety. The patient's presenting symptoms, physical exam findings, and initial radiographic and laboratory data in the context of their medical condition is felt to place them at decreased risk for further clinical deterioration. Furthermore, it is anticipated that the patient will be medically stable for discharge from the hospital within 2 midnights of admission.   Author: Rolla Plate, DO 01/12/2022 3:08 AM  For on call review [email or web address].

## 2022-01-12 NOTE — Assessment & Plan Note (Addendum)
-   Secondary to hyperparathyroidism for which patient is taking no medications - Check PTH, ionized calcium, phosphorus - Patient was given 1 L bolus in the ED, continue IV fluids throughout the night, trend in the a.m.

## 2022-01-12 NOTE — Progress Notes (Signed)
Renee [name] is a 41 y.o. female with medical history significant of hypertension, hypothyroidism, HSV, and more presents to the ED with a chief complaint of abdominal pain.  Patient reports symptoms started approximately 1 week ago.  She has been admitted with acute pancreatitis and MRCP has been ordered and is pending.  GI consulted and is following with diet advanced to soft as pain has improved.  She also has urine analysis concerning for UTI and has been started on Rocephin empirically with urine culture pending.  She also has concern for some mild hypercalcemia in the setting of primary hyperparathyroidism.  Further test results are currently pending.  Patient has been admitted after midnight and has been seen and evaluated at bedside.  Total care time: 30 minutes.

## 2022-01-12 NOTE — Assessment & Plan Note (Signed)
-   Check PTH, ionized calcium, phosphorus

## 2022-01-12 NOTE — Assessment & Plan Note (Signed)
-   UA suspicious for UTI - Urine culture pending - Rocephin started, continue - No leukocytosis - Continue to monitor -

## 2022-01-13 DIAGNOSIS — R7989 Other specified abnormal findings of blood chemistry: Secondary | ICD-10-CM

## 2022-01-13 LAB — COMPREHENSIVE METABOLIC PANEL
ALT: 187 U/L — ABNORMAL HIGH (ref 0–44)
AST: 123 U/L — ABNORMAL HIGH (ref 15–41)
Albumin: 3.3 g/dL — ABNORMAL LOW (ref 3.5–5.0)
Alkaline Phosphatase: 115 U/L (ref 38–126)
Anion gap: 5 (ref 5–15)
BUN: 11 mg/dL (ref 6–20)
CO2: 22 mmol/L (ref 22–32)
Calcium: 11.4 mg/dL — ABNORMAL HIGH (ref 8.9–10.3)
Chloride: 110 mmol/L (ref 98–111)
Creatinine, Ser: 0.94 mg/dL (ref 0.44–1.00)
GFR, Estimated: >60 mL/min (ref >60)
Glucose, Bld: 108 mg/dL — ABNORMAL HIGH (ref 70–99)
Potassium: 4 mmol/L (ref 3.5–5.1)
Sodium: 137 mmol/L (ref 135–145)
Total Bilirubin: 0.6 mg/dL (ref 0.3–1.2)
Total Protein: 6.4 g/dL — ABNORMAL LOW (ref 6.5–8.1)

## 2022-01-13 LAB — CBC
HCT: 36.9 % (ref 36.0–46.0)
Hemoglobin: 11.9 g/dL — ABNORMAL LOW (ref 12.0–15.0)
MCH: 28.5 pg (ref 26.0–34.0)
MCHC: 32.2 g/dL (ref 30.0–36.0)
MCV: 88.5 fL (ref 80.0–100.0)
Platelets: 263 10*3/uL (ref 150–400)
RBC: 4.17 MIL/uL (ref 3.87–5.11)
RDW: 12.7 % (ref 11.5–15.5)
WBC: 4.9 10*3/uL (ref 4.0–10.5)
nRBC: 0 % (ref 0.0–0.2)

## 2022-01-13 LAB — URINE CULTURE

## 2022-01-13 LAB — MAGNESIUM: Magnesium: 2 mg/dL (ref 1.7–2.4)

## 2022-01-13 MED ORDER — KETOROLAC TROMETHAMINE 30 MG/ML IJ SOLN
30.0000 mg | Freq: Once | INTRAMUSCULAR | Status: AC
  Administered 2022-01-13: 30 mg via INTRAVENOUS
  Filled 2022-01-13: qty 1

## 2022-01-13 NOTE — Progress Notes (Signed)
PROGRESS NOTE    Renee LevinsRenate K Hensley  ZOX:[id number] DOB: [birth year] DOA: 01/11/2022 PCP: Pcp, No   Brief Narrative:    Renee [name] is a 41 y.o. female with medical history significant of hypertension, hypothyroidism, HSV, and more presents to the ED with a chief complaint of abdominal pain.  Patient reports symptoms started approximately 1 week ago.  She has been admitted with acute pancreatitis.  MRCP showed findings consistent with acute on chronic pancreatitis with ductal obstruction.  GI following with plans for outpatient EUS/ERCP.  Assessment & Plan:   Principal Problem:   Acute pancreatitis Active Problems:   Hyperparathyroidism, primary (HCC)   Hypercalcemia   Essential hypertension   UTI (urinary tract infection)   Pancreatic duct dilated  Assessment and Plan:   Acute pancreatitis - Acute pancreatitis - MRCP findings with acute on chronic pancreatitis and ductal obstruction, GI planning for outpatient EUS/ERCP -Noted to have some nausea and vomiting overnight, monitor on soft diet -Hold further IV fluid -LFTs downtrending, monitor in a.m.   UTI (urinary tract infection) - UA suspicious for UTI - Urine culture with multiple species, complete 3-day course - Rocephin started, continue - No leukocytosis - Continue to monitor -   Essential hypertension - Continue Norvasc and losartan   Hypercalcemia-stable - Secondary to hyperparathyroidism for which patient is taking no medications - Check PTH, ionized calcium, phosphorus - Patient was given 1 L bolus in the ED, continue IV fluids throughout the night, trend in the a.m.   Hyperparathyroidism, primary (HCC) - Plan for eventual parathyroidectomy which she is following up for  Morbid obesity -BMI 49.13    DVT prophylaxis: Heparin Code Status: Full Family Communication: None at bedside Disposition Plan:  Status is: Inpatient Remains inpatient appropriate because: IV medications and close  monitoring  Consultants:  GI  Procedures:  See below  Antimicrobials:  None   Subjective: Patient seen and evaluated today with headache as well as 2 episodes of emesis overnight with some nausea.  She denies any significant abdominal pain.  Objective: Vitals:   01/12/22 0616 01/12/22 1413 01/12/22 2111 01/13/22 0331  BP: 114/64 119/73 111/78 120/70  Pulse: 73 74 69 71  Resp: 19 17 19 15   Temp: 98 F (36.7 C) 98.6 F (37 C) 98.3 F (36.8 C) (!) 97.2 F (36.2 C)  TempSrc:  Oral    SpO2: 99% 98% 100% 99%  Weight:      Height:        Intake/Output Summary (Last 24 hours) at 01/13/2022 1256 Last data filed at 01/13/2022 0500 Gross per 24 hour  Intake 1040 ml  Output --  Net 1040 ml   Filed Weights   01/11/22 1530  Weight: 117.9 kg    Examination:  General exam: Appears calm and comfortable, obese Respiratory system: Clear to auscultation. Respiratory effort normal. Cardiovascular system: S1 & S2 heard, RRR.  Gastrointestinal system: Abdomen is soft Central nervous system: Alert and awake Extremities: No edema Skin: No significant lesions noted Psychiatry: Flat affect.    Data Reviewed: I have personally reviewed following labs and imaging studies  CBC: Recent Labs  Lab 01/11/22 1552 01/12/22 0417 01/13/22 0437  WBC 6.3 6.6 4.9  NEUTROABS 4.3 4.9  --   HGB 13.7 12.1 11.9*  HCT 42.2 37.5 36.9  MCV 87.9 88.4 88.5  PLT 328 288 263   Basic Metabolic Panel: Recent Labs  Lab 01/11/22 1552 01/12/22 0417 01/13/22 0437  NA 138 138 137  K 4.0 3.7 4.0  CL 107 109 110  CO2 25 20* 22  GLUCOSE 130* 110* 108*  BUN 9 7 11   CREATININE 0.94 0.88 0.94  CALCIUM 11.8* 11.1* 11.4*  MG  --  2.2 2.0  PHOS  --  2.0*  --    GFR: Estimated Creatinine Clearance: 94.2 mL/min (by C-G formula based on SCr of 0.94 mg/dL). Liver Function Tests: Recent Labs  Lab 01/11/22 1552 01/12/22 0417 01/13/22 0437  AST 18 446* 123*  ALT 21 182* 187*  ALKPHOS 88 117 115   BILITOT 0.7 0.8 0.6  PROT 7.3 6.4* 6.4*  ALBUMIN 3.7 3.2* 3.3*   Recent Labs  Lab 01/11/22 1552  LIPASE 35   No results for input(s): "AMMONIA" in the last 168 hours. Coagulation Profile: No results for input(s): "INR", "PROTIME" in the last 168 hours. Cardiac Enzymes: No results for input(s): "CKTOTAL", "CKMB", "CKMBINDEX", "TROPONINI" in the last 168 hours. BNP (last 3 results) No results for input(s): "PROBNP" in the last 8760 hours. HbA1C: No results for input(s): "HGBA1C" in the last 72 hours. CBG: No results for input(s): "GLUCAP" in the last 168 hours. Lipid Profile: Recent Labs    01/12/22 0417  CHOL 128  HDL 48  LDLCALC 67  TRIG 66  CHOLHDL 2.7   Thyroid Function Tests: No results for input(s): "TSH", "T4TOTAL", "FREET4", "T3FREE", "THYROIDAB" in the last 72 hours. Anemia Panel: No results for input(s): "VITAMINB12", "FOLATE", "FERRITIN", "TIBC", "IRON", "RETICCTPCT" in the last 72 hours. Sepsis Labs: No results for input(s): "PROCALCITON", "LATICACIDVEN" in the last 168 hours.  Recent Results (from the past 240 hour(s))  Urine Culture     Status: Abnormal   Collection Time: 01/11/22  3:33 PM   Specimen: Urine, Clean Catch  Result Value Ref Range Status   Specimen Description   Final    URINE, CLEAN CATCH Performed at [facility], [address] [place], [place]    Special Requests   Final    NONE Performed at [facility], [address], [place], [place]    Culture MULTIPLE SPECIES PRESENT, SUGGEST RECOLLECTION (A)  Final   Report Status 01/13/2022 FINAL  Final  Gastrointestinal Panel by PCR , Stool     Status: Abnormal   Collection Time: 01/11/22  9:35 PM  Result Value Ref Range Status   Campylobacter species DETECTED (A) NOT DETECTED Final    Comment: RESULT CALLED TO, READ BACK BY AND VERIFIED WITH: 03/13/22 RN 1148 01/12/22 HNM    Plesimonas shigelloides NOT DETECTED NOT DETECTED Final   Salmonella species NOT  DETECTED NOT DETECTED Final   Yersinia enterocolitica NOT DETECTED NOT DETECTED Final   Vibrio species NOT DETECTED NOT DETECTED Final   Vibrio cholerae NOT DETECTED NOT DETECTED Final   Enteroaggregative E coli (EAEC) NOT DETECTED NOT DETECTED Final   Enteropathogenic E coli (EPEC) NOT DETECTED NOT DETECTED Final   Enterotoxigenic E coli (ETEC) NOT DETECTED NOT DETECTED Final   Shiga like toxin producing E coli (STEC) NOT DETECTED NOT DETECTED Final   Shigella/Enteroinvasive E coli (EIEC) NOT DETECTED NOT DETECTED Final   Cryptosporidium NOT DETECTED NOT DETECTED Final   Cyclospora cayetanensis NOT DETECTED NOT DETECTED Final   Entamoeba histolytica NOT DETECTED NOT DETECTED Final   Giardia lamblia NOT DETECTED NOT DETECTED Final   Adenovirus F40/41 NOT DETECTED NOT DETECTED Final   Astrovirus NOT DETECTED NOT DETECTED Final   Norovirus GI/GII NOT DETECTED NOT DETECTED Final   Rotavirus A NOT DETECTED NOT DETECTED Final  Sapovirus (I, II, IV, and V) NOT DETECTED NOT DETECTED Final    Comment: Performed at [facility], [address], [place], [place] [address]         Radiology Studies: MR ABDOMEN MRCP W WO CONTAST  Result Date: 01/12/2022 CLINICAL DATA:  Acute pancreatitis. EXAM: MRI ABDOMEN WITHOUT AND WITH CONTRAST (INCLUDING MRCP) TECHNIQUE: Multiplanar multisequence MR imaging of the abdomen was performed both before and after the administration of intravenous contrast. Heavily T2-weighted images of the biliary and pancreatic ducts were obtained, and three-dimensional MRCP images were rendered by post processing. CONTRAST:  44mL GADAVIST GADOBUTROL 1 MMOL/ML IV SOLN COMPARISON:  January 11, 2022. FINDINGS: Lower chest: Lung bases are clear to the extent evaluated on this abdominal MRI. Hepatobiliary: Mild hepatic steatosis. No visible lesion in the liver, study is motion degraded. No filling defect in the biliary tree with normal post cholecystectomy appearance of the  biliary tree. Pancreas: Signs of acute interstitial edematous pancreatitis about the tail of the pancreas which is nearly completely atrophied around a dilated pancreatic duct with ectatic side branches. Main pancreatic duct approximately 5-6 mm in the tail. Little if any remaining parenchyma about the body peripherally in the tail of the pancreas. No acute pancreatic fluid collections. The known intraductal stone in the proximal pancreatic body is not visible on the current study. Remaining parenchyma in the pancreatic head with normal appearance though motion degrades assessment. Portal vein and splenic vein are patent. Spleen:  Normal. Adrenals/Urinary Tract: RIGHT adrenal gland is normal. LEFT adrenal gland with loss of signal on out of phase as compared to in phase T1 weighted gradient echo imaging greater than 50% loss of signal on the current exam. This adrenal adenoma measures approximately 2.2 x 2.1 cm. Renal contours are smooth without perinephric stranding. No hydronephrosis. No suspicious renal lesion. Stomach/Bowel: No acute gastrointestinal findings to the extent evaluated on this abdominal MRI. Vascular/Lymphatic: No pathologically enlarged lymph nodes identified. No abdominal aortic aneurysm demonstrated. Other:  No ascites. Musculoskeletal: No suspicious bone lesions identified. IMPRESSION: 1. Findings of acute on chronic pancreatitis with ductal obstruction in the central pancreas in the pancreatic body, resultant near complete atrophy of peripheral pancreas as seen on recent CT imaging. No acute pancreatic fluid collection. No signs of splenic vein or portal vein abnormality. Known intraductal calculus not seen currently. Preservation of T1 signal 2. Constellation of findings may be due to previous pancreatitis with ductal stricture and subsequent stone formation. Alternatively, occult neoplasm leading to ductal obstruction and these findings cannot be excluded on the basis of this exam. Follow-up  endoscopic assessment may be helpful to exclude small occult neoplasm. 3. Mild hepatic steatosis. 4. 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 Compatible with a benign finding for which no additional dedicated follow-up imaging is suggested. Based on current clinical literature, biochemical evaluation to exclude possible functioning adrenal nodule is suggested if not already performed. Please refer to current clinical guidelines for detailed recommendations. NEJM 9563:875 [id number] Electronically Signed   By: Zetta Bills M.D.   On: 01/12/2022 10:45   MR 3D Recon At Scanner  Result Date: 01/12/2022 CLINICAL DATA:  Acute pancreatitis. EXAM: MRI ABDOMEN WITHOUT AND WITH CONTRAST (INCLUDING MRCP) TECHNIQUE: Multiplanar multisequence MR imaging of the abdomen was performed both before and after the administration of intravenous contrast. Heavily T2-weighted images of the biliary and pancreatic ducts were obtained, and three-dimensional MRCP images were rendered by post processing. CONTRAST:  18mL GADAVIST GADOBUTROL 1 MMOL/ML IV SOLN COMPARISON:  January 11, 2022. FINDINGS: Lower chest: Lung bases are clear to the extent evaluated on this abdominal MRI. Hepatobiliary: Mild hepatic steatosis. No visible lesion in the liver, study is motion degraded. No filling defect in the biliary tree with normal post cholecystectomy appearance of the biliary tree. Pancreas: Signs of acute interstitial edematous pancreatitis about the tail of the pancreas which is nearly completely atrophied around a dilated pancreatic duct with ectatic side branches. Main pancreatic duct approximately 5-6 mm in the tail. Little if any remaining parenchyma about the body peripherally in the tail of the pancreas. No acute pancreatic fluid collections. The known intraductal stone in the proximal pancreatic body is not visible on the current study. Remaining parenchyma in the pancreatic head with normal appearance though motion degrades assessment. Portal vein and  splenic vein are patent. Spleen:  Normal. Adrenals/Urinary Tract: RIGHT adrenal gland is normal. LEFT adrenal gland with loss of signal on out of phase as compared to in phase T1 weighted gradient echo imaging greater than 50% loss of signal on the current exam. This adrenal adenoma measures approximately 2.2 x 2.1 cm. Renal contours are smooth without perinephric stranding. No hydronephrosis. No suspicious renal lesion. Stomach/Bowel: No acute gastrointestinal findings to the extent evaluated on this abdominal MRI. Vascular/Lymphatic: No pathologically enlarged lymph nodes identified. No abdominal aortic aneurysm demonstrated. Other:  No ascites. Musculoskeletal: No suspicious bone lesions identified. IMPRESSION: 1. Findings of acute on chronic pancreatitis with ductal obstruction in the central pancreas in the pancreatic body, resultant near complete atrophy of peripheral pancreas as seen on recent CT imaging. No acute pancreatic fluid collection. No signs of splenic vein or portal vein abnormality. Known intraductal calculus not seen currently. Preservation of T1 signal 2. Constellation of findings may be due to previous pancreatitis with ductal stricture and subsequent stone formation. Alternatively, occult neoplasm leading to ductal obstruction and these findings cannot be excluded on the basis of this exam. Follow-up endoscopic assessment may be helpful to exclude small occult neoplasm. 3. Mild hepatic steatosis. 4. 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 Compatible with a benign finding for which no additional dedicated follow-up imaging is suggested. Based on current clinical literature, biochemical evaluation to exclude possible functioning adrenal nodule is suggested if not already performed. Please refer to current clinical guidelines for detailed recommendations. NEJM [id number] [id number] Electronically Signed   By: Donzetta Kohut M.D.   On: 01/12/2022 10:45   CT ABDOMEN PELVIS W CONTRAST  Result Date:  01/11/2022 CLINICAL DATA:  Left-sided abdominal pain with diarrhea x1 week EXAM: CT ABDOMEN AND PELVIS WITH CONTRAST TECHNIQUE: Multidetector CT imaging of the abdomen and pelvis was performed using the standard protocol following bolus administration of intravenous contrast. RADIATION DOSE REDUCTION: This exam was performed according to the departmental dose-optimization program which includes automated exposure control, adjustment of the mA and/or kV according to patient size and/or use of iterative reconstruction technique. CONTRAST:  OMNIPAQUE IOHEXOL 300 MG/ML  SOLN COMPARISON:  Right upper quadrant ultrasound February 28, 2020 FINDINGS: Lower chest: No acute abnormality. Hepatobiliary: No suspicious hepatic lesion. Gallbladder surgically absent. No biliary ductal dilation. Pancreas: Inflammation of the pancreatic body and tail with peripancreatic fluid and ductal dilation to the level of a 10 mm calcification in the pancreatic neck on image 21/2. No walled off fluid collections. No evidence of necrosis. Spleen: No splenomegaly. Adrenals/Urinary Tract: Left adrenal nodule measures 2 cm with Hounsfield units of 82. Right adrenal gland appears normal. Nonobstructive right renal calculi measure up to 3 mm. No obstructive ureteral  or bladder calculi identified. Urinary bladder is unremarkable for degree of distension. Stomach/Bowel: No radiopaque enteric contrast material was administered. Stomach is unremarkable for degree of distension. No pathologic dilation of small or large bowel. The appendix and terminal ileum appear normal. No evidence of acute bowel inflammation. Vascular/Lymphatic: Normal caliber abdominal aorta. No pathologically enlarged abdominal or pelvic lymph nodes. Reproductive: Gas in the vagina. Uterus and bilateral adnexa are unremarkable. Other: No walled off fluid collections. No pneumoperitoneum. No portal venous gas. Musculoskeletal: No acute osseous abnormality. IMPRESSION: 1. Acute  interstitial pancreatitis involving the body and tail of the pancreas. No walled off fluid collections. 2. Calcification in the pancreatic neck which appears to be located within the pancreatic duct with upstream dilation of the duct and corresponding pancreatic atrophy in the body and tail. Constellation of findings which are most consistent with sequela of chronic pancreatitis and ductal obstruction. Suggest GI consult and further evaluation by nonemergent MRCP with and without contrast preferably as an outpatient upon resolution of patient's current symptomatology when they are better able to follow commands including breath hold. 3. Nonobstructive right renal calculi measure up to 3 mm. 4. Indeterminate 2 cm left adrenal nodule, probably reflecting a benign adenoma. Recommend follow-up adrenal washout CT in 1 year. If stable for = 1 year, no further follow-up imaging. JACR 2017 Aug; 14(8):1038-44, JCAT 2016 Mar-Apr; 40(2):194-200, Urol J 2006 Spring; 3(2):71-4. Electronically Signed   By: Maudry Mayhew M.D.   On: 01/11/2022 18:58        Scheduled Meds:  amLODipine  5 mg Oral Daily   heparin  5,000 Units Subcutaneous Q8H   losartan  100 mg Oral Daily   Continuous Infusions:  sodium chloride 100 mL/hr at 01/12/22 1251   cefTRIAXone (ROCEPHIN)  IV 1 g (01/12/22 2140)     LOS: 1 day    Time spent: 35 minutes    Genie Mirabal Hoover Brunette, DO [facility]  If 7PM-7AM, please contact night-coverage [email or web address] 01/13/2022, 12:56 PM

## 2022-01-13 NOTE — Progress Notes (Signed)
Gastroenterology Progress Note   Referring Provider: No ref. provider found Primary Care Physician:  Pcp, No Primary Gastroenterologist:  Elon Alas. Abbey Chatters, DO   Patient ID: Renee [name]; [id number]; 12-17-1980   Subjective:    Feels better now. Overnight she has some vomiting. Didn't feel up to eating breakfast but since has tolerated lunch without pp abdominal pain or vomiting. No BM since 01/11/22.   Objective:   Vital signs in last 24 hours: Temp:  [97.2 F (36.2 C)-98.6 F (37 C)] 97.2 F (36.2 C) (10/05 0331) Pulse Rate:  [69-74] 71 (10/05 0331) Resp:  [15-19] 15 (10/05 0331) BP: (111-120)/(70-78) 120/70 (10/05 0331) SpO2:  [98 %-100 %] 99 % (10/05 0331) Last BM Date : 01/11/22 General:   Alert,  Well-developed, well-nourished, pleasant and cooperative in NAD Head:  Normocephalic and atraumatic. Eyes:  Sclera clear, no icterus.  Abdomen:  Soft,nondistended. Mild epig tenderness. Normal bowel sounds, without guarding, and without rebound.   Extremities:  Without clubbing, deformity or edema. Neurologic:  Alert and  oriented x4;  grossly normal neurologically. Skin:  Intact without significant lesions or rashes. Psych:  Alert and cooperative. Normal mood and affect.  Intake/Output from previous day: 10/04 0701 - 10/05 0700 In: 1040 [P.O.:1040] Out: -  Intake/Output this shift: No intake/output data recorded.  Lab Results: CBC Recent Labs    01/11/22 1552 01/12/22 0417 01/13/22 0437  WBC 6.3 6.6 4.9  HGB 13.7 12.1 11.9*  HCT 42.2 37.5 36.9  MCV 87.9 88.4 88.5  PLT 328 288 263   BMET Recent Labs    01/11/22 1552 01/12/22 0417 01/13/22 0437  NA 138 138 137  K 4.0 3.7 4.0  CL 107 109 110  CO2 25 20* 22  GLUCOSE 130* 110* 108*  BUN 9 7 11   CREATININE 0.94 0.88 0.94  CALCIUM 11.8* 11.1* 11.4*   LFTs Recent Labs    01/11/22 1552 01/12/22 0417 01/13/22 0437  BILITOT 0.7 0.8 0.6  ALKPHOS 88 117 115  AST 18 446* 123*  ALT 21 182* 187*  PROT  7.3 6.4* 6.4*  ALBUMIN 3.7 3.2* 3.3*   Recent Labs    01/11/22 1552  LIPASE 35   PT/INR No results for input(s): "LABPROT", "INR" in the last 72 hours.       Imaging Studies: MR ABDOMEN MRCP W WO CONTAST  Result Date: 01/12/2022 CLINICAL DATA:  Acute pancreatitis. EXAM: MRI ABDOMEN WITHOUT AND WITH CONTRAST (INCLUDING MRCP) TECHNIQUE: Multiplanar multisequence MR imaging of the abdomen was performed both before and after the administration of intravenous contrast. Heavily T2-weighted images of the biliary and pancreatic ducts were obtained, and three-dimensional MRCP images were rendered by post processing. CONTRAST:  89mL GADAVIST GADOBUTROL 1 MMOL/ML IV SOLN COMPARISON:  January 11, 2022. FINDINGS: Lower chest: Lung bases are clear to the extent evaluated on this abdominal MRI. Hepatobiliary: Mild hepatic steatosis. No visible lesion in the liver, study is motion degraded. No filling defect in the biliary tree with normal post cholecystectomy appearance of the biliary tree. Pancreas: Signs of acute interstitial edematous pancreatitis about the tail of the pancreas which is nearly completely atrophied around a dilated pancreatic duct with ectatic side branches. Main pancreatic duct approximately 5-6 mm in the tail. Little if any remaining parenchyma about the body peripherally in the tail of the pancreas. No acute pancreatic fluid collections. The known intraductal stone in the proximal pancreatic body is not visible on the current study. Remaining parenchyma in the pancreatic head with normal  appearance though motion degrades assessment. Portal vein and splenic vein are patent. Spleen:  Normal. Adrenals/Urinary Tract: RIGHT adrenal gland is normal. LEFT adrenal gland with loss of signal on out of phase as compared to in phase T1 weighted gradient echo imaging greater than 50% loss of signal on the current exam. This adrenal adenoma measures approximately 2.2 x 2.1 cm. Renal contours are smooth  without perinephric stranding. No hydronephrosis. No suspicious renal lesion. Stomach/Bowel: No acute gastrointestinal findings to the extent evaluated on this abdominal MRI. Vascular/Lymphatic: No pathologically enlarged lymph nodes identified. No abdominal aortic aneurysm demonstrated. Other:  No ascites. Musculoskeletal: No suspicious bone lesions identified. IMPRESSION: 1. Findings of acute on chronic pancreatitis with ductal obstruction in the central pancreas in the pancreatic body, resultant near complete atrophy of peripheral pancreas as seen on recent CT imaging. No acute pancreatic fluid collection. No signs of splenic vein or portal vein abnormality. Known intraductal calculus not seen currently. Preservation of T1 signal 2. Constellation of findings may be due to previous pancreatitis with ductal stricture and subsequent stone formation. Alternatively, occult neoplasm leading to ductal obstruction and these findings cannot be excluded on the basis of this exam. Follow-up endoscopic assessment may be helpful to exclude small occult neoplasm. 3. Mild hepatic steatosis. 4. 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 Compatible with a benign finding for which no additional dedicated follow-up imaging is suggested. Based on current clinical literature, biochemical evaluation to exclude possible functioning adrenal nodule is suggested if not already performed. Please refer to current clinical guidelines for detailed recommendations. NEJM 2330:076 [id number] Electronically Signed   By: Donzetta Kohut M.D.   On: 01/12/2022 10:45   MR 3D Recon At Scanner  Result Date: 01/12/2022 CLINICAL DATA:  Acute pancreatitis. EXAM: MRI ABDOMEN WITHOUT AND WITH CONTRAST (INCLUDING MRCP) TECHNIQUE: Multiplanar multisequence MR imaging of the abdomen was performed both before and after the administration of intravenous contrast. Heavily T2-weighted images of the biliary and pancreatic ducts were obtained, and three-dimensional MRCP images  were rendered by post processing. CONTRAST:  4mL GADAVIST GADOBUTROL 1 MMOL/ML IV SOLN COMPARISON:  January 11, 2022. FINDINGS: Lower chest: Lung bases are clear to the extent evaluated on this abdominal MRI. Hepatobiliary: Mild hepatic steatosis. No visible lesion in the liver, study is motion degraded. No filling defect in the biliary tree with normal post cholecystectomy appearance of the biliary tree. Pancreas: Signs of acute interstitial edematous pancreatitis about the tail of the pancreas which is nearly completely atrophied around a dilated pancreatic duct with ectatic side branches. Main pancreatic duct approximately 5-6 mm in the tail. Little if any remaining parenchyma about the body peripherally in the tail of the pancreas. No acute pancreatic fluid collections. The known intraductal stone in the proximal pancreatic body is not visible on the current study. Remaining parenchyma in the pancreatic head with normal appearance though motion degrades assessment. Portal vein and splenic vein are patent. Spleen:  Normal. Adrenals/Urinary Tract: RIGHT adrenal gland is normal. LEFT adrenal gland with loss of signal on out of phase as compared to in phase T1 weighted gradient echo imaging greater than 50% loss of signal on the current exam. This adrenal adenoma measures approximately 2.2 x 2.1 cm. Renal contours are smooth without perinephric stranding. No hydronephrosis. No suspicious renal lesion. Stomach/Bowel: No acute gastrointestinal findings to the extent evaluated on this abdominal MRI. Vascular/Lymphatic: No pathologically enlarged lymph nodes identified. No abdominal aortic aneurysm demonstrated. Other:  No ascites. Musculoskeletal: No suspicious bone lesions identified. IMPRESSION: 1. Findings  of acute on chronic pancreatitis with ductal obstruction in the central pancreas in the pancreatic body, resultant near complete atrophy of peripheral pancreas as seen on recent CT imaging. No acute pancreatic  fluid collection. No signs of splenic vein or portal vein abnormality. Known intraductal calculus not seen currently. Preservation of T1 signal 2. Constellation of findings may be due to previous pancreatitis with ductal stricture and subsequent stone formation. Alternatively, occult neoplasm leading to ductal obstruction and these findings cannot be excluded on the basis of this exam. Follow-up endoscopic assessment may be helpful to exclude small occult neoplasm. 3. Mild hepatic steatosis. 4. 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 Compatible with a benign finding for which no additional dedicated follow-up imaging is suggested. Based on current clinical literature, biochemical evaluation to exclude possible functioning adrenal nodule is suggested if not already performed. Please refer to current clinical guidelines for detailed recommendations. NEJM 2706:237 [id number] Electronically Signed   By: Donzetta Kohut M.D.   On: 01/12/2022 10:45   CT ABDOMEN PELVIS W CONTRAST  Result Date: 01/11/2022 CLINICAL DATA:  Left-sided abdominal pain with diarrhea x1 week EXAM: CT ABDOMEN AND PELVIS WITH CONTRAST TECHNIQUE: Multidetector CT imaging of the abdomen and pelvis was performed using the standard protocol following bolus administration of intravenous contrast. RADIATION DOSE REDUCTION: This exam was performed according to the departmental dose-optimization program which includes automated exposure control, adjustment of the mA and/or kV according to patient size and/or use of iterative reconstruction technique. CONTRAST:  OMNIPAQUE IOHEXOL 300 MG/ML  SOLN COMPARISON:  Right upper quadrant ultrasound February 28, 2020 FINDINGS: Lower chest: No acute abnormality. Hepatobiliary: No suspicious hepatic lesion. Gallbladder surgically absent. No biliary ductal dilation. Pancreas: Inflammation of the pancreatic body and tail with peripancreatic fluid and ductal dilation to the level of a 10 mm calcification in the pancreatic neck  on image 21/2. No walled off fluid collections. No evidence of necrosis. Spleen: No splenomegaly. Adrenals/Urinary Tract: Left adrenal nodule measures 2 cm with Hounsfield units of 82. Right adrenal gland appears normal. Nonobstructive right renal calculi measure up to 3 mm. No obstructive ureteral or bladder calculi identified. Urinary bladder is unremarkable for degree of distension. Stomach/Bowel: No radiopaque enteric contrast material was administered. Stomach is unremarkable for degree of distension. No pathologic dilation of small or large bowel. The appendix and terminal ileum appear normal. No evidence of acute bowel inflammation. Vascular/Lymphatic: Normal caliber abdominal aorta. No pathologically enlarged abdominal or pelvic lymph nodes. Reproductive: Gas in the vagina. Uterus and bilateral adnexa are unremarkable. Other: No walled off fluid collections. No pneumoperitoneum. No portal venous gas. Musculoskeletal: No acute osseous abnormality. IMPRESSION: 1. Acute interstitial pancreatitis involving the body and tail of the pancreas. No walled off fluid collections. 2. Calcification in the pancreatic neck which appears to be located within the pancreatic duct with upstream dilation of the duct and corresponding pancreatic atrophy in the body and tail. Constellation of findings which are most consistent with sequela of chronic pancreatitis and ductal obstruction. Suggest GI consult and further evaluation by nonemergent MRCP with and without contrast preferably as an outpatient upon resolution of patient's current symptomatology when they are better able to follow commands including breath hold. 3. Nonobstructive right renal calculi measure up to 3 mm. 4. Indeterminate 2 cm left adrenal nodule, probably reflecting a benign adenoma. Recommend follow-up adrenal washout CT in 1 year. If stable for = 1 year, no further follow-up imaging. JACR 2017 Aug; 14(8):1038-44, JCAT 2016 Mar-Apr; 40(2):194-200, Urol J  2006 Spring;  3(2):71-4. Electronically Signed   By: Maudry Mayhew M.D.   On: 01/11/2022 18:58  [2 weeks]  Assessment:   Renee [name] is a 41 y.o. year old female with history of hyperparathyroidism, HTN, HSV who presented to the ED yesterday with left sided abdominal pain for the past week with noted stools ranging from diarrhea to constipation, also orange in color. Labs unremarkable, however CT concerning for acute pancreatitis and stones in PD, GI consulted for further evaluation.  Acute Pancreatitis with Pancreatic duct stones: lipase normal, CT concerning for acute pancreatitis and calcification within PD and upstream dilation of the duct and corresponding pancreatic atrophy in the body and tail, consistent with chronic pancreatitis and obstruction. MRCP consistent with acute on chronic pancreatitis with ductal obstruction in the central pancreas in the pancreatic body, resultant near complete atrophy of peripheral pancreas.  No acute pancreatic fluid collection. No signs of splenic vein or portal vein abnormality. Known intraductal calculus not seen currently. Occult neoplasm leading to obstruction cannot be excluded.    Patient denies frequent ETOH use or hx of known pancreatitis, recently stopped smoking. Lipid Panel is WNL. No new medications or herbal supplements. LFTs initially normal, rose to AST 446, ALT 182, AP and T bili remain normal. Today AST 123, ALT 187.  Calcium elevated chronically, currently 11.4 in the setting of primary hyperparathyroidism. Was supposed to have surgery but did not complete due to money.  Patient will need outpatient ERCP and/or EUS for further evaluation of the above MRCP findings. Our team has reached out to Dr. Meridee Score and awaiting return call.    Diarrhea: Patient has positive GI pathogen panel for Campylobacter species (stool collected 01/11/22), resulted yesterday. No BM in two days. No indication for antibiotics.   Plan:   Hopefully home tomorrow.   Will need to trend LFTs.  Will likely need outpatient EUS+/-ERCP, awaiting input from [facility].  Would hold off on treating campylobacter given resolution of diarrhea.    LOS: 1 day   Leanna Battles. Dixon Boos [facility] [phone] 10/5/202312:46 PM

## 2022-01-14 ENCOUNTER — Other Ambulatory Visit: Payer: Self-pay | Admitting: Family Medicine

## 2022-01-14 ENCOUNTER — Telehealth: Payer: Self-pay | Admitting: Gastroenterology

## 2022-01-14 DIAGNOSIS — K8591 Acute pancreatitis with uninfected necrosis, unspecified: Secondary | ICD-10-CM

## 2022-01-14 LAB — COMPREHENSIVE METABOLIC PANEL
ALT: 121 U/L — ABNORMAL HIGH (ref 0–44)
AST: 47 U/L — ABNORMAL HIGH (ref 15–41)
Albumin: 3.3 g/dL — ABNORMAL LOW (ref 3.5–5.0)
Alkaline Phosphatase: 110 U/L (ref 38–126)
Anion gap: 5 (ref 5–15)
BUN: 9 mg/dL (ref 6–20)
CO2: 25 mmol/L (ref 22–32)
Calcium: 11.4 mg/dL — ABNORMAL HIGH (ref 8.9–10.3)
Chloride: 107 mmol/L (ref 98–111)
Creatinine, Ser: 0.78 mg/dL (ref 0.44–1.00)
GFR, Estimated: >60 mL/min (ref >60)
Glucose, Bld: 110 mg/dL — ABNORMAL HIGH (ref 70–99)
Potassium: 3.7 mmol/L (ref 3.5–5.1)
Sodium: 137 mmol/L (ref 135–145)
Total Bilirubin: 0.6 mg/dL (ref 0.3–1.2)
Total Protein: 6.7 g/dL (ref 6.5–8.1)

## 2022-01-14 LAB — PARATHYROID HORMONE, INTACT (NO CA): PTH: 43 pg/mL (ref 15–65)

## 2022-01-14 LAB — CALCIUM, IONIZED: Calcium, Ionized, Serum: 6 mg/dL — ABNORMAL HIGH (ref 4.5–5.6)

## 2022-01-14 NOTE — Telephone Encounter (Signed)
[facility] clinical pool:   Please refer to Dr. Rush Landmark for EUS due to history of pancreatitis, discharged today after admission for acute on chronic pancreatitis. MRCP on file from 10/4 with findings of acute on chronic pancreatitis with ductal obstruction in the central pancreas in pancreatic body, unable to exclude occult neoplasm.  Renee [name]:  Please arrange hospital follow-up in 4 weeks. She was seen by Felipe Drone while inpatient.

## 2022-01-14 NOTE — Addendum Note (Signed)
Addended by: Cheron Every on: 01/14/2022 09:52 AM   Modules accepted: Orders

## 2022-01-14 NOTE — Progress Notes (Signed)
pt called [phone] Pt stated that job is requesting to be more specific  in her work accomodation that she can not lift over 20 lbs in weight limit and that the limitations are because of her symptoms due to her diagnosis of Acute pancreatitis note can be left at nurses station message sent to shah that was her attending

## 2022-01-14 NOTE — Progress Notes (Signed)
Pt stated that she drove herself to hospital, and that nursing staff was to take her to the ED parking lot. Pt  Was alert and able to make needs known. Pt's IV ACCESS WAS dc' and telemetry. This LPN  reviewed AVS with PT and attending sent note for work.

## 2022-01-14 NOTE — Telephone Encounter (Signed)
Unable to refill per protocol, provider is not at this practice. Will refuse.  Requested Prescriptions  Pending Prescriptions Disp Refills  . trimethoprim-polymyxin b (POLYTRIM) ophthalmic solution [Pharmacy Med Name: POLYMYXIN B-TMP EYE DROPS] 10 mL 0    Sig: PLACE 1 DROP INTO THE LEFT EYE EVERY 4 HOURS.     There is no refill protocol information for this order

## 2022-01-14 NOTE — Telephone Encounter (Signed)
Referral placed.

## 2022-01-14 NOTE — Discharge Summary (Signed)
Physician Discharge Summary  Renee LevinsRenate K [name] RUE:[id number] DOB: Jul 08, 1980 DOA: 01/11/2022  PCP: Pcp, No  Admit date: 01/11/2022  Discharge date: 01/14/2022  Admitted From:Home  Disposition:  Home  Recommendations for Outpatient Follow-up:  Follow up with PCP in 1-2 weeks Follow-up with GI will be scheduled for outpatient imaging to include endoscopic ultrasound and ERCP Continue on medications as prior Completed course of treatment with 3 days of Rocephin for UTI Follow-up outpatient for parathyroid resection  Home Health: None  Equipment/Devices: None  Discharge Condition:Stable  CODE STATUS: Full  Diet recommendation: Heart Healthy  Brief/Interim Summary: Renee [name] is a 41 y.o. female with medical history significant of hypertension, hypothyroidism, HSV, and more presents to the ED with a chief complaint of abdominal pain.  Patient reports symptoms started approximately 1 week ago.  She has been admitted with acute pancreatitis.  MRCP showed findings consistent with acute on chronic pancreatitis with ductal obstruction.  GI recommending outpatient follow-up for endoscopic ultrasound/ERCP which will be scheduled.  She is now tolerating diet without any further abdominal symptoms and is in stable condition for discharge.  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  Discharge Instructions  Discharge Instructions     Diet - low sodium heart healthy   Complete by: As directed    Increase activity slowly   Complete by: As directed       Allergies as of 01/14/2022   No Known Allergies      Medication List     STOP taking these medications    ciprofloxacin 500 MG tablet Commonly known  as: CIPRO   metroNIDAZOLE 500 MG tablet Commonly known as: FLAGYL       TAKE these medications    amLODipine 5 MG tablet Commonly known as: NORVASC Take 5 mg by mouth daily.   Dialyvite Vitamin D 5000 125 MCG (5000 UT) capsule Generic drug: Cholecalciferol Take 5,000 Units by mouth daily.   dicyclomine 20 MG tablet Commonly known as: BENTYL Take 1 tablet (20 mg total) by mouth 2 (two) times daily as needed for spasms.   losartan 100 MG tablet Commonly known as: COZAAR Take 100 mg by mouth daily.   naproxen 375 MG tablet Commonly known as: NAPROSYN Take 1 tablet (375 mg total) by mouth 2 (two) times daily with a meal.        No Known Allergies  Consultations: GI   Procedures/Studies: MR ABDOMEN MRCP W WO CONTAST  Result Date: 01/12/2022 CLINICAL DATA:  Acute pancreatitis. EXAM: MRI ABDOMEN WITHOUT AND WITH CONTRAST (INCLUDING MRCP) TECHNIQUE: Multiplanar multisequence MR imaging of the abdomen was performed both before and after the administration of intravenous contrast. Heavily T2-weighted images of the biliary and pancreatic ducts were obtained, and three-dimensional MRCP images were rendered by post processing. CONTRAST:  10mL GADAVIST GADOBUTROL 1 MMOL/ML IV SOLN COMPARISON:  January 11, 2022. FINDINGS: Lower chest: Lung bases are clear to the extent evaluated on this abdominal MRI. Hepatobiliary: Mild hepatic steatosis. No visible lesion in the liver, study is motion degraded. No filling defect in the biliary tree with normal post cholecystectomy appearance of the biliary tree. Pancreas: Signs of acute interstitial edematous pancreatitis about the tail of the pancreas which is nearly completely atrophied around a dilated pancreatic  duct with ectatic side branches. Main pancreatic duct approximately 5-6 mm in the tail. Little if any remaining parenchyma about the body peripherally in the tail of the pancreas. No acute pancreatic fluid collections. The known intraductal  stone in the proximal pancreatic body is not visible on the current study. Remaining parenchyma in the pancreatic head with normal appearance though motion degrades assessment. Portal vein and splenic vein are patent. Spleen:  Normal. Adrenals/Urinary Tract: RIGHT adrenal gland is normal. LEFT adrenal gland with loss of signal on out of phase as compared to in phase T1 weighted gradient echo imaging greater than 50% loss of signal on the current exam. This adrenal adenoma measures approximately 2.2 x 2.1 cm. Renal contours are smooth without perinephric stranding. No hydronephrosis. No suspicious renal lesion. Stomach/Bowel: No acute gastrointestinal findings to the extent evaluated on this abdominal MRI. Vascular/Lymphatic: No pathologically enlarged lymph nodes identified. No abdominal aortic aneurysm demonstrated. Other:  No ascites. Musculoskeletal: No suspicious bone lesions identified. IMPRESSION: 1. Findings of acute on chronic pancreatitis with ductal obstruction in the central pancreas in the pancreatic body, resultant near complete atrophy of peripheral pancreas as seen on recent CT imaging. No acute pancreatic fluid collection. No signs of splenic vein or portal vein abnormality. Known intraductal calculus not seen currently. Preservation of T1 signal 2. Constellation of findings may be due to previous pancreatitis with ductal stricture and subsequent stone formation. Alternatively, occult neoplasm leading to ductal obstruction and these findings cannot be excluded on the basis of this exam. Follow-up endoscopic assessment may be helpful to exclude small occult neoplasm. 3. Mild hepatic steatosis. 4. 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 Compatible with a benign finding for which no additional dedicated follow-up imaging is suggested. Based on current clinical literature, biochemical evaluation to exclude possible functioning adrenal nodule is suggested if not already performed. Please refer to current clinical  guidelines for detailed recommendations. NEJM [id number] Electronically Signed   By: Donzetta Kohut M.D.   On: 01/12/2022 10:45   MR 3D Recon At Scanner  Result Date: 01/12/2022 CLINICAL DATA:  Acute pancreatitis. EXAM: MRI ABDOMEN WITHOUT AND WITH CONTRAST (INCLUDING MRCP) TECHNIQUE: Multiplanar multisequence MR imaging of the abdomen was performed both before and after the administration of intravenous contrast. Heavily T2-weighted images of the biliary and pancreatic ducts were obtained, and three-dimensional MRCP images were rendered by post processing. CONTRAST:  53mL GADAVIST GADOBUTROL 1 MMOL/ML IV SOLN COMPARISON:  January 11, 2022. FINDINGS: Lower chest: Lung bases are clear to the extent evaluated on this abdominal MRI. Hepatobiliary: Mild hepatic steatosis. No visible lesion in the liver, study is motion degraded. No filling defect in the biliary tree with normal post cholecystectomy appearance of the biliary tree. Pancreas: Signs of acute interstitial edematous pancreatitis about the tail of the pancreas which is nearly completely atrophied around a dilated pancreatic duct with ectatic side branches. Main pancreatic duct approximately 5-6 mm in the tail. Little if any remaining parenchyma about the body peripherally in the tail of the pancreas. No acute pancreatic fluid collections. The known intraductal stone in the proximal pancreatic body is not visible on the current study. Remaining parenchyma in the pancreatic head with normal appearance though motion degrades assessment. Portal vein and splenic vein are patent. Spleen:  Normal. Adrenals/Urinary Tract: RIGHT adrenal gland is normal. LEFT adrenal gland with loss of signal on out of phase as compared to in phase T1 weighted gradient echo imaging greater than 50% loss of signal on the current exam. This adrenal  adenoma measures approximately 2.2 x 2.1 cm. Renal contours are smooth without perinephric stranding. No hydronephrosis. No suspicious  renal lesion. Stomach/Bowel: No acute gastrointestinal findings to the extent evaluated on this abdominal MRI. Vascular/Lymphatic: No pathologically enlarged lymph nodes identified. No abdominal aortic aneurysm demonstrated. Other:  No ascites. Musculoskeletal: No suspicious bone lesions identified. IMPRESSION: 1. Findings of acute on chronic pancreatitis with ductal obstruction in the central pancreas in the pancreatic body, resultant near complete atrophy of peripheral pancreas as seen on recent CT imaging. No acute pancreatic fluid collection. No signs of splenic vein or portal vein abnormality. Known intraductal calculus not seen currently. Preservation of T1 signal 2. Constellation of findings may be due to previous pancreatitis with ductal stricture and subsequent stone formation. Alternatively, occult neoplasm leading to ductal obstruction and these findings cannot be excluded on the basis of this exam. Follow-up endoscopic assessment may be helpful to exclude small occult neoplasm. 3. Mild hepatic steatosis. 4. 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 Compatible with a benign finding for which no additional dedicated follow-up imaging is suggested. Based on current clinical literature, biochemical evaluation to exclude possible functioning adrenal nodule is suggested if not already performed. Please refer to current clinical guidelines for detailed recommendations. NEJM [id number]:[id number] Electronically Signed   By: Zetta Bills M.D.   On: 01/12/2022 10:45   CT ABDOMEN PELVIS W CONTRAST  Result Date: 01/11/2022 CLINICAL DATA:  Left-sided abdominal pain with diarrhea x1 week EXAM: CT ABDOMEN AND PELVIS WITH CONTRAST TECHNIQUE: Multidetector CT imaging of the abdomen and pelvis was performed using the standard protocol following bolus administration of intravenous contrast. RADIATION DOSE REDUCTION: This exam was performed according to the departmental dose-optimization program which includes automated exposure  control, adjustment of the mA and/or kV according to patient size and/or use of iterative reconstruction technique. CONTRAST:  166mL OMNIPAQUE IOHEXOL 300 MG/ML  SOLN COMPARISON:  Right upper quadrant ultrasound February 28, 2020 FINDINGS: Lower chest: No acute abnormality. Hepatobiliary: No suspicious hepatic lesion. Gallbladder surgically absent. No biliary ductal dilation. Pancreas: Inflammation of the pancreatic body and tail with peripancreatic fluid and ductal dilation to the level of a 10 mm calcification in the pancreatic neck on image 21/2. No walled off fluid collections. No evidence of necrosis. Spleen: No splenomegaly. Adrenals/Urinary Tract: Left adrenal nodule measures 2 cm with Hounsfield units of 82. Right adrenal gland appears normal. Nonobstructive right renal calculi measure up to 3 mm. No obstructive ureteral or bladder calculi identified. Urinary bladder is unremarkable for degree of distension. Stomach/Bowel: No radiopaque enteric contrast material was administered. Stomach is unremarkable for degree of distension. No pathologic dilation of small or large bowel. The appendix and terminal ileum appear normal. No evidence of acute bowel inflammation. Vascular/Lymphatic: Normal caliber abdominal aorta. No pathologically enlarged abdominal or pelvic lymph nodes. Reproductive: Gas in the vagina. Uterus and bilateral adnexa are unremarkable. Other: No walled off fluid collections. No pneumoperitoneum. No portal venous gas. Musculoskeletal: No acute osseous abnormality. IMPRESSION: 1. Acute interstitial pancreatitis involving the body and tail of the pancreas. No walled off fluid collections. 2. Calcification in the pancreatic neck which appears to be located within the pancreatic duct with upstream dilation of the duct and corresponding pancreatic atrophy in the body and tail. Constellation of findings which are most consistent with sequela of chronic pancreatitis and ductal obstruction. Suggest GI  consult and further evaluation by nonemergent MRCP with and without contrast preferably as an outpatient upon resolution of patient's current symptomatology when they are better able  to follow commands including breath hold. 3. Nonobstructive right renal calculi measure up to 3 mm. 4. Indeterminate 2 cm left adrenal nodule, probably reflecting a benign adenoma. Recommend follow-up adrenal washout CT in 1 year. If stable for = 1 year, no further follow-up imaging. JACR 2017 Aug; 14(8):1038-44, JCAT 2016 Mar-Apr; 40(2):194-200, Urol J 2006 Spring; 3(2):71-4. Electronically Signed   By: Maudry Mayhew M.D.   On: 01/11/2022 18:58     Discharge Exam: Vitals:   01/13/22 2049 01/14/22 0534  BP: 125/74 139/79  Pulse: 73 73  Resp: 18 20  Temp: 98 F (36.7 C) 98.1 F (36.7 C)  SpO2: 99% 99%   Vitals:   01/13/22 0331 01/13/22 1420 01/13/22 2049 01/14/22 0534  BP: 120/70 106/65 125/74 139/79  Pulse: 71 77 73 73  Resp: Temp: (!) 97.2 F (36.2 C) 97.9 F (36.6 C) 98 F (36.7 C) 98.1 F (36.7 C)  TempSrc:  Oral    SpO2: 99% 99% 99% 99%  Weight:      Height:        General: Pt is alert, awake, not in acute distress Cardiovascular: RRR, S1/S2 +, no rubs, no gallops Respiratory: CTA bilaterally, no wheezing, no rhonchi Abdominal: Soft, NT, ND, bowel sounds + Extremities: no edema, no cyanosis    The results of significant diagnostics from this hospitalization (including imaging, microbiology, ancillary and laboratory) are listed below for reference.     Microbiology: Recent Results (from the past 240 hour(s))  Urine Culture     Status: Abnormal   Collection Time: 01/11/22  3:33 PM   Specimen: Urine, Clean Catch  Result Value Ref Range Status   Specimen Description   Final    URINE, CLEAN CATCH Performed at [facility], [address], [place], [place] [address]    Special Requests   Final    NONE Performed at [facility], [address] [place], [place] [address]     Culture MULTIPLE SPECIES PRESENT, SUGGEST RECOLLECTION (A)  Final   Report Status 01/13/2022 FINAL  Final  Gastrointestinal Panel by PCR , Stool     Status: Abnormal   Collection Time: 01/11/22  9:35 PM  Result Value Ref Range Status   Campylobacter species DETECTED (A) NOT DETECTED Final    Comment: RESULT CALLED TO, READ BACK BY AND VERIFIED WITH: Charlsie Quest RN 1148 01/12/22 HNM    Plesimonas shigelloides NOT DETECTED NOT DETECTED Final   Salmonella species NOT DETECTED NOT DETECTED Final   Yersinia enterocolitica NOT DETECTED NOT DETECTED Final   Vibrio species NOT DETECTED NOT DETECTED Final   Vibrio cholerae NOT DETECTED NOT DETECTED Final   Enteroaggregative E coli (EAEC) NOT DETECTED NOT DETECTED Final   Enteropathogenic E coli (EPEC) NOT DETECTED NOT DETECTED Final   Enterotoxigenic E coli (ETEC) NOT DETECTED NOT DETECTED Final   Shiga like toxin producing E coli (STEC) NOT DETECTED NOT DETECTED Final   Shigella/Enteroinvasive E coli (EIEC) NOT DETECTED NOT DETECTED Final   Cryptosporidium NOT DETECTED NOT DETECTED Final   Cyclospora cayetanensis NOT DETECTED NOT DETECTED Final   Entamoeba histolytica NOT DETECTED NOT DETECTED Final   Giardia lamblia NOT DETECTED NOT DETECTED Final   Adenovirus F40/41 NOT DETECTED NOT DETECTED Final   Astrovirus NOT DETECTED NOT DETECTED Final   Norovirus GI/GII NOT DETECTED NOT DETECTED Final   Rotavirus A NOT DETECTED NOT DETECTED Final   Sapovirus (I, II, IV, and V) NOT DETECTED NOT DETECTED Final  Comment: Performed at [facility], [address] [place], [place] [address]     Labs: BNP (last 3 results) No results for input(s): "BNP" in the last 8760 hours. Basic Metabolic Panel: Recent Labs  Lab 01/11/22 1552 01/12/22 0417 01/13/22 0437 01/14/22 0529  NA 138 138 137 137  K 4.0 3.7 4.0 3.7  CL 107 109 110 107  CO2 25 20* 22 25  GLUCOSE 130* 110* 108* 110*  BUN 9 7 11 9   CREATININE 0.94 0.88 0.94 0.78   CALCIUM 11.8* 11.1* 11.4* 11.4*  MG  --  2.2 2.0  --   PHOS  --  2.0*  --   --    Liver Function Tests: Recent Labs  Lab 01/11/22 1552 01/12/22 0417 01/13/22 0437 01/14/22 0529  AST 18 446* 123* 47*  ALT 21 182* 187* 121*  ALKPHOS 88 117 115 110  BILITOT 0.7 0.8 0.6 0.6  PROT 7.3 6.4* 6.4* 6.7  ALBUMIN 3.7 3.2* 3.3* 3.3*   Recent Labs  Lab 01/11/22 1552  LIPASE 35   No results for input(s): "AMMONIA" in the last 168 hours. CBC: Recent Labs  Lab 01/11/22 1552 01/12/22 0417 01/13/22 0437  WBC 6.3 6.6 4.9  NEUTROABS 4.3 4.9  --   HGB 13.7 12.1 11.9*  HCT 42.2 37.5 36.9  MCV 87.9 88.4 88.5  PLT 328 288 263   Cardiac Enzymes: No results for input(s): "CKTOTAL", "CKMB", "CKMBINDEX", "TROPONINI" in the last 168 hours. BNP: Invalid input(s): "POCBNP" CBG: No results for input(s): "GLUCAP" in the last 168 hours. D-Dimer No results for input(s): "DDIMER" in the last 72 hours. Hgb A1c No results for input(s): "HGBA1C" in the last 72 hours. Lipid Profile Recent Labs    01/12/22 0417  CHOL 128  HDL 48  LDLCALC 67  TRIG 66  CHOLHDL 2.7   Thyroid function studies No results for input(s): "TSH", "T4TOTAL", "T3FREE", "THYROIDAB" in the last 72 hours.  Invalid input(s): "FREET3" Anemia work up No results for input(s): "VITAMINB12", "FOLATE", "FERRITIN", "TIBC", "IRON", "RETICCTPCT" in the last 72 hours. Urinalysis    Component Value Date/Time   COLORURINE YELLOW 01/11/2022 1533   APPEARANCEUR TURBID (A) 01/11/2022 1533   LABSPEC 1.019 01/11/2022 1533   PHURINE 5.0 01/11/2022 1533   GLUCOSEU NEGATIVE 01/11/2022 1533   HGBUR NEGATIVE 01/11/2022 1533   BILIRUBINUR NEGATIVE 01/11/2022 1533   KETONESUR NEGATIVE 01/11/2022 1533   PROTEINUR 30 (A) 01/11/2022 1533   NITRITE NEGATIVE 01/11/2022 1533   LEUKOCYTESUR LARGE (A) 01/11/2022 1533   Sepsis Labs Recent Labs  Lab 01/11/22 1552 01/12/22 0417 01/13/22 0437  WBC 6.3 6.6 4.9   Microbiology Recent  Results (from the past 240 hour(s))  Urine Culture     Status: Abnormal   Collection Time: 01/11/22  3:33 PM   Specimen: Urine, Clean Catch  Result Value Ref Range Status   Specimen Description   Final    URINE, CLEAN CATCH Performed at [facility], [address] [place], [place]    Special Requests   Final    NONE Performed at [facility], [address], [place], [place]    Culture MULTIPLE SPECIES PRESENT, SUGGEST RECOLLECTION (A)  Final   Report Status 01/13/2022 FINAL  Final  Gastrointestinal Panel by PCR , Stool     Status: Abnormal   Collection Time: 01/11/22  9:35 PM  Result Value Ref Range Status   Campylobacter species DETECTED (A) NOT DETECTED Final    Comment: RESULT CALLED  TO, READ BACK BY AND VERIFIED WITH: Charlsie Quest RN 1148 01/12/22 HNM    Plesimonas shigelloides NOT DETECTED NOT DETECTED Final   Salmonella species NOT DETECTED NOT DETECTED Final   Yersinia enterocolitica NOT DETECTED NOT DETECTED Final   Vibrio species NOT DETECTED NOT DETECTED Final   Vibrio cholerae NOT DETECTED NOT DETECTED Final   Enteroaggregative E coli (EAEC) NOT DETECTED NOT DETECTED Final   Enteropathogenic E coli (EPEC) NOT DETECTED NOT DETECTED Final   Enterotoxigenic E coli (ETEC) NOT DETECTED NOT DETECTED Final   Shiga like toxin producing E coli (STEC) NOT DETECTED NOT DETECTED Final   Shigella/Enteroinvasive E coli (EIEC) NOT DETECTED NOT DETECTED Final   Cryptosporidium NOT DETECTED NOT DETECTED Final   Cyclospora cayetanensis NOT DETECTED NOT DETECTED Final   Entamoeba histolytica NOT DETECTED NOT DETECTED Final   Giardia lamblia NOT DETECTED NOT DETECTED Final   Adenovirus F40/41 NOT DETECTED NOT DETECTED Final   Astrovirus NOT DETECTED NOT DETECTED Final   Norovirus GI/GII NOT DETECTED NOT DETECTED Final   Rotavirus A NOT DETECTED NOT DETECTED Final   Sapovirus (I, II, IV, and V) NOT DETECTED NOT DETECTED Final    Comment: Performed at [facility], [address] [place], [place] [address]     Time coordinating discharge: 35 minutes  SIGNED:   Erick Blinks, DO [facility] 01/14/2022, 9:07 AM  If 7PM-7AM, please contact night-coverage [email or web address]

## 2022-01-17 NOTE — Telephone Encounter (Signed)
Mindy: just an update. Dr. Rush Landmark is aware of patient. He is recommending an EUS first. Possibly will need ERCP following this pending findings of EUS. I spoke with the patient personally today. I see you already placed the referral, thank you!   Dena: I have drafted a letter for patient. Please print and place at front for her to pick up! Thanks!

## 2022-01-17 NOTE — Telephone Encounter (Signed)
noted 

## 2022-01-17 NOTE — Telephone Encounter (Signed)
Noted and done

## 2022-01-21 ENCOUNTER — Encounter: Payer: Self-pay | Admitting: Internal Medicine

## 2022-01-21 ENCOUNTER — Ambulatory Visit: Payer: BLUE CROSS/BLUE SHIELD | Admitting: Family

## 2022-01-21 ENCOUNTER — Ambulatory Visit: Payer: BC Managed Care – PPO | Admitting: Internal Medicine

## 2022-01-21 VITALS — BP 158/87 | HR 90 | Ht 61.0 in | Wt 254.6 lb

## 2022-01-21 DIAGNOSIS — I10 Essential (primary) hypertension: Secondary | ICD-10-CM

## 2022-01-21 DIAGNOSIS — Z8639 Personal history of other endocrine, nutritional and metabolic disease: Secondary | ICD-10-CM

## 2022-01-21 DIAGNOSIS — Z1321 Encounter for screening for nutritional disorder: Secondary | ICD-10-CM | POA: Diagnosis not present

## 2022-01-21 DIAGNOSIS — Z1329 Encounter for screening for other suspected endocrine disorder: Secondary | ICD-10-CM | POA: Diagnosis not present

## 2022-01-21 DIAGNOSIS — E210 Primary hyperparathyroidism: Secondary | ICD-10-CM

## 2022-01-21 DIAGNOSIS — Z7689 Persons encountering health services in other specified circumstances: Secondary | ICD-10-CM | POA: Diagnosis not present

## 2022-01-21 DIAGNOSIS — Z1331 Encounter for screening for depression: Secondary | ICD-10-CM

## 2022-01-21 DIAGNOSIS — K861 Other chronic pancreatitis: Secondary | ICD-10-CM

## 2022-01-21 DIAGNOSIS — Z131 Encounter for screening for diabetes mellitus: Secondary | ICD-10-CM | POA: Diagnosis not present

## 2022-01-21 DIAGNOSIS — Z9189 Other specified personal risk factors, not elsewhere classified: Secondary | ICD-10-CM

## 2022-01-21 DIAGNOSIS — Z23 Encounter for immunization: Secondary | ICD-10-CM

## 2022-01-21 DIAGNOSIS — Z1159 Encounter for screening for other viral diseases: Secondary | ICD-10-CM

## 2022-01-21 DIAGNOSIS — K859 Acute pancreatitis without necrosis or infection, unspecified: Secondary | ICD-10-CM

## 2022-01-21 NOTE — Progress Notes (Unsigned)
New Patient Office Visit  Subjective    Patient ID: Renee [name], female    DOB: 1980-04-20  Age: 41 y.o. MRN: [id number]  CC:  Chief Complaint  Patient presents with   Establish Care   HPI Renee [name] presents to establish care.  She is a 41 year old woman with a past medical history significant for HTN, hyperparathyroidism, and chronic pancreatitis.  She was recently admitted to [facility] 10/4 - 10/6 after presenting with abdominal pain and found to have acute on chronic pancreatitis.  Underwent MRCP, which showed ductal obstruction and findings consistent with acute on chronic pancreatitis.  Seen by GI during admission and will follow-up outpatient for further management.  She was referred to our practice to establish primary care.  Renee [name] was previously followed at [facility] but has not been seen in multiple years.  Her acute concern today is an inability to lose weight.  Her BMI is 48 and she is aware that she needs to lose weight.  She would like to further discuss this.  She endorses constipation today but is otherwise asymptomatic.  Acute concerns, chronic medical conditions, and outstanding preventative healthcare maintenance items discussed today are individually addressed in A/P below.  Outpatient Encounter Medications as of 01/21/2022  Medication Sig   amLODipine (NORVASC) 5 MG tablet Take 5 mg by mouth daily.   Cholecalciferol (DIALYVITE VITAMIN D 5000) 125 MCG (5000 UT) capsule Take 5,000 Units by mouth daily.   dicyclomine (BENTYL) 20 MG tablet Take 1 tablet (20 mg total) by mouth 2 (two) times daily as needed for spasms.   losartan (COZAAR) 100 MG tablet Take 100 mg by mouth daily.   Multiple Vitamins-Minerals (MULTIVITAMIN WITH MINERALS) tablet Take 1 tablet by mouth daily.   naproxen (NAPROSYN) 375 MG tablet Take 1 tablet (375 mg total) by mouth 2 (two) times daily with a meal.   No facility-administered encounter medications on file as of  01/21/2022.    Past Medical History:  Diagnosis Date   Elevated serum creatinine 02/19/2021   Herpes simplex virus (HSV) infection    no OB +HSV 05/04/16 at [facility]    Hypertension    Hypothyroidism    Vaginal Pap smear, abnormal     Past Surgical History:  Procedure Laterality Date   CHOLECYSTECTOMY N/A 04/13/2020   Procedure: LAPAROSCOPIC CHOLECYSTECTOMY WITH INTRAOPERATIVE CHOLANGIOGRAM;  Surgeon: Armandina Gemma, MD;  Location: [facility];  Service: General;  Laterality: N/A;   KNEE SURGERY     TONSILLECTOMY      Family History  Problem Relation Age of Onset   Congestive Heart Failure Maternal Grandmother    Hypertension Maternal Grandmother    Diabetes Maternal Grandmother    Other Maternal Grandfather        aneursym   Kidney disease Maternal Grandfather        on dialysis   Congestive Heart Failure Maternal Grandfather    Diabetes Maternal Grandfather     Social History   Socioeconomic History   Marital status: Single    Spouse name: Not on file   Number of children: Not on file   Years of education: Not on file   Highest education level: Not on file  Occupational History   Not on file  Tobacco Use   Smoking status: Former    Packs/day: 0.25    Years: 17.00    Total pack years: 4.25    Types: Cigarettes    Quit date: 01/09/2022  Years since quitting: 0.0   Smokeless tobacco: Never   Tobacco comments:    smokes 2-3 cig daily  Vaping Use   Vaping Use: Never used  Substance and Sexual Activity   Alcohol use: Yes    Comment: socially   Drug use: No   Sexual activity: Yes    Birth control/protection: None  Other Topics Concern   Not on file  Social History Narrative   Not on file   Social Determinants of Health   Financial Resource Strain: Medium Risk (02/16/2021)   Overall Financial Resource Strain (CARDIA)    Difficulty of Paying Living Expenses: Somewhat hard  Food Insecurity: No Food Insecurity (01/11/2022)   Hunger Vital Sign    Worried About  Running Out of Food in the Last Year: Never true    Ran Out of Food in the Last Year: Never true  Transportation Needs: No Transportation Needs (01/11/2022)   PRAPARE - Hydrologist (Medical): No    Lack of Transportation (Non-Medical): No  Physical Activity: Inactive (02/16/2021)   Exercise Vital Sign    Days of Exercise per Week: 0 days    Minutes of Exercise per Session: 0 min  Stress: No Stress Concern Present (02/16/2021)   [place]    Feeling of Stress : Only a little  Social Connections: Moderately Isolated (02/16/2021)   Social Connection and Isolation Panel [NHANES]    Frequency of Communication with Friends and Family: Three times a week    Frequency of Social Gatherings with Friends and Family: Twice a week    Attends Religious Services: 1 to 4 times per year    Active Member of [employer] or Organizations: No    Attends Archivist Meetings: Never    Marital Status: Divorced  Human resources officer Violence: Not At Risk (01/11/2022)   Humiliation, Afraid, Rape, and Kick questionnaire    Fear of Current or Ex-Partner: No    Emotionally Abused: No    Physically Abused: No    Sexually Abused: No   Review of Systems  Constitutional:  Negative for chills and fever.  HENT:  Negative for sore throat.   Respiratory:  Negative for cough and shortness of breath.   Cardiovascular:  Negative for chest pain, palpitations and leg swelling.  Gastrointestinal:  Positive for constipation. Negative for abdominal pain, blood in stool, diarrhea, nausea and vomiting.  Genitourinary:  Negative for dysuria and hematuria.  Musculoskeletal:  Negative for myalgias.  Skin:  Negative for itching and rash.  Neurological:  Negative for dizziness and headaches.  Psychiatric/Behavioral:  Negative for depression and suicidal ideas.    Objective    BP (!) 158/87   Pulse 90   Ht 5' 1"  (1.549 m)   Wt 254 lb  9.6 oz (115.5 kg)   LMP 12/28/2021 (Exact Date)   SpO2 98%   BMI 48.11 kg/m   Physical Exam Vitals reviewed.  Constitutional:      General: She is not in acute distress.    Appearance: Normal appearance. She is obese. She is not toxic-appearing.  HENT:     Head: Normocephalic and atraumatic.     Right Ear: External ear normal.     Left Ear: External ear normal.     Nose: Nose normal. No congestion or rhinorrhea.     Mouth/Throat:     Mouth: Mucous membranes are moist.     Pharynx: Oropharynx is clear. No oropharyngeal exudate  or posterior oropharyngeal erythema.  Eyes:     General: No scleral icterus.    Extraocular Movements: Extraocular movements intact.     Conjunctiva/sclera: Conjunctivae normal.     Pupils: Pupils are equal, round, and reactive to light.  Cardiovascular:     Rate and Rhythm: Normal rate and regular rhythm.     Pulses: Normal pulses.     Heart sounds: Normal heart sounds.  Pulmonary:     Effort: Pulmonary effort is normal.     Breath sounds: Normal breath sounds. No wheezing, rhonchi or rales.  Abdominal:     General: Abdomen is flat. Bowel sounds are normal. There is no distension.     Palpations: Abdomen is soft.     Tenderness: There is abdominal tenderness (Mildly tender to epigastric palpation).  Musculoskeletal:        General: Normal range of motion.     Cervical back: Normal range of motion.  Lymphadenopathy:     Cervical: No cervical adenopathy.  Skin:    General: Skin is warm and dry.     Capillary Refill: Capillary refill takes less than 2 seconds.     Coloration: Skin is not jaundiced.  Neurological:     General: No focal deficit present.     Mental Status: She is alert and oriented to person, place, and time.  Psychiatric:        Mood and Affect: Mood normal.        Behavior: Behavior normal.    Last CBC Lab Results  Component Value Date   WBC 6.1 01/21/2022   HGB 12.4 01/21/2022   HCT 39.0 01/21/2022   MCV 88 01/21/2022    MCH 27.9 01/21/2022   RDW 12.5 01/21/2022   PLT 300 49/70/2637   Last metabolic panel Lab Results  Component Value Date   GLUCOSE 105 (H) 01/21/2022   NA 138 01/21/2022   K 4.4 01/21/2022   CL 106 01/21/2022   CO2 22 01/21/2022   BUN 9 01/21/2022   CREATININE 0.86 01/21/2022   EGFR 87 01/21/2022   CALCIUM 12.0 (H) 01/21/2022   PHOS 2.0 (L) 01/12/2022   PROT 6.5 01/21/2022   ALBUMIN 4.0 01/21/2022   LABGLOB 2.5 01/21/2022   AGRATIO 1.6 01/21/2022   BILITOT 0.3 01/21/2022   ALKPHOS 104 01/21/2022   AST 16 01/21/2022   ALT 22 01/21/2022   ANIONGAP 5 01/14/2022   Last lipids Lab Results  Component Value Date   CHOL 128 01/12/2022   HDL 48 01/12/2022   LDLCALC 67 01/12/2022   TRIG 66 01/12/2022   CHOLHDL 2.7 01/12/2022   Last hemoglobin A1c Lab Results  Component Value Date   HGBA1C 5.6 01/21/2022   Last thyroid functions Lab Results  Component Value Date   TSH 1.900 01/21/2022   Last vitamin D Lab Results  Component Value Date   VD25OH 25.4 (L) 01/21/2022    Assessment & Plan:   Problem List Items Addressed This Visit       Essential hypertension    BP 158/87 today.  She is currently prescribed losartan 100 mg daily and amlodipine 5 mg daily. -No changes today -She will follow-up in 4 weeks for BP check.  I have asked her to keep a blood pressure log in the interim.      Acute on chronic pancreatitis [facility])    Recent admission to [facility] for acute on chronic pancreatitis.  MRCP showed ductal dilatation.  Seen by GI during admission and will  follow-up with them outpatient 10/31.  She states that her symptoms have significantly improved today.  She does have mild epigastric tenderness on exam but otherwise feels well.  She has been able to eat without exacerbation of her pain.      Hyperparathyroidism, primary ([place])    Hypercalcemia noted on labs.  PTH normal during recent admission, but previously elevated in November 2022.  She states that  she is followed by endocrinology and has an appointment next month to discuss parathyroidectomy. 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  She states that she will undergo parathyroidectomy soon.  Followed by endocrinology.  Records requested today.      History of vitamin D deficiency    She reports a history of vitamin D deficiency.  Currently on supplementation.  Repeat vitamin D level ordered today.      Risk factors for obstructive sleep apnea    She possesses multiple risk factors for OSA and reports that she has been told that she snores loudly.  States that she was supposed to undergo PSG. -PSG ordered today      Positive screening for depression on 9-item Patient Health Questionnaire (PHQ-9)    PHQ-9 score elevated today (12).  She endorses a depressed mood.  Denies SI/HI.  She is interested in referral to psychiatry for counseling.  Referral placed today.      Encounter to establish care    Presenting today to establish care.  Available medical records and labs reviewed. -Repeat labs ordered today, including one-time HCV screening -Influenza vaccine administered today -Additional HM items are up-to-date      Return in about 4 weeks (around 02/18/2022).   Johnette Abraham, MD

## 2022-01-21 NOTE — Patient Instructions (Signed)
It was a pleasure to see you today.  Thank you for giving [place] the opportunity to be involved in your care.  Below is a brief recap of your visit and next steps.  We will plan to see you again in 4 weeks.  Summary You have established care today. We will check labs and request records from [facility]. Follow up in 4 weeks to discuss weight loss and high blood pressure I have also placed a referral to psychiatry for counseling

## 2022-01-22 LAB — CMP14+EGFR
ALT: 22 IU/L (ref 0–32)
AST: 16 IU/L (ref 0–40)
Albumin/Globulin Ratio: 1.6 (ref 1.2–2.2)
Albumin: 4 g/dL (ref 3.9–4.9)
Alkaline Phosphatase: 104 IU/L (ref 44–121)
BUN/Creatinine Ratio: 10 (ref 9–23)
BUN: 9 mg/dL (ref 6–24)
Bilirubin Total: 0.3 mg/dL (ref 0.0–1.2)
CO2: 22 mmol/L (ref 20–29)
Calcium: 12 mg/dL — ABNORMAL HIGH (ref 8.7–10.2)
Chloride: 106 mmol/L (ref 96–106)
Creatinine, Ser: 0.86 mg/dL (ref 0.57–1.00)
Globulin, Total: 2.5 g/dL (ref 1.5–4.5)
Glucose: 105 mg/dL — ABNORMAL HIGH (ref 70–99)
Potassium: 4.4 mmol/L (ref 3.5–5.2)
Sodium: 138 mmol/L (ref 134–144)
Total Protein: 6.5 g/dL (ref 6.0–8.5)
eGFR: 87 mL/min/{1.73_m2} (ref >59)

## 2022-01-22 LAB — CBC WITH DIFFERENTIAL/PLATELET
Basophils Absolute: 0 10*3/uL (ref 0.0–0.2)
Basos: 0 %
EOS (ABSOLUTE): 0.2 10*3/uL (ref 0.0–0.4)
Eos: 4 %
Hematocrit: 39 % (ref 34.0–46.6)
Hemoglobin: 12.4 g/dL (ref 11.1–15.9)
Immature Grans (Abs): 0 10*3/uL (ref 0.0–0.1)
Immature Granulocytes: 0 %
Lymphocytes Absolute: 1.4 10*3/uL (ref 0.7–3.1)
Lymphs: 22 %
MCH: 27.9 pg (ref 26.6–33.0)
MCHC: 31.8 g/dL (ref 31.5–35.7)
MCV: 88 fL (ref 79–97)
Monocytes Absolute: 0.8 10*3/uL (ref 0.1–0.9)
Monocytes: 12 %
Neutrophils Absolute: 3.7 10*3/uL (ref 1.4–7.0)
Neutrophils: 62 %
Platelets: 300 10*3/uL (ref 150–450)
RBC: 4.44 x10E6/uL (ref 3.77–5.28)
RDW: 12.5 % (ref 11.7–15.4)
WBC: 6.1 10*3/uL (ref 3.4–10.8)

## 2022-01-22 LAB — HEMOGLOBIN A1C
Est. average glucose Bld gHb Est-mCnc: 114 mg/dL
Hgb A1c MFr Bld: 5.6 % (ref 4.8–5.6)

## 2022-01-22 LAB — TSH+FREE T4
Free T4: 1.66 ng/dL (ref 0.82–1.77)
TSH: 1.9 u[IU]/mL (ref 0.450–4.500)

## 2022-01-22 LAB — VITAMIN D 25 HYDROXY (VIT D DEFICIENCY, FRACTURES): Vit D, 25-Hydroxy: 25.4 ng/mL — ABNORMAL LOW (ref 30.0–100.0)

## 2022-01-22 LAB — HCV INTERPRETATION

## 2022-01-22 LAB — HCV AB W REFLEX TO QUANT PCR: HCV Ab: NONREACTIVE

## 2022-01-25 NOTE — Telephone Encounter (Signed)
Under referrals sent 10/6

## 2022-01-25 NOTE — Telephone Encounter (Signed)
[name]:  I don't see referral in system although I know it was placed. Can we make sure? Thanks!

## 2022-01-26 ENCOUNTER — Encounter: Payer: Self-pay | Admitting: Internal Medicine

## 2022-01-26 DIAGNOSIS — Z8639 Personal history of other endocrine, nutritional and metabolic disease: Secondary | ICD-10-CM | POA: Insufficient documentation

## 2022-01-26 DIAGNOSIS — Z7689 Persons encountering health services in other specified circumstances: Secondary | ICD-10-CM | POA: Insufficient documentation

## 2022-01-26 DIAGNOSIS — Z1331 Encounter for screening for depression: Secondary | ICD-10-CM | POA: Insufficient documentation

## 2022-01-26 DIAGNOSIS — Z9189 Other specified personal risk factors, not elsewhere classified: Secondary | ICD-10-CM | POA: Insufficient documentation

## 2022-01-26 NOTE — Assessment & Plan Note (Signed)
She possesses multiple risk factors for OSA and reports that she has been told that she snores loudly.  States that she was supposed to undergo PSG. -PSG ordered today

## 2022-01-26 NOTE — Assessment & Plan Note (Signed)
BP 158/87 today.  She is currently prescribed losartan 100 mg daily and amlodipine 5 mg daily. -No changes today -She will follow-up in 4 weeks for BP check.  I have asked her to keep a blood pressure log in the interim.

## 2022-01-26 NOTE — Assessment & Plan Note (Signed)
Presenting today to establish care.  Available medical records and labs reviewed. -Repeat labs ordered today, including one-time HCV screening -Influenza vaccine administered today -Additional HM items are up-to-date

## 2022-01-26 NOTE — Assessment & Plan Note (Signed)
PHQ-9 score elevated today (12).  She endorses a depressed mood.  Denies SI/HI.  She is interested in referral to psychiatry for counseling.  Referral placed today.

## 2022-01-26 NOTE — Assessment & Plan Note (Signed)
Recent admission to [facility] for acute on chronic pancreatitis.  MRCP showed ductal dilatation.  Seen by GI during admission and will follow-up with them outpatient 10/31.  She states that her symptoms have significantly improved today.  She does have mild epigastric tenderness on exam but otherwise feels well.  She has been able to eat without exacerbation of her pain.

## 2022-01-26 NOTE — Assessment & Plan Note (Signed)
She reports a history of vitamin D deficiency.  Currently on supplementation.  Repeat vitamin D level ordered today.

## 2022-01-26 NOTE — Assessment & Plan Note (Addendum)
Hypercalcemia noted on labs.  PTH normal during recent admission, but previously elevated in November 2022.  She states that she is followed by endocrinology and has an appointment next month to discuss parathyroidectomy. -Records from [place] have been requested today

## 2022-01-26 NOTE — Assessment & Plan Note (Signed)
Secondary to hyperparathyroidism.  She states that she will undergo parathyroidectomy soon.  Followed by endocrinology.  Records requested today.

## 2022-02-08 ENCOUNTER — Other Ambulatory Visit: Payer: Self-pay | Admitting: Gastroenterology

## 2022-02-08 ENCOUNTER — Encounter: Payer: Self-pay | Admitting: Gastroenterology

## 2022-02-08 ENCOUNTER — Ambulatory Visit (INDEPENDENT_AMBULATORY_CARE_PROVIDER_SITE_OTHER): Payer: BC Managed Care – PPO | Admitting: Gastroenterology

## 2022-02-08 VITALS — BP 129/85 | HR 83 | Temp 98.5°F | Ht 61.0 in | Wt 252.8 lb

## 2022-02-08 DIAGNOSIS — K859 Acute pancreatitis without necrosis or infection, unspecified: Secondary | ICD-10-CM

## 2022-02-08 DIAGNOSIS — R7989 Other specified abnormal findings of blood chemistry: Secondary | ICD-10-CM | POA: Diagnosis not present

## 2022-02-08 DIAGNOSIS — K8689 Other specified diseases of pancreas: Secondary | ICD-10-CM

## 2022-02-08 DIAGNOSIS — K861 Other chronic pancreatitis: Secondary | ICD-10-CM

## 2022-02-08 DIAGNOSIS — K5900 Constipation, unspecified: Secondary | ICD-10-CM | POA: Diagnosis not present

## 2022-02-08 MED ORDER — LINACLOTIDE 145 MCG PO CAPS: 145.0000 ug | ORAL_CAPSULE | Freq: Every day | ORAL | 5 refills | Status: DC

## 2022-02-08 NOTE — Patient Instructions (Addendum)
We will reach out to Dr. Rush Landmark regarding referral. If you do not hear from their practice this week, please call our office.  Start Linzess 157mcg daily on empty stomach for constipation. Let me know if there is any issues at the pharmacy. I could not tell which medication was on your insurance plan. Please follow up with endocrinology to have your parathyroid issues addressed. Ongoing elevated calcium levels can cause further issues with your pancreas. Continue low fat diet.  Return office visit in 3 months.

## 2022-02-08 NOTE — Progress Notes (Signed)
GI Office Note    Referring Provider: No ref. provider found Primary Care Physician:  Johnette Abraham, MD  Primary Gastroenterologist: Elon Alas. Abbey Chatters, DO   Chief Complaint   Chief Complaint  Patient presents with   Hospitalization [facility] follow up for pancreatitis. States that she is still having pain that is only slightly better.    History of Present Illness   Renee [name] is a 41 y.o. female presenting today for hospital follow-up.  Patient was hospitalized earlier this month with pancreatitis.  He has a history of hyperparathyroidism, hypertension, HSV who presented to the ED with left-sided abdominal pain, range stools ranging from diarrhea to constipation.    CT imaging concerning for acute pancreatitis and calcification in the pancreatic duct with upstream dilation and corresponding pancreatic atrophy in the body and tail, consistent with chronic pancreatitis and ductal obstruction.  MRCP consistent with acute on chronic pancreatitis with ductal obstruction in the central pancreas, resulting in near complete atrophy of the peripheral pancreas.  Intraductal calculus cannot be seen, occult neoplasm leading to obstruction not excluded..  Lipase was normal.  Patient denies frequent ETOH use or hx of known pancreatitis, recently stopped smoking. Drinks etoh only socially, maybe 2-3 drinks at most with last drink 3-4 days prior to onset of symptoms. Lipid Panel is WNL. No new medications or herbal supplements. LFTs initially normal, rose to AST 446, ALT 182, AP and T bili remain normal. Today AST 123, ALT 187.  Calcium elevated chronically, currently 11.4 in the setting of primary hyperparathyroidism. Was supposed to have surgery but did not complete due to money.  While inpatient, outpatient GI pathogen panel returned positive for Campylobacter species.  She had no further diarrhea therefore antibiotics not indicated.  Referral to Dr. Rush Landmark for EUS  planned.  May require ERCP depending on findings.  Today: Some postprandial ruq pain. Doesn't happen all the time. It varies. No vomiting. Staying constipated. Stool softener if real backed. [place] 1-2. No melena, brbpr. Drinking plenty of fluids. Trying to eat low fat. No heartburn. Sees endocrinologist next month. Has not heard from referral regarding EUS yet. States she has had to miss some work due to abdominal pain. Requesting intermittent FMLA papers to be completed.   Medications   Current Outpatient Medications  Medication Sig Dispense Refill   amLODipine (NORVASC) 5 MG tablet Take 5 mg by mouth daily.     Cholecalciferol (DIALYVITE VITAMIN D 5000) 125 MCG (5000 UT) capsule Take 5,000 Units by mouth daily.     dicyclomine (BENTYL) 20 MG tablet Take 1 tablet (20 mg total) by mouth 2 (two) times daily as needed for spasms. 20 tablet 0   losartan (COZAAR) 100 MG tablet Take 100 mg by mouth daily.     Multiple Vitamins-Minerals (MULTIVITAMIN WITH MINERALS) tablet Take 1 tablet by mouth daily.     naproxen (NAPROSYN) 375 MG tablet Take 1 tablet (375 mg total) by mouth 2 (two) times daily with a meal. 60 tablet 5   No current facility-administered medications for this visit.    Allergies   Allergies as of 02/08/2022   (No Known Allergies)       Review of Systems   General: Negative for anorexia, weight loss, fever, chills, fatigue, weakness. ENT: Negative for hoarseness, difficulty swallowing , nasal congestion. CV: Negative for chest pain, angina, palpitations, dyspnea on exertion, peripheral edema.  Respiratory: Negative for dyspnea at rest, dyspnea on exertion, cough, sputum, wheezing.  GI: See history of present illness. GU:  Negative for dysuria, hematuria, urinary incontinence, urinary frequency, nocturnal urination.  Endo: Negative for unusual weight change.     Physical Exam   BP 129/85 (BP Location: Right Arm, Patient Position: Sitting, Cuff Size: Large)   Pulse 83    Temp 98.5 F (36.9 C) (Oral)   Ht 5\' 1"  (1.549 m)   Wt 252 lb 12.8 oz (114.7 kg)   LMP 01/18/2022 (Approximate)   SpO2 96%   BMI 47.77 kg/m    General: Well-nourished, well-developed in no acute distress.  Eyes: No icterus. Mouth: Oropharyngeal mucosa moist and pink , no lesions erythema or exudate. Lungs: Clear to auscultation bilaterally.  Heart: Regular rate and rhythm, no murmurs rubs or gallops.  Abdomen: Bowel sounds are normal,  nondistended, no hepatosplenomegaly or masses,  no abdominal bruits or hernia , no rebound or guarding. Mild luq tenderness Rectal: not performed  Extremities: No lower extremity edema. No clubbing or deformities. Neuro: Alert and oriented x 4   Skin: Warm and dry, no jaundice.   Psych: Alert and cooperative, normal mood and affect.  Labs   Lab Results  Component Value Date   CREATININE 0.86 01/21/2022   BUN 9 01/21/2022   NA 138 01/21/2022   K 4.4 01/21/2022   CL 106 01/21/2022   CO2 22 01/21/2022   Lab Results  Component Value Date   ALT 22 01/21/2022   AST 16 01/21/2022   ALKPHOS 104 01/21/2022   BILITOT 0.3 01/21/2022   Lab Results  Component Value Date   WBC 6.1 01/21/2022   HGB 12.4 01/21/2022   HCT 39.0 01/21/2022   MCV 88 01/21/2022   PLT 300 01/21/2022   Lab Results  Component Value Date   HGBA1C 5.6 01/21/2022   Lab Results  Component Value Date   TSH 1.900 01/21/2022    Imaging Studies   MR ABDOMEN MRCP W WO CONTAST  Result Date: 01/12/2022 CLINICAL DATA:  Acute pancreatitis. EXAM: MRI ABDOMEN WITHOUT AND WITH CONTRAST (INCLUDING MRCP) TECHNIQUE: Multiplanar multisequence MR imaging of the abdomen was performed both before and after the administration of intravenous contrast. Heavily T2-weighted images of the biliary and pancreatic ducts were obtained, and three-dimensional MRCP images were rendered by post processing. CONTRAST:  20mL GADAVIST GADOBUTROL 1 MMOL/ML IV SOLN COMPARISON:  January 11, 2022. FINDINGS:  Lower chest: Lung bases are clear to the extent evaluated on this abdominal MRI. Hepatobiliary: Mild hepatic steatosis. No visible lesion in the liver, study is motion degraded. No filling defect in the biliary tree with normal post cholecystectomy appearance of the biliary tree. Pancreas: Signs of acute interstitial edematous pancreatitis about the tail of the pancreas which is nearly completely atrophied around a dilated pancreatic duct with ectatic side branches. Main pancreatic duct approximately 5-6 mm in the tail. Little if any remaining parenchyma about the body peripherally in the tail of the pancreas. No acute pancreatic fluid collections. The known intraductal stone in the proximal pancreatic body is not visible on the current study. Remaining parenchyma in the pancreatic head with normal appearance though motion degrades assessment. Portal vein and splenic vein are patent. Spleen:  Normal. Adrenals/Urinary Tract: RIGHT adrenal gland is normal. LEFT adrenal gland with loss of signal on out of phase as compared to in phase T1 weighted gradient echo imaging greater than 50% loss of signal on the current exam. This adrenal adenoma measures approximately 2.2 x 2.1 cm. Renal contours are smooth without perinephric stranding. No  hydronephrosis. No suspicious renal lesion. Stomach/Bowel: No acute gastrointestinal findings to the extent evaluated on this abdominal MRI. Vascular/Lymphatic: No pathologically enlarged lymph nodes identified. No abdominal aortic aneurysm demonstrated. Other:  No ascites. Musculoskeletal: No suspicious bone lesions identified. IMPRESSION: 1. Findings of acute on chronic pancreatitis with ductal obstruction in the central pancreas in the pancreatic body, resultant near complete atrophy of peripheral pancreas as seen on recent CT imaging. No acute pancreatic fluid collection. No signs of splenic vein or portal vein abnormality. Known intraductal calculus not seen currently. Preservation  of T1 signal 2. Constellation of findings may be due to previous pancreatitis with ductal stricture and subsequent stone formation. Alternatively, occult neoplasm leading to ductal obstruction and these findings cannot be excluded on the basis of this exam. Follow-up endoscopic assessment may be helpful to exclude small occult neoplasm. 3. Mild hepatic steatosis. 4. 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 Compatible with a benign finding for which no additional dedicated follow-up imaging is suggested. Based on current clinical literature, biochemical evaluation to exclude possible functioning adrenal nodule is suggested if not already performed. Please refer to current clinical guidelines for detailed recommendations. NEJM [id number] Electronically Signed   By: Donzetta Kohut M.D.   On: 01/12/2022 10:45   MR 3D Recon At Scanner  Result Date: 01/12/2022 CLINICAL DATA:  Acute pancreatitis. EXAM: MRI ABDOMEN WITHOUT AND WITH CONTRAST (INCLUDING MRCP) TECHNIQUE: Multiplanar multisequence MR imaging of the abdomen was performed both before and after the administration of intravenous contrast. Heavily T2-weighted images of the biliary and pancreatic ducts were obtained, and three-dimensional MRCP images were rendered by post processing. CONTRAST:  46mL GADAVIST GADOBUTROL 1 MMOL/ML IV SOLN COMPARISON:  January 11, 2022. FINDINGS: Lower chest: Lung bases are clear to the extent evaluated on this abdominal MRI. Hepatobiliary: Mild hepatic steatosis. No visible lesion in the liver, study is motion degraded. No filling defect in the biliary tree with normal post cholecystectomy appearance of the biliary tree. Pancreas: Signs of acute interstitial edematous pancreatitis about the tail of the pancreas which is nearly completely atrophied around a dilated pancreatic duct with ectatic side branches. Main pancreatic duct approximately 5-6 mm in the tail. Little if any remaining parenchyma about the body peripherally in the tail of  the pancreas. No acute pancreatic fluid collections. The known intraductal stone in the proximal pancreatic body is not visible on the current study. Remaining parenchyma in the pancreatic head with normal appearance though motion degrades assessment. Portal vein and splenic vein are patent. Spleen:  Normal. Adrenals/Urinary Tract: RIGHT adrenal gland is normal. LEFT adrenal gland with loss of signal on out of phase as compared to in phase T1 weighted gradient echo imaging greater than 50% loss of signal on the current exam. This adrenal adenoma measures approximately 2.2 x 2.1 cm. Renal contours are smooth without perinephric stranding. No hydronephrosis. No suspicious renal lesion. Stomach/Bowel: No acute gastrointestinal findings to the extent evaluated on this abdominal MRI. Vascular/Lymphatic: No pathologically enlarged lymph nodes identified. No abdominal aortic aneurysm demonstrated. Other:  No ascites. Musculoskeletal: No suspicious bone lesions identified. IMPRESSION: 1. Findings of acute on chronic pancreatitis with ductal obstruction in the central pancreas in the pancreatic body, resultant near complete atrophy of peripheral pancreas as seen on recent CT imaging. No acute pancreatic fluid collection. No signs of splenic vein or portal vein abnormality. Known intraductal calculus not seen currently. Preservation of T1 signal 2. Constellation of findings may be due to previous pancreatitis with ductal stricture and subsequent stone formation.  Alternatively, occult neoplasm leading to ductal obstruction and these findings cannot be excluded on the basis of this exam. Follow-up endoscopic assessment may be helpful to exclude small occult neoplasm. 3. Mild hepatic steatosis. 4. 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 Compatible with a benign finding for which no additional dedicated follow-up imaging is suggested. Based on current clinical literature, biochemical evaluation to exclude possible functioning adrenal nodule  is suggested if not already performed. Please refer to current clinical guidelines for detailed recommendations. NEJM [id number] Electronically Signed   By: Zetta Bills M.D.   On: 01/12/2022 10:45   CT ABDOMEN PELVIS W CONTRAST  Result Date: 01/11/2022 CLINICAL DATA:  Left-sided abdominal pain with diarrhea x1 week EXAM: CT ABDOMEN AND PELVIS WITH CONTRAST TECHNIQUE: Multidetector CT imaging of the abdomen and pelvis was performed using the standard protocol following bolus administration of intravenous contrast. RADIATION DOSE REDUCTION: This exam was performed according to the departmental dose-optimization program which includes automated exposure control, adjustment of the mA and/or kV according to patient size and/or use of iterative reconstruction technique. CONTRAST:  171mL OMNIPAQUE IOHEXOL 300 MG/ML  SOLN COMPARISON:  Right upper quadrant ultrasound February 28, 2020 FINDINGS: Lower chest: No acute abnormality. Hepatobiliary: No suspicious hepatic lesion. Gallbladder surgically absent. No biliary ductal dilation. Pancreas: Inflammation of the pancreatic body and tail with peripancreatic fluid and ductal dilation to the level of a 10 mm calcification in the pancreatic neck on image 21/2. No walled off fluid collections. No evidence of necrosis. Spleen: No splenomegaly. Adrenals/Urinary Tract: Left adrenal nodule measures 2 cm with Hounsfield units of 82. Right adrenal gland appears normal. Nonobstructive right renal calculi measure up to 3 mm. No obstructive ureteral or bladder calculi identified. Urinary bladder is unremarkable for degree of distension. Stomach/Bowel: No radiopaque enteric contrast material was administered. Stomach is unremarkable for degree of distension. No pathologic dilation of small or large bowel. The appendix and terminal ileum appear normal. No evidence of acute bowel inflammation. Vascular/Lymphatic: Normal caliber abdominal aorta. No pathologically enlarged abdominal  or pelvic lymph nodes. Reproductive: Gas in the vagina. Uterus and bilateral adnexa are unremarkable. Other: No walled off fluid collections. No pneumoperitoneum. No portal venous gas. Musculoskeletal: No acute osseous abnormality. IMPRESSION: 1. Acute interstitial pancreatitis involving the body and tail of the pancreas. No walled off fluid collections. 2. Calcification in the pancreatic neck which appears to be located within the pancreatic duct with upstream dilation of the duct and corresponding pancreatic atrophy in the body and tail. Constellation of findings which are most consistent with sequela of chronic pancreatitis and ductal obstruction. Suggest GI consult and further evaluation by nonemergent MRCP with and without contrast preferably as an outpatient upon resolution of patient's current symptomatology when they are better able to follow commands including breath hold. 3. Nonobstructive right renal calculi measure up to 3 mm. 4. Indeterminate 2 cm left adrenal nodule, probably reflecting a benign adenoma. Recommend follow-up adrenal washout CT in 1 year. If stable for = 1 year, no further follow-up imaging. JACR 2017 Aug; 14(8):1038-44, JCAT 2016 Mar-Apr; 40(2):194-200, Urol J 2006 Spring; 3(2):71-4. Electronically Signed   By: Dahlia Bailiff M.D.   On: 01/11/2022 18:58    Assessment   Acute on chronic pancreatitis/dilated pancreatic duct: Ductal obstruction noted in the center of the pancreas with resulting atrophy of the body and tail of the pancreas.  Initial CT with suggestion of calcification within the pancreatic duct but this was not seen on MRI.  Awaiting EUS plus or minus  ERCP, referral pending.  It is suspected that chronic hypercalcemia may be contributing to overall picture.  Patient has follow-up with endocrinology next month, previously advised to undergo surgery for hyperparathyroidism but she did not complete due to financial issues at the time.  Denies frequent or heavy alcohol  consumption.  Currently has intermittent right upper quadrant postprandial pain, can go days without any pain.  Encouraged low-fat diet.  Avoid alcohol.  Follow-up with endocrinology.  We will follow-up on this referral.  Constipation: Add Linzess 145 mcg daily.  Elevated LFTs: Normal January 11, 2022.  AST and ALT bumped up to 446/2200 on October 4.  October 13 numbers normal again.  Etiology unclear.  Continue to follow.   PLAN   We will reach out regarding pending referral for EUS. Linzess 145 mcg daily on empty stomach. Low-fat diet. No alcohol. Follow-up with endocrinology as planned. Return office visit here in 3 months.  Laureen Ochs. Bobby Rumpf, [employer], [facility]

## 2022-02-10 ENCOUNTER — Encounter: Payer: Self-pay | Admitting: Internal Medicine

## 2022-02-10 ENCOUNTER — Telehealth: Payer: Self-pay

## 2022-02-10 ENCOUNTER — Ambulatory Visit: Payer: BC Managed Care – PPO | Admitting: Internal Medicine

## 2022-02-10 VITALS — BP 124/86 | HR 78 | Ht 61.0 in | Wt 251.6 lb

## 2022-02-10 DIAGNOSIS — Z9189 Other specified personal risk factors, not elsewhere classified: Secondary | ICD-10-CM | POA: Diagnosis not present

## 2022-02-10 DIAGNOSIS — K859 Acute pancreatitis without necrosis or infection, unspecified: Secondary | ICD-10-CM

## 2022-02-10 DIAGNOSIS — I10 Essential (primary) hypertension: Secondary | ICD-10-CM | POA: Diagnosis not present

## 2022-02-10 DIAGNOSIS — Z1331 Encounter for screening for depression: Secondary | ICD-10-CM | POA: Diagnosis not present

## 2022-02-10 DIAGNOSIS — K861 Other chronic pancreatitis: Secondary | ICD-10-CM

## 2022-02-10 DIAGNOSIS — E210 Primary hyperparathyroidism: Secondary | ICD-10-CM

## 2022-02-10 NOTE — Patient Instructions (Signed)
It was a pleasure to see you today.  Thank you for giving [place] the opportunity to be involved in your care.  Below is a brief recap of your visit and next steps.  We will plan to see you again in 2 months  Summary I have ordered a sleep study today We will follow up on the psychiatry referral We will work on completing your FMLA paperwork Follow up in 2 months

## 2022-02-10 NOTE — Progress Notes (Signed)
Established Patient Office Visit  Subjective   Patient ID: Renee [name], female    DOB: November 21, 1980  Age: 41 y.o. MRN: [id number]  Chief Complaint  Patient presents with   Follow-up   Renee [name] returns to care today.  She is a 41 year old woman with past medical history significant for HTN, hyperparathyroidism, and chronic pancreatitis.  She was last seen by me on 10/13 to establish care planning recent hospital admission for acute on chronic pancreatitis.  Repeat labs ordered, she was referred to psychiatry due to an interest in counseling and an elevated PHQ-9 score.  A PSG was also ordered as she has multiple risk factors for OSA and was previously supposed to undergo PSG.  4-week follow-up was arranged.  In the interim she has been seen by GI.  EUS is pending.  Today Renee [name] states that she feels well.  She has no acute concerns to discuss aside from needing to get FMLA paperwork completed for her recent hospital admission.  Past Medical History:  Diagnosis Date   Elevated serum creatinine 02/19/2021   Herpes simplex virus (HSV) infection    no OB +HSV 05/04/16 at [facility]    Hypertension    Hypothyroidism    Vaginal Pap smear, abnormal    Past Surgical History:  Procedure Laterality Date   CHOLECYSTECTOMY N/A 04/13/2020   Procedure: LAPAROSCOPIC CHOLECYSTECTOMY WITH INTRAOPERATIVE CHOLANGIOGRAM;  Surgeon: Armandina Gemma, MD;  Location: [facility];  Service: General;  Laterality: N/A;   KNEE SURGERY     TONSILLECTOMY     Social History   Tobacco Use   Smoking status: Former    Packs/day: 0.25    Years: 17.00    Total pack years: 4.25    Types: Cigarettes    Quit date: 01/09/2022    Years since quitting: 0.1   Smokeless tobacco: Never   Tobacco comments:    smokes 2-3 cig daily  Vaping Use   Vaping Use: Never used  Substance Use Topics   Alcohol use: Not Currently    Comment: socially   Drug use: Yes    Types: Marijuana    Comment: occasionally   Family History   Problem Relation Age of Onset   Congestive Heart Failure Maternal Grandmother    Hypertension Maternal Grandmother    Diabetes Maternal Grandmother    Other Maternal Grandfather        aneursym   Kidney disease Maternal Grandfather        on dialysis   Congestive Heart Failure Maternal Grandfather    Diabetes Maternal Grandfather    No Known Allergies    ROS    Objective:     BP 124/86   Pulse 78   Ht _0  (1.549 m)   Wt 251 lb 9.6 oz (114.1 kg)   LMP 01/18/2022 (Approximate)   SpO2 98%   BMI 47.54 kg/m  BP Readings from Last 3 Encounters:  02/16/22 (!) 138/100  02/10/22 124/86  02/08/22 129/85   Physical Exam   Last CBC Lab Results  Component Value Date   WBC 6.1 01/21/2022   HGB 12.4 01/21/2022   HCT 39.0 01/21/2022   MCV 88 01/21/2022   MCH 27.9 01/21/2022   RDW 12.5 01/21/2022   PLT 300 65/06/5463   Last metabolic panel Lab Results  Component Value Date   GLUCOSE 109 (H) 02/16/2022   NA 140 02/16/2022   K 4.6 02/16/2022   CL 108 (H) 02/16/2022   CO2 20 02/16/2022  BUN 9 02/16/2022   CREATININE 0.83 02/16/2022   EGFR 91 02/16/2022   CALCIUM 12.4 (H) 02/16/2022   PHOS 2.0 (L) 01/12/2022   PROT 7.4 02/16/2022   ALBUMIN 4.4 02/16/2022   LABGLOB 3.0 02/16/2022   AGRATIO 1.5 02/16/2022   BILITOT 0.2 02/16/2022   ALKPHOS 113 02/16/2022   AST 14 02/16/2022   ALT 11 02/16/2022   ANIONGAP 5 01/14/2022   Last lipids Lab Results  Component Value Date   CHOL 128 01/12/2022   HDL 48 01/12/2022   LDLCALC 67 01/12/2022   TRIG 66 01/12/2022   CHOLHDL 2.7 01/12/2022   Last hemoglobin A1c Lab Results  Component Value Date   HGBA1C 5.6 01/21/2022   Last thyroid functions Lab Results  Component Value Date   TSH 1.900 01/21/2022   Last vitamin D Lab Results  Component Value Date   VD25OH 25.4 (L) 01/21/2022     Assessment & Plan:   Problem List Items Addressed This Visit       Essential hypertension    BP 124/86 today, previously  elevated troponins of establish care.  Her current regimen consists of losartan 100 mg daily and amlodipine 5 mg daily.  No changes today.        Acute on chronic pancreatitis ([place])    Followed by GI.  EUS pending.  Asymptomatic currently.      Hyperparathyroidism, primary ([place])    Likely underlying cause of recurrent pancreatitis.  She will follow-up with endocrinology later this month to discuss parathyroidectomy.      Risk factors for obstructive sleep apnea - Primary   Relevant Orders   PSG Sleep Study   Positive screening for depression on 9-item Patient Health Questionnaire (PHQ-9)    She reports feeling well today.  Her PHQ-9 score was previously elevated and she was referred to psychiatry for counseling.  PHQ score 0 today.  Psychiatry referral pending.       Return in about 2 months (around 04/12/2022).    Johnette Abraham, MD

## 2022-02-10 NOTE — Telephone Encounter (Signed)
FMLA   Copied Noted Sleeved  Appt 11.02.2023

## 2022-02-11 ENCOUNTER — Telehealth: Payer: Self-pay

## 2022-02-11 ENCOUNTER — Other Ambulatory Visit: Payer: Self-pay

## 2022-02-11 DIAGNOSIS — K8689 Other specified diseases of pancreas: Secondary | ICD-10-CM

## 2022-02-11 DIAGNOSIS — K859 Acute pancreatitis without necrosis or infection, unspecified: Secondary | ICD-10-CM

## 2022-02-11 NOTE — Telephone Encounter (Signed)
-----   Message from Irving Copas., MD sent at 02/10/2022  6:01 AM EDT ----- I have reviewed the patient's chart. Looks like she was referred for EUS to evaluate the pancreatic abnormality and ductal dilation. Please move forward with scheduling EUS linear to evaluate this.  Depending on the findings (if a stone is found versus a stricture then need to talk with her in clinic to discuss potential role of ERCP.  I am not scheduling this as an ERCP currently. Direct EUS procedure is fine. Please update the referring GI team when she has been scheduled. Thanks. GM ----- Message ----- From: Mindi Curling D Sent: 02/08/2022  10:31 AM EDT To: Irving Copas., MD  Referral received. Per Taje Littler, Please review and advise. Thank you

## 2022-02-11 NOTE — Telephone Encounter (Signed)
EUS has been scheduled on 12/21 at 830 am at [facility] with GM   Left message on machine to call back

## 2022-02-14 ENCOUNTER — Other Ambulatory Visit: Payer: Self-pay | Admitting: Internal Medicine

## 2022-02-14 MED ORDER — AMLODIPINE BESYLATE 5 MG PO TABS: 5.0000 mg | ORAL_TABLET | Freq: Every day | ORAL | 2 refills | Status: DC

## 2022-02-14 NOTE — Telephone Encounter (Signed)
Left message on machine to call back  

## 2022-02-15 NOTE — Telephone Encounter (Signed)
Left message on machine to call back  

## 2022-02-15 NOTE — Telephone Encounter (Signed)
EUS scheduled, pt instructed and medications reviewed.  Patient instructions mailed to home.  Patient to call with any questions or concerns.  

## 2022-02-16 ENCOUNTER — Ambulatory Visit
Admission: EM | Admit: 2022-02-16 | Discharge: 2022-02-16 | Disposition: A | Discharge status: Home / Self Care | Payer: BC Managed Care – PPO | Attending: Family Medicine | Admitting: Family Medicine

## 2022-02-16 ENCOUNTER — Encounter: Payer: Self-pay | Admitting: Emergency Medicine

## 2022-02-16 ENCOUNTER — Other Ambulatory Visit: Payer: Self-pay

## 2022-02-16 DIAGNOSIS — R1010 Upper abdominal pain, unspecified: Secondary | ICD-10-CM | POA: Diagnosis not present

## 2022-02-16 DIAGNOSIS — Z8719 Personal history of other diseases of the digestive system: Secondary | ICD-10-CM | POA: Insufficient documentation

## 2022-02-16 DIAGNOSIS — R3129 Other microscopic hematuria: Secondary | ICD-10-CM | POA: Diagnosis not present

## 2022-02-16 DIAGNOSIS — R110 Nausea: Secondary | ICD-10-CM | POA: Diagnosis not present

## 2022-02-16 LAB — POCT URINALYSIS DIP (MANUAL ENTRY)
Bilirubin, UA: NEGATIVE
Glucose, UA: NEGATIVE mg/dL
Ketones, POC UA: NEGATIVE mg/dL
Nitrite, UA: NEGATIVE
Protein Ur, POC: NEGATIVE mg/dL
Spec Grav, UA: >=1.03 — AB (ref 1.010–1.025)
Urobilinogen, UA: 0.2 E.U./dL
pH, UA: 5.5 (ref 5.0–8.0)

## 2022-02-16 LAB — POCT URINE PREGNANCY: Preg Test, Ur: NEGATIVE

## 2022-02-16 MED ORDER — ONDANSETRON 4 MG PO TBDP: 4.0000 mg | ORAL_TABLET | Freq: Three times a day (TID) | ORAL | 0 refills | Status: DC | PRN

## 2022-02-16 NOTE — ED Provider Notes (Signed)
[facility]    CSN: [id number] Arrival date & time: 02/16/22  1249      History   Chief Complaint Chief Complaint  Patient presents with   Nausea    HPI Renee [name] is a 41 y.o. female.   Patient presenting today with several day history of nausea, upper abdominal pain.  Denies fever, chills, vomiting, diarrhea, constipation, upper respiratory symptoms, new foods or medications.  Not trying anything over-the-counter for symptoms.  Was hospitalized 1 month ago with pancreatitis and states she has not been quite herself since.  Followed by a GI specialist for this now but does not have an appointment or further testing scheduled until next month.    Past Medical History:  Diagnosis Date   Elevated serum creatinine 02/19/2021   Herpes simplex virus (HSV) infection    no OB +HSV 05/04/16 at [facility]    Hypertension    Hypothyroidism    Vaginal Pap smear, abnormal     Patient Active Problem List   Diagnosis Date Noted   Constipation 02/08/2022   History of vitamin D deficiency 01/26/2022   Risk factors for obstructive sleep apnea 01/26/2022   Positive screening for depression on 9-item Patient Health Questionnaire (PHQ-9) 01/26/2022   Encounter to establish care 01/26/2022   Elevated LFTs    Essential hypertension 01/12/2022   UTI (urinary tract infection) 01/12/2022   Pancreatic duct dilated    Acute on chronic pancreatitis (HCC) 01/11/2022   Hypercalcemia 02/19/2021   Elevated serum creatinine 02/19/2021   Screening mammogram for breast cancer 02/16/2021   Encounter for screening fecal occult blood testing 02/16/2021   Encounter for gynecological examination with Papanicolaou smear of cervix 02/16/2021   Cholelithiasis with chronic cholecystitis 04/13/2020   Cholelithiasis with cholecystitis 04/02/2020   Hyperparathyroidism, primary (HCC) 04/02/2020   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/2015    Past Surgical History:  Procedure Laterality Date    CHOLECYSTECTOMY N/A 04/13/2020   Procedure: LAPAROSCOPIC CHOLECYSTECTOMY WITH INTRAOPERATIVE CHOLANGIOGRAM;  Surgeon: Darnell Level, MD;  Location: [facility];  Service: General;  Laterality: N/A;   KNEE SURGERY     TONSILLECTOMY      OB History     Gravida  2   Para      Term      Preterm      AB  2   Living         SAB      IAB      Ectopic      Multiple      Live Births               Home Medications    Prior to Admission medications   Medication Sig Start Date End Date Taking? Authorizing Provider  ondansetron (ZOFRAN-ODT) 4 MG disintegrating tablet Take 1 tablet (4 mg total) by mouth every 8 (eight) hours as needed for nausea or vomiting. 02/16/22  Yes Particia Nearing, PA-C  amLODipine (NORVASC) 5 MG tablet Take 1 tablet (5 mg total) by mouth daily. 02/14/22 05/15/22  Billie Lade, MD  Cholecalciferol (DIALYVITE VITAMIN D 5000) 125 MCG (5000 UT) capsule Take 5,000 Units by mouth daily.    [provider]  losartan (COZAAR) 100 MG tablet Take 100 mg by mouth daily.    [provider]  lubiprostone (AMITIZA) 24 MCG capsule Take 1 capsule (24 mcg total) by mouth 2 (two) times daily with a meal. 02/09/22   Tiffany Kocher, PA-C  Multiple Vitamins-Minerals (MULTIVITAMIN  WITH MINERALS) tablet Take 1 tablet by mouth daily.    [provider]  naproxen (NAPROSYN) 375 MG tablet Take 1 tablet (375 mg total) by mouth 2 (two) times daily with a meal. 03/11/21   Sanjuana Kava, MD    Family History Family History  Problem Relation Age of Onset   Congestive Heart Failure Maternal Grandmother    Hypertension Maternal Grandmother    Diabetes Maternal Grandmother    Other Maternal Grandfather        aneursym   Kidney disease Maternal Grandfather        on dialysis   Congestive Heart Failure Maternal Grandfather    Diabetes Maternal Grandfather     Social History Social History   Tobacco Use   Smoking status: Former    Packs/day: 0.25     Years: 17.00    Total pack years: 4.25    Types: Cigarettes    Quit date: 01/09/2022    Years since quitting: 0.1   Smokeless tobacco: Never   Tobacco comments:    smokes 2-3 cig daily  Vaping Use   Vaping Use: Never used  Substance Use Topics   Alcohol use: Not Currently    Comment: socially   Drug use: Yes    Types: Marijuana    Comment: occasionally     Allergies   Patient has no known allergies.   Review of Systems Review of Systems Per HPI  Physical Exam Triage Vital Signs ED Triage Vitals [02/16/22 1424]  Enc Vitals Group     BP (!) 138/100     Pulse Rate 75     Resp 20     Temp 98.7 F (37.1 C)     Temp Source Oral     SpO2 96 %     Weight      Height      Head Circumference      Peak Flow      Pain Score 5     Pain Loc      Pain Edu?      Excl. in [place]?    No data found.  Updated Vital Signs BP (!) 138/100 (BP Location: Right Arm) Comment: pt reports is on bp medication but has not taken it since nausea started.  Pulse 75   Temp 98.7 F (37.1 C) (Oral)   Resp 20   LMP 01/18/2022 (Approximate)   SpO2 96%   Visual Acuity Right Eye Distance:   Left Eye Distance:   Bilateral Distance:    Right Eye Near:   Left Eye Near:    Bilateral Near:     Physical Exam Vitals and nursing note reviewed.  Constitutional:      Appearance: Normal appearance. She is not ill-appearing.  HENT:     Head: Atraumatic.  Eyes:     Extraocular Movements: Extraocular movements intact.     Conjunctiva/sclera: Conjunctivae normal.  Cardiovascular:     Rate and Rhythm: Normal rate and regular rhythm.     Heart sounds: Normal heart sounds.  Pulmonary:     Effort: Pulmonary effort is normal.     Breath sounds: Normal breath sounds.  Abdominal:     General: Bowel sounds are normal. There is no distension.     Palpations: Abdomen is soft.     Tenderness: There is abdominal tenderness. There is no right CVA tenderness, left CVA tenderness or guarding.      Comments: Mild upper abdominal tenderness to palpation without guarding extending to the  left upper quadrant  Musculoskeletal:        General: Normal range of motion.     Cervical back: Normal range of motion and neck supple.  Skin:    General: Skin is warm and dry.  Neurological:     Mental Status: She is alert and oriented to person, place, and time.  Psychiatric:        Mood and Affect: Mood normal.        Thought Content: Thought content normal.        Judgment: Judgment normal.      UC Treatments / Results  Labs (all labs ordered are listed, but only abnormal results are displayed) Labs Reviewed  POCT URINALYSIS DIP (MANUAL ENTRY) - Abnormal; Notable for the following components:      Result Value   Spec Grav, UA >=1.030 (*)    Blood, UA trace-intact (*)    Leukocytes, UA Small (1+) (*)    All other components within normal limits  URINE CULTURE  COMPREHENSIVE METABOLIC PANEL  LIPASE  POCT URINE PREGNANCY    EKG   Radiology No results found.  Procedures Procedures (including critical care time)  Medications Ordered in UC Medications - No data to display  Initial Impression / Assessment and Plan / UC Course  I have reviewed the triage vital signs and the nursing notes.  Pertinent labs & imaging results that were available during my care of the patient were reviewed by me and considered in my medical decision making (see chart for details).     Urinalysis today showing small leuks and trace RBCs, likely secondary to mild dehydration as she states she has not been drinking much or eating much with the nausea.  Urine culture pending for further rule out of this.  We will also obtain lipase and CMP given her recent history of pancreatitis and similar symptoms now.  Treat with Zofran, fluids, brat diet and close GI specialist follow-up.  Go to the emergency department if worsening symptoms.  Work note given.  Final Clinical Impressions(s) / UC Diagnoses   Final  diagnoses:  Microscopic hematuria  Upper abdominal pain  Nausea without vomiting  History of pancreatitis     Discharge Instructions      Call your GI specialist when you leave to see if they can get you in for a follow-up in the next few days due to your new symptoms.  If your symptoms severely worsen, go to the emergency department    ED Prescriptions     Medication Sig Dispense Auth. Provider   ondansetron (ZOFRAN-ODT) 4 MG disintegrating tablet Take 1 tablet (4 mg total) by mouth every 8 (eight) hours as needed for nausea or vomiting. 20 tablet Volney American, [place]      PDMP not reviewed this encounter.   Volney American, [place] 02/16/22 1501

## 2022-02-16 NOTE — Discharge Instructions (Signed)
Call your GI specialist when you leave to see if they can get you in for a follow-up in the next few days due to your new symptoms.  If your symptoms severely worsen, go to the emergency department

## 2022-02-16 NOTE — ED Triage Notes (Addendum)
Pt reports nausea with intermittent abd pain for last several days. Pt reports was hospitalized with pancreatitis x1 month ago. Pt denies any known fever, diarrhea, emesis, known exposures.

## 2022-02-17 LAB — URINE CULTURE

## 2022-02-17 LAB — COMPREHENSIVE METABOLIC PANEL
ALT: 11 IU/L (ref 0–32)
AST: 14 IU/L (ref 0–40)
Albumin/Globulin Ratio: 1.5 (ref 1.2–2.2)
Albumin: 4.4 g/dL (ref 3.9–4.9)
Alkaline Phosphatase: 113 IU/L (ref 44–121)
BUN/Creatinine Ratio: 11 (ref 9–23)
BUN: 9 mg/dL (ref 6–24)
Bilirubin Total: 0.2 mg/dL (ref 0.0–1.2)
CO2: 20 mmol/L (ref 20–29)
Calcium: 12.4 mg/dL — ABNORMAL HIGH (ref 8.7–10.2)
Chloride: 108 mmol/L — ABNORMAL HIGH (ref 96–106)
Creatinine, Ser: 0.83 mg/dL (ref 0.57–1.00)
Globulin, Total: 3 g/dL (ref 1.5–4.5)
Glucose: 109 mg/dL — ABNORMAL HIGH (ref 70–99)
Potassium: 4.6 mmol/L (ref 3.5–5.2)
Sodium: 140 mmol/L (ref 134–144)
Total Protein: 7.4 g/dL (ref 6.0–8.5)
eGFR: 91 mL/min/{1.73_m2} (ref >59)

## 2022-02-17 LAB — LIPASE: Lipase: 18 U/L (ref 14–72)

## 2022-02-17 NOTE — Assessment & Plan Note (Signed)
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  She will follow-up with endocrinology later this month to discuss parathyroidectomy.

## 2022-02-17 NOTE — Assessment & Plan Note (Signed)
She reports feeling well today.  Her PHQ-9 score was previously elevated and she was referred to psychiatry for counseling.  PHQ score 0 today.  Psychiatry referral pending.

## 2022-02-17 NOTE — Assessment & Plan Note (Signed)
BP 124/86 today, previously elevated troponins of establish care.  Her current regimen consists of losartan 100 mg daily and amlodipine 5 mg daily.  No changes today.

## 2022-02-17 NOTE — Assessment & Plan Note (Signed)
Followed by GI.  EUS pending.  Asymptomatic currently.

## 2022-02-18 ENCOUNTER — Ambulatory Visit: Payer: BC Managed Care – PPO | Admitting: Internal Medicine

## 2022-02-22 ENCOUNTER — Telehealth: Payer: Self-pay

## 2022-02-22 DIAGNOSIS — M810 Age-related osteoporosis without current pathological fracture: Secondary | ICD-10-CM | POA: Diagnosis not present

## 2022-02-22 DIAGNOSIS — I10 Essential (primary) hypertension: Secondary | ICD-10-CM | POA: Diagnosis not present

## 2022-02-22 DIAGNOSIS — E210 Primary hyperparathyroidism: Secondary | ICD-10-CM | POA: Diagnosis not present

## 2022-02-22 DIAGNOSIS — E559 Vitamin D deficiency, unspecified: Secondary | ICD-10-CM | POA: Diagnosis not present

## 2022-02-22 NOTE — Telephone Encounter (Signed)
Pt called stating that she is needing FMLA filled out to be out intermittently for her pancreatitis. Pt states that she was out the end of last week beginning of this week for abdominal pain and nausea and her employer doesn't accept work notes.

## 2022-02-22 NOTE — Telephone Encounter (Signed)
Lmom for pt to return my call.  

## 2022-02-22 NOTE — Telephone Encounter (Signed)
Ok for [employer] to be completed for intermittent absences related to pancreatitis.

## 2022-02-23 ENCOUNTER — Other Ambulatory Visit (HOSPITAL_COMMUNITY): Payer: Self-pay | Admitting: Adult Health

## 2022-02-23 DIAGNOSIS — Z1231 Encounter for screening mammogram for malignant neoplasm of breast: Secondary | ICD-10-CM

## 2022-02-23 NOTE — Telephone Encounter (Signed)
Informed pt to bring FMLA forms by the office to have them filled out.

## 2022-02-23 NOTE — Telephone Encounter (Signed)
Received forms, they are needing to know the number of episodes per week/month and how many hours, days or weeks per episode. They are also needing to know the number of appointments per week or month.

## 2022-02-24 NOTE — Telephone Encounter (Signed)
It is an estimate as we can't predict completely.   She may have flare of pancreatitis with possible 3-4 episodes per month, symptoms could last 3-4 days per episodes. She may require 3 appointments per month. Has EUS scheduled for 03/31/22.

## 2022-02-24 NOTE — Telephone Encounter (Signed)
The form has been filled out and is in your box awaiting your signature to be faxed.

## 2022-02-28 ENCOUNTER — Telehealth: Payer: Self-pay | Admitting: Internal Medicine

## 2022-02-28 DIAGNOSIS — M810 Age-related osteoporosis without current pathological fracture: Secondary | ICD-10-CM | POA: Diagnosis not present

## 2022-02-28 NOTE — Telephone Encounter (Signed)
Pt's FMLA papers are in the drawer up front. Patient is aware and there's a $29 fee. Patient needs to fill out her portion, copies made for scanning.

## 2022-02-28 NOTE — Telephone Encounter (Signed)
Noted  

## 2022-02-28 NOTE — Telephone Encounter (Signed)
noted 

## 2022-03-01 NOTE — Telephone Encounter (Signed)
Previously signed and returned.

## 2022-03-11 ENCOUNTER — Ambulatory Visit (HOSPITAL_COMMUNITY)
Admission: RE | Admit: 2022-03-11 | Discharge: 2022-03-11 | Disposition: A | Discharge status: Home / Self Care | Payer: BC Managed Care – PPO | Source: Ambulatory Visit | Attending: Adult Health | Admitting: Adult Health

## 2022-03-11 DIAGNOSIS — Z1231 Encounter for screening mammogram for malignant neoplasm of breast: Secondary | ICD-10-CM | POA: Diagnosis not present

## 2022-03-16 ENCOUNTER — Ambulatory Visit: Payer: Self-pay | Admitting: Surgery

## 2022-03-16 DIAGNOSIS — E210 Primary hyperparathyroidism: Secondary | ICD-10-CM | POA: Diagnosis not present

## 2022-03-23 ENCOUNTER — Encounter (HOSPITAL_COMMUNITY): Payer: Self-pay | Admitting: Gastroenterology

## 2022-03-23 NOTE — Progress Notes (Signed)
Attempted to obtain medical history via telephone, unable to reach at this time. HIPAA compliant voicemail message left requesting return call to pre surgical testing department. 

## 2022-03-25 ENCOUNTER — Encounter: Payer: Self-pay | Admitting: Emergency Medicine

## 2022-03-25 ENCOUNTER — Ambulatory Visit
Admission: EM | Admit: 2022-03-25 | Discharge: 2022-03-25 | Disposition: A | Discharge status: Home / Self Care | Payer: BC Managed Care – PPO | Attending: Family Medicine | Admitting: Family Medicine

## 2022-03-25 DIAGNOSIS — J069 Acute upper respiratory infection, unspecified: Secondary | ICD-10-CM

## 2022-03-25 DIAGNOSIS — J029 Acute pharyngitis, unspecified: Secondary | ICD-10-CM | POA: Diagnosis not present

## 2022-03-25 DIAGNOSIS — R059 Cough, unspecified: Secondary | ICD-10-CM | POA: Diagnosis not present

## 2022-03-25 DIAGNOSIS — Z1152 Encounter for screening for COVID-19: Secondary | ICD-10-CM | POA: Insufficient documentation

## 2022-03-25 LAB — RESP PANEL BY RT-PCR (FLU A&B, COVID) ARPGX2
Influenza A by PCR: NEGATIVE
Influenza B by PCR: NEGATIVE
SARS Coronavirus 2 by RT PCR: NEGATIVE

## 2022-03-25 MED ORDER — FLUTICASONE PROPIONATE 50 MCG/ACT NA SUSP: 1.0000 | Freq: Two times a day (BID) | NASAL | 2 refills | Status: DC

## 2022-03-25 MED ORDER — PROMETHAZINE-DM 6.25-15 MG/5ML PO SYRP: 5.0000 mL | ORAL_SOLUTION | Freq: Four times a day (QID) | ORAL | 0 refills | Status: DC | PRN

## 2022-03-25 NOTE — ED Provider Notes (Signed)
[facility]    CSN: [id number] Arrival date & time: 03/25/22  1424      History   Chief Complaint No chief complaint on file.   HPI Renee [name] is a 41 y.o. female.   Presenting today with 1 day history of sore throat, congestion, cough.  Denies fever, chills, body aches, chest pain, shortness of breath, abdominal pain, nausea vomiting or diarrhea.  So far not trying anything over-the-counter for symptoms.  No known sick contacts recently.  No known pertinent chronic medical problems.    Past Medical History:  Diagnosis Date   Elevated serum creatinine 02/19/2021   Herpes simplex virus (HSV) infection    no OB +HSV 05/04/16 at [facility]    Hypertension    Hypothyroidism    Vaginal Pap smear, abnormal     Patient Active Problem List   Diagnosis Date Noted   Constipation 02/08/2022   History of vitamin D deficiency 01/26/2022   Risk factors for obstructive sleep apnea 01/26/2022   Positive screening for depression on 9-item Patient Health Questionnaire (PHQ-9) 01/26/2022   Encounter to establish care 01/26/2022   Elevated LFTs    Essential hypertension 01/12/2022   UTI (urinary tract infection) 01/12/2022   Pancreatic duct dilated    Acute on chronic pancreatitis (HCC) 01/11/2022   Hypercalcemia 02/19/2021   Elevated serum creatinine 02/19/2021   Screening mammogram for breast cancer 02/16/2021   Encounter for screening fecal occult blood testing 02/16/2021   Encounter for gynecological examination with Papanicolaou smear of cervix 02/16/2021   Cholelithiasis with chronic cholecystitis 04/13/2020   Cholelithiasis with cholecystitis 04/02/2020   Hyperparathyroidism, primary (HCC) 04/02/2020   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/2015    Past Surgical History:  Procedure Laterality Date   CHOLECYSTECTOMY N/A 04/13/2020   Procedure: LAPAROSCOPIC CHOLECYSTECTOMY WITH INTRAOPERATIVE CHOLANGIOGRAM;  Surgeon: Darnell Level, MD;  Location: [facility];  Service: General;   Laterality: N/A;   KNEE SURGERY     TONSILLECTOMY      OB History     Gravida  2   Para      Term      Preterm      AB  2   Living         SAB      IAB      Ectopic      Multiple      Live Births               Home Medications    Prior to Admission medications   Medication Sig Start Date End Date Taking? Authorizing Provider  fluticasone (FLONASE) 50 MCG/ACT nasal spray Place 1 spray into both nostrils 2 (two) times daily. 03/25/22  Yes Particia Nearing, PA-C  promethazine-dextromethorphan (PROMETHAZINE-DM) 6.25-15 MG/5ML syrup Take 5 mLs by mouth 4 (four) times daily as needed. 03/25/22  Yes Particia Nearing, PA-C  amLODipine (NORVASC) 5 MG tablet Take 1 tablet (5 mg total) by mouth daily. 02/14/22 05/15/22  Billie Lade, MD  Cholecalciferol (DIALYVITE VITAMIN D 5000) 125 MCG (5000 UT) capsule Take 5,000 Units by mouth daily.    [provider]  losartan (COZAAR) 100 MG tablet Take 100 mg by mouth daily.    [provider]  lubiprostone (AMITIZA) 24 MCG capsule Take 1 capsule (24 mcg total) by mouth 2 (two) times daily with a meal. 02/09/22   Tiffany Kocher, PA-C  Multiple Vitamins-Minerals (MULTIVITAMIN WITH MINERALS) tablet Take 1 tablet by mouth daily.  [provider]  naproxen (NAPROSYN) 375 MG tablet Take 1 tablet (375 mg total) by mouth 2 (two) times daily with a meal. 03/11/21   Darreld Mclean, MD  ondansetron (ZOFRAN-ODT) 4 MG disintegrating tablet Take 1 tablet (4 mg total) by mouth every 8 (eight) hours as needed for nausea or vomiting. 02/16/22   Particia Nearing, PA-C    Family History Family History  Problem Relation Age of Onset   Congestive Heart Failure Maternal Grandmother    Hypertension Maternal Grandmother    Diabetes Maternal Grandmother    Other Maternal Grandfather        aneursym   Kidney disease Maternal Grandfather        on dialysis   Congestive Heart Failure Maternal Grandfather     Diabetes Maternal Grandfather     Social History Social History   Tobacco Use   Smoking status: Former    Packs/day: 0.25    Years: 17.00    Total pack years: 4.25    Types: Cigarettes    Quit date: 01/09/2022    Years since quitting: 0.2   Smokeless tobacco: Never   Tobacco comments:    smokes 2-3 cig daily  Vaping Use   Vaping Use: Never used  Substance Use Topics   Alcohol use: Not Currently    Comment: socially   Drug use: Yes    Types: Marijuana    Comment: occasionally     Allergies   Patient has no known allergies.   Review of Systems Review of Systems PER HPI  Physical Exam Triage Vital Signs ED Triage Vitals  Enc Vitals Group     BP 03/25/22 1544 (!) 142/92     Pulse Rate 03/25/22 1544 73     Resp 03/25/22 1544 18     Temp 03/25/22 1544 98.8 F (37.1 C)     Temp Source 03/25/22 1544 Oral     SpO2 03/25/22 1544 97 %     Weight --      Height --      Head Circumference --      Peak Flow --      Pain Score 03/25/22 1545 2     Pain Loc --      Pain Edu? --      Excl. in [place]? --    No data found.  Updated Vital Signs BP (!) 142/92 (BP Location: Right Arm)   Pulse 73   Temp 98.8 F (37.1 C) (Oral)   Resp 18   LMP 03/24/2022 (Exact Date)   SpO2 97%   Visual Acuity Right Eye Distance:   Left Eye Distance:   Bilateral Distance:    Right Eye Near:   Left Eye Near:    Bilateral Near:     Physical Exam Vitals and nursing note reviewed.  Constitutional:      Appearance: Normal appearance.  HENT:     Head: Atraumatic.     Right Ear: Tympanic membrane and external ear normal.     Left Ear: Tympanic membrane and external ear normal.     Nose: Rhinorrhea present.     Mouth/Throat:     Mouth: Mucous membranes are moist.     Pharynx: Posterior oropharyngeal erythema present.  Eyes:     Extraocular Movements: Extraocular movements intact.     Conjunctiva/sclera: Conjunctivae normal.  Cardiovascular:     Rate and Rhythm: Normal rate and  regular rhythm.     Heart sounds: Normal heart sounds.  Pulmonary:  Effort: Pulmonary effort is normal.     Breath sounds: Normal breath sounds. No wheezing or rales.  Musculoskeletal:        General: Normal range of motion.     Cervical back: Normal range of motion and neck supple.  Skin:    General: Skin is warm and dry.  Neurological:     Mental Status: She is alert and oriented to person, place, and time.  Psychiatric:        Mood and Affect: Mood normal.        Thought Content: Thought content normal.      UC Treatments / Results  Labs (all labs ordered are listed, but only abnormal results are displayed) Labs Reviewed  RESP PANEL BY RT-PCR (FLU A&B, COVID) [id number]    EKG   Radiology No results found.  Procedures Procedures (including critical care time)  Medications Ordered in UC Medications - No data to display  Initial Impression / Assessment and Plan / UC Course  I have reviewed the triage vital signs and the nursing notes.  Pertinent labs & imaging results that were available during my care of the patient were reviewed by me and considered in my medical decision making (see chart for details).     Ros and exam reassuring and suspicious for viral respiratory infection.  Respiratory panel pending, treat with Phenergan DM, Flonase, supportive home care.  Return for worsening symptoms.  Work note given.  Final Clinical Impressions(s) / UC Diagnoses   Final diagnoses:  Viral URI with cough   Discharge Instructions   None    ED Prescriptions     Medication Sig Dispense Auth. Provider   promethazine-dextromethorphan (PROMETHAZINE-DM) 6.25-15 MG/5ML syrup Take 5 mLs by mouth 4 (four) times daily as needed. 100 mL Particia Nearing, PA-C   fluticasone [facility]) 50 MCG/ACT nasal spray Place 1 spray into both nostrils 2 (two) times daily. 16 g Particia Nearing, [place]      PDMP not reviewed this encounter.   Particia Nearing,  [place] 03/25/22 1642

## 2022-03-25 NOTE — ED Triage Notes (Signed)
Scratchy throat, nasal congestion and cough.  Symptoms started today.

## 2022-03-31 ENCOUNTER — Other Ambulatory Visit: Payer: Self-pay

## 2022-03-31 ENCOUNTER — Encounter (HOSPITAL_COMMUNITY): Payer: Self-pay | Admitting: Gastroenterology

## 2022-03-31 ENCOUNTER — Encounter (HOSPITAL_COMMUNITY)
Admission: RE | Disposition: A | Discharge status: Home / Self Care | Payer: Self-pay | Source: Home / Self Care | Attending: Gastroenterology

## 2022-03-31 ENCOUNTER — Ambulatory Visit (HOSPITAL_COMMUNITY)
Admission: RE | Admit: 2022-03-31 | Discharge: 2022-03-31 | Disposition: A | Discharge status: Home / Self Care | Payer: BC Managed Care – PPO | Attending: Gastroenterology | Admitting: Gastroenterology

## 2022-03-31 ENCOUNTER — Ambulatory Visit (HOSPITAL_COMMUNITY): Payer: BC Managed Care – PPO | Admitting: Certified Registered Nurse Anesthetist

## 2022-03-31 DIAGNOSIS — K319 Disease of stomach and duodenum, unspecified: Secondary | ICD-10-CM | POA: Insufficient documentation

## 2022-03-31 DIAGNOSIS — K8689 Other specified diseases of pancreas: Secondary | ICD-10-CM | POA: Diagnosis not present

## 2022-03-31 DIAGNOSIS — K2289 Other specified disease of esophagus: Secondary | ICD-10-CM | POA: Diagnosis not present

## 2022-03-31 DIAGNOSIS — I10 Essential (primary) hypertension: Secondary | ICD-10-CM | POA: Insufficient documentation

## 2022-03-31 DIAGNOSIS — K861 Other chronic pancreatitis: Secondary | ICD-10-CM

## 2022-03-31 DIAGNOSIS — K859 Acute pancreatitis without necrosis or infection, unspecified: Secondary | ICD-10-CM | POA: Diagnosis not present

## 2022-03-31 DIAGNOSIS — K869 Disease of pancreas, unspecified: Secondary | ICD-10-CM | POA: Diagnosis not present

## 2022-03-31 DIAGNOSIS — K2980 Duodenitis without bleeding: Secondary | ICD-10-CM | POA: Diagnosis not present

## 2022-03-31 DIAGNOSIS — K209 Esophagitis, unspecified without bleeding: Secondary | ICD-10-CM | POA: Diagnosis not present

## 2022-03-31 DIAGNOSIS — K838 Other specified diseases of biliary tract: Secondary | ICD-10-CM | POA: Diagnosis not present

## 2022-03-31 DIAGNOSIS — K828 Other specified diseases of gallbladder: Secondary | ICD-10-CM | POA: Diagnosis not present

## 2022-03-31 DIAGNOSIS — Z87891 Personal history of nicotine dependence: Secondary | ICD-10-CM | POA: Diagnosis not present

## 2022-03-31 DIAGNOSIS — K2970 Gastritis, unspecified, without bleeding: Secondary | ICD-10-CM | POA: Diagnosis not present

## 2022-03-31 DIAGNOSIS — Z7689 Persons encountering health services in other specified circumstances: Secondary | ICD-10-CM

## 2022-03-31 DIAGNOSIS — K222 Esophageal obstruction: Secondary | ICD-10-CM | POA: Diagnosis not present

## 2022-03-31 HISTORY — PX: BIOPSY: SHX5522

## 2022-03-31 HISTORY — PX: ESOPHAGOGASTRODUODENOSCOPY (EGD) WITH PROPOFOL: SHX5813

## 2022-03-31 HISTORY — PX: EUS: SHX5427

## 2022-03-31 LAB — PREGNANCY, URINE: Preg Test, Ur: NEGATIVE

## 2022-03-31 SURGERY — UPPER ENDOSCOPIC ULTRASOUND (EUS) LINEAR
Anesthesia: Monitor Anesthesia Care

## 2022-03-31 MED ORDER — LACTATED RINGERS IV SOLN: INTRAVENOUS | Status: DC | PRN

## 2022-03-31 MED ORDER — PROPOFOL 500 MG/50ML IV EMUL
INTRAVENOUS | Status: AC
  Filled 2022-03-31: qty 50

## 2022-03-31 MED ORDER — PROPOFOL 500 MG/50ML IV EMUL
INTRAVENOUS | Status: DC | PRN
  Administered 2022-03-31: 100 ug/kg/min via INTRAVENOUS

## 2022-03-31 MED ORDER — LIDOCAINE 2% (20 MG/ML) 5 ML SYRINGE
INTRAMUSCULAR | Status: DC | PRN
  Administered 2022-03-31: 100 mg via INTRAVENOUS

## 2022-03-31 MED ORDER — SODIUM CHLORIDE 0.9 % IV SOLN: INTRAVENOUS | Status: DC

## 2022-03-31 MED ORDER — DEXMEDETOMIDINE HCL IN NACL 80 MCG/20ML IV SOLN
INTRAVENOUS | Status: DC | PRN
  Administered 2022-03-31 (×2): 4 ug via BUCCAL

## 2022-03-31 MED ORDER — ONDANSETRON HCL 4 MG/2ML IJ SOLN
INTRAMUSCULAR | Status: DC | PRN
  Administered 2022-03-31: 4 mg via INTRAVENOUS

## 2022-03-31 MED ORDER — PROPOFOL 10 MG/ML IV BOLUS
INTRAVENOUS | Status: DC | PRN
  Administered 2022-03-31: 50 mg via INTRAVENOUS
  Administered 2022-03-31: 30 mg via INTRAVENOUS
  Administered 2022-03-31 (×2): 20 mg via INTRAVENOUS

## 2022-03-31 MED ORDER — PROPOFOL 500 MG/50ML IV EMUL
INTRAVENOUS | Status: AC
  Filled 2022-03-31: qty 100

## 2022-03-31 NOTE — Anesthesia Preprocedure Evaluation (Signed)
Anesthesia Evaluation  Patient identified by MRN, date of birth, ID band Patient awake    Reviewed: Allergy & Precautions, NPO status , Patient's Chart, lab work & pertinent test results  History of Anesthesia Complications Negative for: history of anesthetic complications  Airway Mallampati: I  TM Distance: >3 FB Neck ROM: Full    Dental  (+) Teeth Intact, Dental Advisory Given   Pulmonary neg shortness of breath, neg sleep apnea, neg COPD, neg recent URI, Patient abstained from smoking., former smoker   breath sounds clear to auscultation       Cardiovascular hypertension, Pt. on medications (-) angina (-) Past MI and (-) CHF  Rhythm:Regular     Neuro/Psych negative neurological ROS  negative psych ROS   GI/Hepatic negative GI ROS, Neg liver ROS,,,  Endo/Other  negative endocrine ROS    Renal/GU negative Renal ROS     Musculoskeletal negative musculoskeletal ROS (+)    Abdominal   Peds  Hematology negative hematology ROS (+)   Anesthesia Other Findings   Reproductive/Obstetrics Lab Results      Component                Value               Date                      PREGTESTUR               NEGATIVE            03/31/2022                HCG                      <5.0                01/11/2022                                        Anesthesia Physical Anesthesia Plan  ASA: 2  Anesthesia Plan:    Post-op Pain Management: Minimal or no pain anticipated   Induction: Intravenous  PONV Risk Score and Plan: 2 and Propofol infusion and Treatment may vary due to age or medical condition  Airway Management Planned: Nasal Cannula and Natural Airway  Additional Equipment: None  Intra-op Plan:   Post-operative Plan:   Informed Consent: I have reviewed the patients History and Physical, chart, labs and discussed the procedure including the risks, benefits and alternatives for the proposed  anesthesia with the patient or authorized representative who has indicated his/her understanding and acceptance.     Dental advisory given  Plan Discussed with: CRNA  Anesthesia Plan Comments:        Anesthesia Quick Evaluation

## 2022-03-31 NOTE — Transfer of Care (Signed)
Immediate Anesthesia Transfer of Care Note  Patient: Rorie K Belay  Procedure(s) Performed: Procedure(s): UPPER ENDOSCOPIC ULTRASOUND (EUS) LINEAR (N/A) BIOPSY ESOPHAGOGASTRODUODENOSCOPY (EGD) WITH PROPOFOL (N/A)  Patient Location: PACU  Anesthesia Type:MAC  Level of Consciousness: Patient easily awoken, sedated, comfortable, cooperative, following commands, responds to stimulation.   Airway & Oxygen Therapy: Patient spontaneously breathing, ventilating well, oxygen via simple oxygen mask.  Post-op Assessment: Report given to PACU RN, vital signs reviewed and stable, moving all extremities.   Post vital signs: Reviewed and stable.  Complications: No apparent anesthesia complications Last Vitals:  Vitals Value Taken Time  BP 153/79 03/31/22 0830  Temp    Pulse 74 03/31/22 0830  Resp 19 03/31/22 0830  SpO2 100 % 03/31/22 0830  Vitals shown include unvalidated device data.  Last Pain:  Vitals:   03/31/22 0714  TempSrc: Temporal  PainSc: 0-No pain         Complications: No notable events documented.

## 2022-03-31 NOTE — Anesthesia Procedure Notes (Signed)
Procedure Name: LMA Insertion Date/Time: 03/31/2022 7:50 AM  Performed by: Ludwig Lean, CRNAPre-anesthesia Checklist: Patient identified, Emergency Drugs available, Suction available and Patient being monitored Patient Re-evaluated:Patient Re-evaluated prior to induction Oxygen Delivery Method: Simple face mask Preoxygenation: Pre-oxygenation with 100% oxygen Placement Confirmation: positive ETCO2 and breath sounds checked- equal and bilateral

## 2022-03-31 NOTE — Anesthesia Postprocedure Evaluation (Signed)
Anesthesia Post Note  Patient: Renee [name]  Procedure(s) Performed: UPPER ENDOSCOPIC ULTRASOUND (EUS) LINEAR BIOPSY ESOPHAGOGASTRODUODENOSCOPY (EGD) WITH PROPOFOL     Patient location during evaluation: Endoscopy Anesthesia Type: MAC Level of consciousness: awake and alert Pain management: pain level controlled Vital Signs Assessment: post-procedure vital signs reviewed and stable Respiratory status: spontaneous breathing, nonlabored ventilation and respiratory function stable Cardiovascular status: stable and blood pressure returned to baseline Postop Assessment: no apparent nausea or vomiting Anesthetic complications: no  No notable events documented.  Last Vitals:  Vitals:   03/31/22 0840 03/31/22 0850  BP: (!) 158/109 (!) 163/99  Pulse: 71 67  Resp: 17 17  Temp:    SpO2: 99% 95%    Last Pain:  Vitals:   03/31/22 0850  TempSrc:   PainSc: 0-No pain                 Tiarah Shisler

## 2022-03-31 NOTE — Discharge Instructions (Signed)
YOU HAD AN ENDOSCOPIC PROCEDURE TODAY: Refer to the procedure report and other information in the discharge instructions given to you for any specific questions about what was found during the examination. If this information does not answer your questions, please call [place] office at [phone] to clarify.  ° °YOU SHOULD EXPECT: Some feelings of bloating in the abdomen. Passage of more gas than usual. Walking can help get rid of the air that was put into your GI tract during the procedure and reduce the bloating. If you had a lower endoscopy (such as a colonoscopy or flexible sigmoidoscopy) you may notice spotting of blood in your stool or on the toilet paper. Some abdominal soreness may be present for a day or two, also. ° °DIET: Your first meal following the procedure should be a light meal and then it is ok to progress to your normal diet. A half-sandwich or bowl of soup is an example of a good first meal. Heavy or fried foods are harder to digest and may make you feel nauseous or bloated. Drink plenty of fluids but you should avoid alcoholic beverages for 24 hours. If you had a esophageal dilation, please see attached instructions for diet.   ° °ACTIVITY: Your care partner should take you home directly after the procedure. You should plan to take it easy, moving slowly for the rest of the day. You can resume normal activity the day after the procedure however YOU SHOULD NOT DRIVE, use power tools, machinery or perform tasks that involve climbing or major physical exertion for 24 hours (because of the sedation medicines used during the test).  ° °SYMPTOMS TO REPORT IMMEDIATELY: °A gastroenterologist can be reached at any hour. Please call [phone]  for any of the following symptoms:  °Following lower endoscopy (colonoscopy, flexible sigmoidoscopy) °Excessive amounts of blood in the stool  °Significant tenderness, worsening of abdominal pains  °Swelling of the abdomen that is new, acute  °Fever of 100° or  higher  °Following upper endoscopy (EGD, EUS, ERCP, esophageal dilation) °Vomiting of blood or coffee ground material  °New, significant abdominal pain  °New, significant chest pain or pain under the shoulder blades  °Painful or persistently difficult swallowing  °New shortness of breath  °Black, tarry-looking or red, bloody stools ° °FOLLOW UP:  °If any biopsies were taken you will be contacted by phone or by letter within the next 1-3 weeks. Call [phone]  if you have not heard about the biopsies in 3 weeks.  °Please also call with any specific questions about appointments or follow up tests. ° °

## 2022-03-31 NOTE — H&P (Signed)
GASTROENTEROLOGY PROCEDURE H&P NOTE   Primary Care Physician: Billie Lade, MD  HPI: Renee [name] is a 41 y.o. female who presents for EGD/EUS to evaluate dilated PD, rule out stone, rule out mass, rule out chronic pancreatitis in the setting of hypercalcemia of hyperparathyroidism.  Past Medical History:  Diagnosis Date   Elevated serum creatinine 02/19/2021   Herpes simplex virus (HSV) infection    no OB +HSV 05/04/16 at [facility]    Hypertension    Hypothyroidism    Vaginal Pap smear, abnormal    Past Surgical History:  Procedure Laterality Date   CHOLECYSTECTOMY N/A 04/13/2020   Procedure: LAPAROSCOPIC CHOLECYSTECTOMY WITH INTRAOPERATIVE CHOLANGIOGRAM;  Surgeon: Darnell Level, MD;  Location: [facility];  Service: General;  Laterality: N/A;   KNEE SURGERY     TONSILLECTOMY     Current Facility-Administered Medications  Medication Dose Route Frequency Provider Last Rate Last Admin   0.9 %  sodium chloride infusion   Intravenous Continuous [name], [name], MD        Current Facility-Administered Medications:    0.9 %  sodium chloride infusion, , Intravenous, Continuous, [name], [name], MD No Known Allergies Family History  Problem Relation Age of Onset   Congestive Heart Failure Maternal Grandmother    Hypertension Maternal Grandmother    Diabetes Maternal Grandmother    Other Maternal Grandfather        aneursym   Kidney disease Maternal Grandfather        on dialysis   Congestive Heart Failure Maternal Grandfather    Diabetes Maternal Grandfather    Social History   Socioeconomic History   Marital status: Single    Spouse name: Not on file   Number of children: Not on file   Years of education: Not on file   Highest education level: Not on file  Occupational History   Not on file  Tobacco Use   Smoking status: Former    Packs/day: 0.25    Years: 17.00    Total pack years: 4.25    Types: Cigarettes    Quit date: 01/09/2022    Years since  quitting: 0.2   Smokeless tobacco: Never   Tobacco comments:    smokes 2-3 cig daily  Vaping Use   Vaping Use: Never used  Substance and Sexual Activity   Alcohol use: Not Currently    Comment: socially   Drug use: Yes    Types: Marijuana    Comment: occasionally   Sexual activity: Yes    Birth control/protection: None  Other Topics Concern   Not on file  Social History Narrative   Not on file   Social Determinants of Health   Financial Resource Strain: Medium Risk (02/16/2021)   Overall Financial Resource Strain (CARDIA)    Difficulty of Paying Living Expenses: Somewhat hard  Food Insecurity: No Food Insecurity (01/11/2022)   Hunger Vital Sign    Worried About Running Out of Food in the Last Year: Never true    Ran Out of Food in the Last Year: Never true  Transportation Needs: No Transportation Needs (01/11/2022)   PRAPARE - Administrator, Civil Service (Medical): No    Lack of Transportation (Non-Medical): No  Physical Activity: Inactive (02/16/2021)   Exercise Vital Sign    Days of Exercise per Week: 0 days    Minutes of Exercise per Session: 0 min  Stress: No Stress Concern Present (02/16/2021)   [employer] of Occupational Health - Occupational Stress  Questionnaire    Feeling of Stress : Only a little  Social Connections: Moderately Isolated (02/16/2021)   Social Connection and Isolation Panel [NHANES]    Frequency of Communication with Friends and Family: Three times a week    Frequency of Social Gatherings with Friends and Family: Twice a week    Attends Religious Services: 1 to 4 times per year    Active Member of Clubs or Organizations: No    Attends Archivist Meetings: Never    Marital Status: Divorced  Human resources officer Violence: Not At Risk (01/11/2022)   Humiliation, Afraid, Rape, and Kick questionnaire    Fear of Current or Ex-Partner: No    Emotionally Abused: No    Physically Abused: No    Sexually Abused: No    Physical  Exam: Today's Vitals   03/24/22 1533 03/31/22 0714  BP:  (!) 163/95  Pulse:  73  Resp:  16  Temp:  98.1 F (36.7 C)  TempSrc:  Temporal  SpO2:  100%  Weight: 114 kg 114 kg  Height:  5' 0.98" (1.549 m)  PainSc:  0-No pain   Body mass index is 47.51 kg/m. GEN: NAD EYE: Sclerae anicteric ENT: MMM CV: Non-tachycardic GI: Soft, NT/ND NEURO:  Alert & Oriented x 3  Lab Results: No results for input(s): "WBC", "HGB", "HCT", "PLT" in the last 72 hours. BMET No results for input(s): "NA", "K", "CL", "CO2", "GLUCOSE", "BUN", "CREATININE", "CALCIUM" in the last 72 hours. LFT No results for input(s): "PROT", "ALBUMIN", "AST", "ALT", "ALKPHOS", "BILITOT", "BILIDIR", "IBILI" in the last 72 hours. PT/INR No results for input(s): "LABPROT", "INR" in the last 72 hours.   Impression / Plan: This is a 41 y.o.female who presents for EGD/EUS to evaluate dilated PD, rule out stone, rule out mass, rule out chronic pancreatitis in the setting of hypercalcemia of hyperparathyroidism.  The risks of an EUS including intestinal perforation, bleeding, infection, aspiration, and medication effects were discussed as was the possibility it may not give a definitive diagnosis if a biopsy is performed.  When a biopsy of the pancreas is done as part of the EUS, there is an additional risk of pancreatitis at the rate of about 1-2%.  It was explained that procedure related pancreatitis is typically mild, although it can be severe and even life threatening, which is why we do not perform random pancreatic biopsies and only biopsy a lesion/area we feel is concerning enough to warrant the risk.  The risks and benefits of endoscopic evaluation/treatment were discussed with the patient and/or family; these include but are not limited to the risk of perforation, infection, bleeding, missed lesions, lack of diagnosis, severe illness requiring hospitalization, as well as anesthesia and sedation related illnesses.  The  patient's history has been reviewed, patient examined, no change in status, and deemed stable for procedure.  The patient and/or family is agreeable to proceed.    Justice Britain, MD [facility] Office # [phone]

## 2022-03-31 NOTE — Op Note (Signed)
[facility] Patient Name: Renee [name] Procedure Date: 03/31/2022 MRN: [id number] Attending MD: Justice Britain , MD, [id number] Date of Birth: 31-Jul-1980 CSN: [id number] Age: 41 Admit Type: Outpatient Procedure:                Upper EUS Indications:              Dilated pancreatic duct on CT scan, Dilated                            pancreatic duct on MRCP, Exclusion of chronic                            pancreatitis, Acute recurrent pancreatitis Providers:                Justice Britain, MD, Fanny Skates RN, RN, Carlyn Reichert, RN, Gloris Ham, Technician Referring MD:             Elon Alas. Abbey Chatters, DO, Byars Dixon Medicines:                Monitored Anesthesia Care Complications:            No immediate complications. Estimated Blood Loss:     Estimated blood loss was minimal. Procedure:                Pre-Anesthesia Assessment:                           - Prior to the procedure, a History and Physical                            was performed, and patient medications and                            allergies were reviewed. The patient's tolerance of                            previous anesthesia was also reviewed. The risks                            and benefits of the procedure and the sedation                            options and risks were discussed with the patient.                            All questions were answered, and informed consent                            was obtained. Prior Anticoagulants: The patient has                            taken no anticoagulant or antiplatelet agents. ASA  Grade Assessment: II - A patient with mild systemic                            disease. After reviewing the risks and benefits,                            the patient was deemed in satisfactory condition to                            undergo the procedure.                           After obtaining  informed consent, the endoscope was                            passed under direct vision. Throughout the                            procedure, the patient's blood pressure, pulse, and                            oxygen saturations were monitored continuously. The                            [id number]-[id number] ([id number]) Olympus endoscope was introduced                            through the mouth, and advanced to the second part                            of duodenum. The [id number]-[id number] ([id number]) Olympus                            duodenoscope was introduced through the mouth, and                            advanced to the area of papilla. The [id number]                            ([id number]) Olympus linear ultrasound scope was                            introduced through the mouth, and advanced to the                            duodenum for ultrasound examination from the                            stomach and duodenum. The upper EUS was                            accomplished without difficulty. The patient  tolerated the procedure. Scope In: Scope Out: Findings:      ENDOSCOPIC FINDING: :      No gross lesions were noted in the majority of the esophagus.      LA Grade A (one or more mucosal breaks less than 5 mm, not extending       between tops of 2 mucosal folds) esophagitis with no bleeding was found       at the very distal esophagus and at the gastroesophageal junction.      The Z-line was irregular and was found 36 cm from the incisors.      Patchy mild inflammation characterized by erosions and erythema was       found in the entire examined stomach. Biopsies were taken with a cold       forceps for histology and Helicobacter pylori testing.      Patchy mild inflammation characterized by erosions and erythema was       found in the duodenal bulb, in the first portion of the duodenum and in       the second portion of the duodenum. Biopsies were taken with a cold        forceps for histology and Helicobacter pylori testing.      The major papilla was normal.      ENDOSONOGRAPHIC FINDING: :      Within the pancreatic body and tail, the pancreatic duct had a dilated       endosonographic appearance, had intraductal stone (7.7 mm x 6.3 mm), had       a prominently branched endosonographic appearance, had a       tortuous/ectatic appearance and had hyperechoic walls. PD body 6.6 mm in       measurement, PD tail 5.3 mm -> 2.2 mm in measurement. The pancreas duct       in the head and neck were normal in appearance (PD head = 1.3 mm, PD       neck = 1.1 mm).      Pancreatic parenchymal abnormalities were noted in the pancreatic head,       genu of the pancreas and uncinate process of the pancreas. These       consisted of lobularity without honeycombing and hyperechoic strands.       The pancreas parenchyma upstream of the after mentioned intraductal       stone is quite atrophic however.      There was no sign of significant endosonographic abnormality in the       common bile duct (3.9 mm mm) and in the common hepatic duct (6.1 mm).       Ducts with regular contour were identified.      Endosonographic imaging of the ampulla showed no intramural       (subepithelial) lesion.      One enlarged lymph node was visualized in the porta hepatis region. It       measured 13 mm by 9 mm in maximal cross-sectional diameter. The node was       triangular, isoechoic and had well defined margins.      Endosonographic imaging in the visualized portion of the liver showed no       mass.      The celiac region was visualized. Impression:               EGD impression:                           -  No gross lesions in the majority of the esophagus.                           - LA Grade A esophagitis with no bleeding found                            distally and at GE junction.                           - Z-line irregular, 36 cm from the incisors.                           -  Gastritis. Biopsied.                           - Duodenitis. Biopsied.                           - Normal major papilla.                           EUS impression:                           - The pancreatic duct within the body and tail                            regions had a dilated endosonographic appearance,                            had intraductal stone, had a prominently branched                            endosonographic appearance, had a tortuous/ectatic                            appearance and had hyperechoic walls. The pancreas                            duct within the head and neck are normal in                            appearance and size.                           - Pancreatic parenchymal abnormalities consisting                            of lobularity and hyperechoic strands were noted in                            the pancreatic head, genu of the pancreas and                            uncinate process of the pancreas. There was atrophy  in the upstream pancreatic body/tail area from the                            aforementioned stone.                           - There was no sign of significant pathology in the                            common bile duct and in the common hepatic duct.                           - One enlarged lymph node was visualized in the                            porta hepatis region. Tissue has not been obtained.                            However, the endosonographic appearance is                            consistent with benign inflammatory changes. Moderate Sedation:      Not Applicable - Patient had care per Anesthesia. Recommendation:           - The patient will be observed post-procedure,                            until all discharge criteria are met.                           - Discharge patient to home.                           - Patient has a contact number available for                             emergencies. The signs and symptoms of potential                            delayed complications were discussed with the                            patient. Return to normal activities tomorrow.                            Written discharge instructions were provided to the                            patient.                           - Low fat diet.                           - Observe patient's clinical course.                           -  Await path results.                           - Reasonable for patient to follow-up with surgery                            in regards to consideration of parathyroidectomy                            for etiology of hypercalcemia that is likely cause                            of her recurrent bouts of pancreatitis and signs                            consistent with chronic pancreatitis.                           - I am willing and happy to meet the patient in                            clinic to discuss consideration of pancreatic ERCP                            for attempt at PD stenting and attempt at stone                            extraction although with the patient's normal PD                            throughout the head and neck area, extraction will                            be difficult.                           - Minimize any alcohol consumption (not a                            significant alcohol consumer at current status or                            in past).                           - The findings and recommendations were discussed                            with the patient. Procedure Code(s):        --- Professional ---                           [phone], Esophagogastroduodenoscopy, flexible,  transoral; with endoscopic ultrasound examination                            limited to the esophagus, stomach or duodenum, and                            adjacent structures                           43239,  Esophagogastroduodenoscopy, flexible,                            transoral; with biopsy, single or multiple Diagnosis Code(s):        --- Professional ---                           K20.90, Esophagitis, unspecified without bleeding                           K22.89, Other specified disease of esophagus                           K29.70, Gastritis, unspecified, without bleeding                           K29.80, Duodenitis without bleeding                           K86.89, Other specified diseases of pancreas                           R93.3, Abnormal findings on diagnostic imaging of                            other parts of digestive tract                           K86.9, Disease of pancreas, unspecified                           R59.0, Localized enlarged lymph nodes                           K85.90, Acute pancreatitis without necrosis or                            infection, unspecified CPT copyright 2022 [employer]. All rights reserved. The codes documented in this report are preliminary and upon coder review may  be revised to meet current compliance requirements. Justice Britain, MD 03/31/2022 8:52:41 AM Number of Addenda: 0

## 2022-04-01 LAB — SURGICAL PATHOLOGY

## 2022-04-04 ENCOUNTER — Encounter (HOSPITAL_COMMUNITY): Payer: Self-pay | Admitting: Gastroenterology

## 2022-04-06 ENCOUNTER — Encounter (HOSPITAL_COMMUNITY): Payer: Self-pay

## 2022-04-06 ENCOUNTER — Ambulatory Visit (INDEPENDENT_AMBULATORY_CARE_PROVIDER_SITE_OTHER): Payer: BC Managed Care – PPO | Admitting: Clinical

## 2022-04-06 ENCOUNTER — Telehealth: Payer: Self-pay | Admitting: *Deleted

## 2022-04-06 DIAGNOSIS — F331 Major depressive disorder, recurrent, moderate: Secondary | ICD-10-CM

## 2022-04-06 DIAGNOSIS — F419 Anxiety disorder, unspecified: Secondary | ICD-10-CM | POA: Diagnosis not present

## 2022-04-06 DIAGNOSIS — F4310 Post-traumatic stress disorder, unspecified: Secondary | ICD-10-CM
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
## 2022-04-06 NOTE — Telephone Encounter (Signed)
This was ordered by [facility]. Will route to Dr. Dion Saucier nurse to address.

## 2022-04-08 ENCOUNTER — Other Ambulatory Visit: Payer: Self-pay

## 2022-04-08 ENCOUNTER — Encounter: Payer: Self-pay | Admitting: Gastroenterology

## 2022-04-08 MED ORDER — OMEPRAZOLE 20 MG PO CPDR: 20.0000 mg | DELAYED_RELEASE_CAPSULE | Freq: Every day | ORAL | 6 refills | Status: DC

## 2022-04-10 ENCOUNTER — Telehealth: Payer: Self-pay | Admitting: Gastroenterology

## 2022-04-10 NOTE — Telephone Encounter (Signed)
Patient recently completed EUS with Dr. Meridee Score with plans for follow up with him for possible ERCP. She also had gastritis and reflux esophagitis.   Please make a follow up ov here in 3-4 months.  She will need to also follow up with Dr. Meridee Score per his directions.

## 2022-04-12 ENCOUNTER — Other Ambulatory Visit: Payer: Self-pay

## 2022-04-12 DIAGNOSIS — Z9189 Other specified personal risk factors, not elsewhere classified: Secondary | ICD-10-CM

## 2022-04-14 ENCOUNTER — Ambulatory Visit (INDEPENDENT_AMBULATORY_CARE_PROVIDER_SITE_OTHER): Payer: BC Managed Care – PPO | Admitting: Internal Medicine

## 2022-04-14 ENCOUNTER — Encounter: Payer: Self-pay | Admitting: Internal Medicine

## 2022-04-14 VITALS — BP 136/98 | HR 82 | Ht 61.0 in | Wt 243.8 lb

## 2022-04-14 DIAGNOSIS — Z9189 Other specified personal risk factors, not elsewhere classified: Secondary | ICD-10-CM | POA: Diagnosis not present

## 2022-04-14 DIAGNOSIS — E210 Primary hyperparathyroidism: Secondary | ICD-10-CM | POA: Diagnosis not present

## 2022-04-14 DIAGNOSIS — F419 Anxiety disorder, unspecified: Secondary | ICD-10-CM | POA: Diagnosis not present

## 2022-04-14 DIAGNOSIS — K859 Acute pancreatitis without necrosis or infection, unspecified: Secondary | ICD-10-CM | POA: Diagnosis not present

## 2022-04-14 DIAGNOSIS — F32A Depression, unspecified: Secondary | ICD-10-CM

## 2022-04-14 DIAGNOSIS — K861 Other chronic pancreatitis: Secondary | ICD-10-CM

## 2022-04-14 MED ORDER — SERTRALINE HCL 50 MG PO TABS: 50.0000 mg | ORAL_TABLET | Freq: Every day | ORAL | 3 refills | Status: DC

## 2022-04-14 MED ORDER — HYDROXYZINE HCL 10 MG PO TABS: 10.0000 mg | ORAL_TABLET | Freq: Three times a day (TID) | ORAL | 0 refills | Status: DC | PRN

## 2022-04-14 NOTE — Progress Notes (Signed)
Established Patient Office Visit  Subjective   Patient ID: Renee [name], female    DOB: [birth year]  Age: 42 y.o. MRN: [id number]  Chief Complaint  Patient presents with   Hypertension    Follow up   Sleep Apnea   Renee [name] returns to care today.  She was last seen by me on 02/10/22 at which time no medication adjustments were made.  In the interim she presented to the emergency department on 11/8 endorsing nausea she was treated with supportive care measures and recommended to follow-up with GI.  She was presented to the emergency department on 12/15 endorsing URI symptoms.  Flu testing was negative.  She was treated with supportive care measures and discharged home.  She then underwent EGD/EUS on 03/31/22.  She will follow-up to discuss possible ERCP gastritis and reflux esophagitis were also noted.  She has also been seen by general surgery on 12/6 to discuss parathyroidectomy.  Today Renee [name] endorses significant anxiety and depression.  Her PHQ-9 and GAD-7 scores are significantly elevated.  He was previously referred to psychiatry and has undergone assessment but does not have a counseling appointment scheduled until later this month.  Became tearful when describing significant life stress that has occurred recently.  Denies SI/HI currently.  She reports that she has to pay $500 before parathyroidectomy will be scheduled.  She hopes that this will be accomplished over the next few weeks.  Past Medical History:  Diagnosis Date   Elevated serum creatinine 02/19/2021   Herpes simplex virus (HSV) infection    no OB +HSV 05/04/16 at [facility]    Hypertension    Hypothyroidism    Vaginal Pap smear, abnormal    Past Surgical History:  Procedure Laterality Date   BIOPSY  03/31/2022   Procedure: BIOPSY;  Surgeon: Irving Copas., MD;  Location: [facility];  Service: Gastroenterology;;   CHOLECYSTECTOMY N/A 04/13/2020   Procedure: LAPAROSCOPIC CHOLECYSTECTOMY WITH INTRAOPERATIVE  CHOLANGIOGRAM;  Surgeon: Armandina Gemma, MD;  Location: [facility];  Service: General;  Laterality: N/A;   ESOPHAGOGASTRODUODENOSCOPY (EGD) WITH PROPOFOL N/A 03/31/2022   Procedure: ESOPHAGOGASTRODUODENOSCOPY (EGD) WITH PROPOFOL;  Surgeon: Irving Copas., MD;  Location: [facility];  Service: Gastroenterology;  Laterality: N/A;   EUS N/A 03/31/2022   Procedure: UPPER ENDOSCOPIC ULTRASOUND (EUS) LINEAR;  Surgeon: Irving Copas., MD;  Location: [facility];  Service: Gastroenterology;  Laterality: N/A;   KNEE SURGERY     TONSILLECTOMY     Social History   Tobacco Use   Smoking status: Former    Packs/day: 0.25    Years: 17.00    Total pack years: 4.25    Types: Cigarettes    Quit date: 01/09/2022    Years since quitting: 0.2   Smokeless tobacco: Never   Tobacco comments:    smokes 2-3 cig daily  Vaping Use   Vaping Use: Never used  Substance Use Topics   Alcohol use: Not Currently    Comment: socially   Drug use: Yes    Types: Marijuana    Comment: occasionally   Family History  Problem Relation Age of Onset   Congestive Heart Failure Maternal Grandmother    Hypertension Maternal Grandmother    Diabetes Maternal Grandmother    Other Maternal Grandfather        aneursym   Kidney disease Maternal Grandfather        on dialysis   Congestive Heart Failure Maternal Grandfather    Diabetes Maternal Grandfather    No  Known Allergies    Review of Systems  Psychiatric/Behavioral:  Positive for depression. Negative for suicidal ideas. The patient is nervous/anxious.   All other systems reviewed and are negative.     Objective:     BP (!) 136/98   Pulse 82   Ht _0  (1.549 m)   Wt 243 lb 12.8 oz (110.6 kg)   LMP 03/24/2022 (Exact Date)   SpO2 99%   BMI 46.07 kg/m  BP Readings from Last 3 Encounters:  04/14/22 (!) 136/98  03/31/22 (!) 163/99  03/25/22 (!) 142/92      Physical Exam Vitals reviewed.  Constitutional:      General: She is not in  acute distress.    Appearance: Normal appearance. She is obese. She is not toxic-appearing.  HENT:     Head: Normocephalic and atraumatic.     Right Ear: External ear normal.     Left Ear: External ear normal.     Nose: Nose normal. No congestion or rhinorrhea.     Mouth/Throat:     Mouth: Mucous membranes are moist.     Pharynx: Oropharynx is clear. No oropharyngeal exudate or posterior oropharyngeal erythema.  Eyes:     General: No scleral icterus.    Extraocular Movements: Extraocular movements intact.     Conjunctiva/sclera: Conjunctivae normal.     Pupils: Pupils are equal, round, and reactive to light.  Cardiovascular:     Rate and Rhythm: Normal rate and regular rhythm.     Pulses: Normal pulses.     Heart sounds: Normal heart sounds. No murmur heard.    No friction rub. No gallop.  Pulmonary:     Effort: Pulmonary effort is normal.     Breath sounds: Normal breath sounds. No wheezing, rhonchi or rales.  Abdominal:     General: Abdomen is flat. Bowel sounds are normal. There is no distension.     Palpations: Abdomen is soft.     Tenderness: There is no abdominal tenderness.  Musculoskeletal:        General: No swelling. Normal range of motion.     Cervical back: Normal range of motion.     Right lower leg: No edema.     Left lower leg: No edema.  Lymphadenopathy:     Cervical: No cervical adenopathy.  Skin:    General: Skin is warm and dry.     Capillary Refill: Capillary refill takes less than 2 seconds.     Coloration: Skin is not jaundiced.  Neurological:     General: No focal deficit present.     Mental Status: She is alert and oriented to person, place, and time.  Psychiatric:     Comments: Tearful during encounter    Last CBC Lab Results  Component Value Date   WBC 6.1 01/21/2022   HGB 12.4 01/21/2022   HCT 39.0 01/21/2022   MCV 88 01/21/2022   MCH 27.9 01/21/2022   RDW 12.5 01/21/2022   PLT 300 73/71/0626   Last metabolic panel Lab Results   Component Value Date   GLUCOSE 109 (H) 02/16/2022   NA 140 02/16/2022   K 4.6 02/16/2022   CL 108 (H) 02/16/2022   CO2 20 02/16/2022   BUN 9 02/16/2022   CREATININE 0.83 02/16/2022   EGFR 91 02/16/2022   CALCIUM 12.4 (H) 02/16/2022   PHOS 2.0 (L) 01/12/2022   PROT 7.4 02/16/2022   ALBUMIN 4.4 02/16/2022   LABGLOB 3.0 02/16/2022   AGRATIO 1.5 02/16/2022  BILITOT 0.2 02/16/2022   ALKPHOS 113 02/16/2022   AST 14 02/16/2022   ALT 11 02/16/2022   ANIONGAP 5 01/14/2022   Last lipids Lab Results  Component Value Date   CHOL 128 01/12/2022   HDL 48 01/12/2022   LDLCALC 67 01/12/2022   TRIG 66 01/12/2022   CHOLHDL 2.7 01/12/2022   Last hemoglobin A1c Lab Results  Component Value Date   HGBA1C 5.6 01/21/2022   Last thyroid functions Lab Results  Component Value Date   TSH 1.900 01/21/2022   Last vitamin D Lab Results  Component Value Date   VD25OH 25.4 (L) 01/21/2022   Last vitamin B12 and Folate No results found for: "VITAMINB12", "FOLATE"    The ASCVD Risk score (Arnett DK, et al., 2019) failed to calculate for the following reasons:   The valid total cholesterol range is 130 to 320 mg/dL    Assessment & Plan:   Problem List Items Addressed This Visit       Acute on chronic pancreatitis ([place])    Followed by GI.  Recently underwent EGD/EUS plan to follow-up next 3-4 months to discuss ERCP with stent placement.  Parathyroidectomy surgery is pending.      Hyperparathyroidism, primary ([place]) - Primary    As previously noted, this is the likely underlying cause of her pancreatitis.  She was seen by surgery on 12/6 to discuss parathyroidectomy.  Ms. Lasecki reports that this has not been scheduled yet because she needs to pay $500 before scheduling.      Risk factors for obstructive sleep apnea    Previously referred for PSG.  States that they called her recently to schedule an appointment and that she needs to return the call to reschedule.      Anxiety and  depression    Tearful during today's encounter.  Her PHQ-9 and GAD-7 scores are significantly elevated.  She was previously referred to psychiatry and has an appointment with counseling scheduled for later this month.  Today she expresses a desire to start medication for treatment of anxiety and depression.  She notes that she has had multiple panic attacks in recent days.  Currently denies SI/HI. -Start sertraline 50 mg daily -Hydroxyzine has been prescribed for as needed use for anxiety relief -Follow-up in 4 weeks for reassessment       Return in about 4 weeks (around 05/12/2022).    Johnette Abraham, MD

## 2022-04-14 NOTE — Assessment & Plan Note (Signed)
Previously referred for PSG.  States that they called her recently to schedule an appointment and that she needs to return the call to reschedule.

## 2022-04-14 NOTE — Assessment & Plan Note (Signed)
Tearful during today's encounter.  Her PHQ-9 and GAD-7 scores are significantly elevated.  She was previously referred to psychiatry and has an appointment with counseling scheduled for later this month.  Today she expresses a desire to start medication for treatment of anxiety and depression.  She notes that she has had multiple panic attacks in recent days.  Currently denies SI/HI. -Start sertraline 50 mg daily -Hydroxyzine has been prescribed for as needed use for anxiety relief -Follow-up in 4 weeks for reassessment

## 2022-04-14 NOTE — Assessment & Plan Note (Addendum)
Followed by GI.  Recently underwent EGD/EUS plan to follow-up next 3-4 months to discuss ERCP with stent placement.  Parathyroidectomy surgery is pending.

## 2022-04-14 NOTE — Assessment & Plan Note (Signed)
As previously noted, this is the likely underlying cause of her pancreatitis.  She was seen by surgery on 12/6 to discuss parathyroidectomy.  Renee [name] reports that this has not been scheduled yet because she needs to pay $500 before scheduling.

## 2022-04-14 NOTE — Patient Instructions (Signed)
It was a pleasure to see you today.  Thank you for giving [place] the opportunity to be involved in your care.  Below is a brief recap of your visit and next steps.  We will plan to see you again in 4 weeks.  Summary Star sertraline 50 mg daily for anxiety and depression I have also prescribed hydroxyzine for as needed anxiety relief We will follow up in 1 month

## 2022-04-18 ENCOUNTER — Emergency Department (HOSPITAL_COMMUNITY)
Admission: EM | Admit: 2022-04-18 | Discharge: 2022-04-19 | Disposition: A | Discharge status: Home / Self Care | Payer: BC Managed Care – PPO | Attending: Emergency Medicine | Admitting: Emergency Medicine

## 2022-04-18 ENCOUNTER — Other Ambulatory Visit: Payer: Self-pay

## 2022-04-18 ENCOUNTER — Emergency Department (HOSPITAL_COMMUNITY)
Admission: EM | Admit: 2022-04-18 | Discharge: 2022-04-18 | Discharge status: Against Medical Advice | Payer: BC Managed Care – PPO | Source: Home / Self Care

## 2022-04-18 ENCOUNTER — Encounter (HOSPITAL_COMMUNITY): Payer: Self-pay | Admitting: Emergency Medicine

## 2022-04-18 ENCOUNTER — Ambulatory Visit: Payer: BC Managed Care – PPO | Admitting: Adult Health

## 2022-04-18 DIAGNOSIS — N83201 Unspecified ovarian cyst, right side: Secondary | ICD-10-CM | POA: Diagnosis not present

## 2022-04-18 DIAGNOSIS — N200 Calculus of kidney: Secondary | ICD-10-CM | POA: Diagnosis not present

## 2022-04-18 DIAGNOSIS — K8689 Other specified diseases of pancreas: Secondary | ICD-10-CM | POA: Diagnosis not present

## 2022-04-18 DIAGNOSIS — N839 Noninflammatory disorder of ovary, fallopian tube and broad ligament, unspecified: Secondary | ICD-10-CM | POA: Diagnosis not present

## 2022-04-18 DIAGNOSIS — R1010 Upper abdominal pain, unspecified: Secondary | ICD-10-CM | POA: Diagnosis not present

## 2022-04-18 DIAGNOSIS — E279 Disorder of adrenal gland, unspecified: Secondary | ICD-10-CM | POA: Diagnosis not present

## 2022-04-18 LAB — COMPREHENSIVE METABOLIC PANEL
ALT: 17 U/L (ref 0–44)
AST: 18 U/L (ref 15–41)
Albumin: 4.1 g/dL (ref 3.5–5.0)
Alkaline Phosphatase: 70 U/L (ref 38–126)
Anion gap: 8 (ref 5–15)
BUN: 12 mg/dL (ref 6–20)
CO2: 20 mmol/L — ABNORMAL LOW (ref 22–32)
Calcium: 10.7 mg/dL — ABNORMAL HIGH (ref 8.9–10.3)
Chloride: 107 mmol/L (ref 98–111)
Creatinine, Ser: 0.78 mg/dL (ref 0.44–1.00)
GFR, Estimated: >60 mL/min (ref >60)
Glucose, Bld: 100 mg/dL — ABNORMAL HIGH (ref 70–99)
Potassium: 4.2 mmol/L (ref 3.5–5.1)
Sodium: 135 mmol/L (ref 135–145)
Total Bilirubin: 0.4 mg/dL (ref 0.3–1.2)
Total Protein: 7.6 g/dL (ref 6.5–8.1)

## 2022-04-18 LAB — CBC
HCT: 42.2 % (ref 36.0–46.0)
Hemoglobin: 13.7 g/dL (ref 12.0–15.0)
MCH: 28.4 pg (ref 26.0–34.0)
MCHC: 32.5 g/dL (ref 30.0–36.0)
MCV: 87.4 fL (ref 80.0–100.0)
Platelets: 291 10*3/uL (ref 150–400)
RBC: 4.83 MIL/uL (ref 3.87–5.11)
RDW: 13.2 % (ref 11.5–15.5)
WBC: 8.9 10*3/uL (ref 4.0–10.5)
nRBC: 0 % (ref 0.0–0.2)

## 2022-04-18 LAB — LIPASE, BLOOD: Lipase: 31 U/L (ref 11–51)

## 2022-04-18 NOTE — ED Triage Notes (Signed)
Pt c/o abd/back pain for a couple of day. Pt states she believes her pancreatitis is acting up.

## 2022-04-19 ENCOUNTER — Emergency Department (HOSPITAL_COMMUNITY): Payer: BC Managed Care – PPO

## 2022-04-19 DIAGNOSIS — N839 Noninflammatory disorder of ovary, fallopian tube and broad ligament, unspecified: Secondary | ICD-10-CM | POA: Diagnosis not present

## 2022-04-19 DIAGNOSIS — E279 Disorder of adrenal gland, unspecified: Secondary | ICD-10-CM | POA: Diagnosis not present

## 2022-04-19 DIAGNOSIS — K8689 Other specified diseases of pancreas: Secondary | ICD-10-CM | POA: Diagnosis not present

## 2022-04-19 DIAGNOSIS — N200 Calculus of kidney: Secondary | ICD-10-CM | POA: Diagnosis not present

## 2022-04-19 LAB — URINALYSIS, ROUTINE W REFLEX MICROSCOPIC
Bilirubin Urine: NEGATIVE
Glucose, UA: NEGATIVE mg/dL
Hgb urine dipstick: NEGATIVE
Ketones, ur: NEGATIVE mg/dL
Nitrite: NEGATIVE
Protein, ur: NEGATIVE mg/dL
Specific Gravity, Urine: 1.02 (ref 1.005–1.030)
pH: 5 (ref 5.0–8.0)

## 2022-04-19 LAB — POC URINE PREG, ED: Preg Test, Ur: NEGATIVE

## 2022-04-19 MED ORDER — ONDANSETRON HCL 4 MG PO TABS: 4.0000 mg | ORAL_TABLET | Freq: Four times a day (QID) | ORAL | 0 refills | Status: DC | PRN

## 2022-04-19 MED ORDER — ONDANSETRON HCL 4 MG/2ML IJ SOLN
4.0000 mg | Freq: Once | INTRAMUSCULAR | Status: AC
  Administered 2022-04-19: 4 mg via INTRAVENOUS
  Filled 2022-04-19: qty 2

## 2022-04-19 MED ORDER — KETOROLAC TROMETHAMINE 30 MG/ML IJ SOLN
30.0000 mg | Freq: Once | INTRAMUSCULAR | Status: AC
  Administered 2022-04-19: 30 mg via INTRAVENOUS
  Filled 2022-04-19: qty 1

## 2022-04-19 MED ORDER — IOHEXOL 300 MG/ML  SOLN
100.0000 mL | Freq: Once | INTRAMUSCULAR | Status: AC | PRN
  Administered 2022-04-19: 100 mL via INTRAVENOUS

## 2022-04-19 NOTE — ED Notes (Signed)
Pt to CT scan.

## 2022-04-19 NOTE — Discharge Instructions (Addendum)
Your CT scan did not show any sign of an inflamed pancreas or any other serious problem in your abdomen.  Please take either ibuprofen or naproxen as needed for pain.  If you need additional pain relief, add acetaminophen.  Return if your symptoms are getting worse, or are not being adequately controlled at home.  Your CT scan showed a cyst on your right ovary and the radiologist is recommending that you have a follow-up ultrasound in 3-6 months to make sure that the cyst has gone away.

## 2022-04-19 NOTE — ED Provider Notes (Signed)
[facility] EMERGENCY DEPARTMENT Provider Note   CSN: [id number] Arrival date & time: 04/18/22  2147     History  Chief Complaint  Patient presents with   Abdominal Pain    Infant Renee [name] is a 42 y.o. female.  The history is provided by the patient.  Abdominal Pain She has history of pancreatitis and comes in complaining of upper abdominal pain radiating to the back consistent with her pancreatitis.  This has been present for 2-3 days.  There has been associated nausea but no vomiting.  She has not taken anything for pain.  She denies alcohol ingestion.  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   Home Medications Prior to Admission medications   Medication Sig Start Date End Date Taking? Authorizing Provider  amLODipine (NORVASC) 5 MG tablet Take 1 tablet (5 mg total) by mouth daily. 02/14/22 05/15/22  Billie Lade, MD  Cholecalciferol (DIALYVITE VITAMIN D 5000) 125 MCG (5000 UT) capsule Take 5,000 Units by mouth daily.    [provider]  fluticasone (FLONASE) 50 MCG/ACT nasal spray Place 1 spray into both nostrils 2 (two) times daily. Patient taking differently: Place 1 spray into both nostrils 2 (two) times daily as needed for allergies. 03/25/22   Particia Nearing, PA-C  hydrOXYzine (ATARAX) 10 MG tablet Take 1 tablet (10 mg total) by mouth 3 (three) times daily as needed. 04/14/22   Billie Lade, MD  losartan (COZAAR) 100 MG tablet Take 100 mg by mouth daily.    [provider]  Multiple Vitamins-Minerals (MULTIVITAMIN WITH MINERALS) tablet Take 1 tablet by mouth daily.    [provider]  naproxen (NAPROSYN) 375 MG tablet Take 1 tablet (375 mg total) by mouth 2 (two) times daily with a meal. Patient taking differently: Take 375 mg by mouth daily. 03/11/21   Darreld Mclean, MD  sertraline (ZOLOFT) 50  MG tablet Take 1 tablet (50 mg total) by mouth daily. 04/14/22   Billie Lade, MD      Allergies    Patient has no known allergies.    Review of Systems   Review of Systems  Gastrointestinal:  Positive for abdominal pain.  All other systems reviewed and are negative.   Physical Exam Updated Vital Signs BP (!) 147/93   Pulse 73   Temp 98.6 F (37 C) (Oral)   Resp 18   Ht 5\' 1"  (1.549 m)   Wt 111.1 kg   LMP 03/24/2022 (Exact Date)   SpO2 99%   BMI 46.29 kg/m  Physical Exam Vitals and nursing note reviewed.   42 year old female, resting comfortably and in no acute distress. Vital signs are significant for mildly elevated blood pressure. Oxygen saturation is 99%, which is normal. Head is normocephalic and atraumatic. PERRLA, EOMI. Oropharynx is clear. Neck is nontender and supple without adenopathy or JVD. Back is nontender and there is no CVA tenderness. Lungs are clear without rales, wheezes, or rhonchi. Chest is nontender. Heart has regular rate and rhythm without murmur. Abdomen is soft, flat, with moderate epigastric tenderness.  There is no rebound or guarding. Extremities have no cyanosis or edema, full range of motion is present. Skin is warm and dry without rash. Neurologic: Mental status is normal, cranial nerves are intact, moves all extremities equally.  ED Results / Procedures / Treatments  Labs (all labs ordered are listed, but only abnormal results are displayed) Labs Reviewed  COMPREHENSIVE METABOLIC PANEL - Abnormal; Notable for the following components:      Result Value   CO2 20 (*)    Glucose, Bld 100 (*)    Calcium 10.7 (*)    All other components within normal limits  URINALYSIS, ROUTINE W REFLEX MICROSCOPIC - Abnormal; Notable for the following components:   Leukocytes,Ua SMALL (*)    Bacteria, UA RARE (*)    All other components within normal limits  LIPASE, BLOOD  CBC  POC URINE PREG, ED   Radiology CT ABDOMEN PELVIS W  CONTRAST  Result Date: 04/19/2022 CLINICAL DATA:  42 year old female with history of abdominal and back pain four a couple of days. Suspected pancreatitis. EXAM: CT ABDOMEN AND PELVIS WITH CONTRAST TECHNIQUE: Multidetector CT imaging of the abdomen and pelvis was performed using the standard protocol following bolus administration of intravenous contrast. RADIATION DOSE REDUCTION: This exam was performed according to the departmental dose-optimization program which includes automated exposure control, adjustment of the mA and/or kV according to patient size and/or use of iterative reconstruction technique. CONTRAST:  131mL OMNIPAQUE IOHEXOL 300 MG/ML  SOLN COMPARISON:  CT of the abdomen and pelvis 01/11/2022. FINDINGS: Lower chest: Unremarkable. Hepatobiliary: No suspicious cystic or solid hepatic lesions. No intra or extrahepatic biliary ductal dilatation. Status post cholecystectomy. Pancreas: Severe atrophy throughout the distal body and tail of the pancreas where there is persistent ductal dilatation measuring up to 6 mm in diameter, similar to the prior study. Head, uncinate process and proximal body of the pancreas are otherwise normal in appearance. No peripancreatic fluid collections or inflammatory changes. Large calcification in the proximal body of the pancreas associated with the previously described ductal dilatation, unchanged compared to the prior study. Spleen: Unremarkable. Adrenals/Urinary Tract: Nonobstructive calculi within the right renal collecting system measuring up to 4 mm in the interpolar region. Left kidney and right adrenal gland are normal in appearance. 2.1 x 1.7 cm left adrenal nodule, previously characterized as a benign adenoma on prior MRI (no imaging follow-up recommended). No hydroureteronephrosis. Urinary bladder is nearly completely decompressed, but otherwise unremarkable in appearance. Stomach/Bowel: The appearance of the stomach is normal. There is no pathologic dilatation  of small bowel or colon. Normal appendix. Vascular/Lymphatic: No significant atherosclerotic disease, aneurysm or dissection noted in the abdominal or pelvic vasculature. No lymphadenopathy noted in the abdomen or pelvis. Reproductive: Uterus and left ovary are unremarkable in appearance. 3.4 x 2.7 cm intermediate attenuation (39 HU) right ovarian lesion, suggesting a small hemorrhagic cyst. Other: No significant volume of ascites.  No pneumoperitoneum. Musculoskeletal: There are no aggressive appearing lytic or blastic lesions noted in the visualized portions of the skeleton. IMPRESSION: 1. No acute findings are noted in the abdomen or pelvis to account for the patient's symptoms. 2. Large calcification in the body of the pancreas with evidence of chronic proximal ductal obstruction and chronic atrophy throughout the body and tail of the pancreas, similar to prior studies, likely sequela of chronic pancreatitis. 3. Nonobstructive calculi in the right renal collecting system measuring up to 4 mm in the interpolar region. 4. New small right ovarian lesion, likely a hemorrhagic cyst. Follow-up pelvic ultrasound is recommended in 3-6 months to ensure the regression of this finding. Electronically Signed   By: Vinnie Langton M.D.   On: 04/19/2022 06:39    Procedures Procedures    Medications Ordered in ED Medications  ondansetron (ZOFRAN) injection 4 mg (  4 mg Intravenous Given 04/19/22 0519)  ketorolac (TORADOL) 30 MG/ML injection 30 mg (30 mg Intravenous Given 04/19/22 0520)  iohexol (OMNIPAQUE) 300 MG/ML solution 100 mL (100 mLs Intravenous Contrast Given 04/19/22 0538)    ED Course/ Medical Decision Making/ A&P                           Medical Decision Making Amount and/or Complexity of Data Reviewed Labs: ordered. Radiology: ordered.  Risk Prescription drug management.   Upper abdominal pain consistent with pancreatitis.  Differential diagnosis also includes cholecystitis, diverticulitis,  peptic ulcer disease, GERD.  I have reviewed and interpreted her laboratory tests, and my interpretation is mild elevation of serum calcium, normal lipase, normal transaminases and alkaline phosphatase and bilirubin, normal CBC.  I have reviewed her past medical records, and she was admitted to the hospital on 01/11/2022 for acute pancreatitis.  Curiously, her lipase was normal at that time and pancreatitis was identified by CT scan.  Therefore, I am still concerned that she has pancreatitis and I have ordered a CT of abdomen and pelvis.  I have ordered intravenous ketorolac for pain and ondansetron for nausea.  CT scan shows no evidence of pancreatitis, incidental finding of a right-sided ovarian cyst which is probably hemorrhagic.  I have independently viewed the images, and agree with radiologist interpretation.  Patient had good relief of pain with ketorolac and good relief of nausea from ondansetron.  I am discharging her with prescription for ondansetron and she is advised to use over-the-counter NSAIDs and acetaminophen as needed for pain.  I have recommended that she have a follow-up ultrasound to ensure resolution of her cyst.  Final Clinical Impression(s) / ED Diagnoses Final diagnoses:  Upper abdominal pain  Hypercalcemia  Right ovarian cyst    Rx / DC Orders ED Discharge Orders          Ordered    ondansetron (ZOFRAN) 4 MG tablet  Every 6 hours PRN        04/19/22 0700              Dione Booze, MD 04/19/22 [phone]

## 2022-04-28 ENCOUNTER — Telehealth: Payer: Self-pay | Admitting: Orthopaedic Surgery

## 2022-04-28 ENCOUNTER — Encounter: Payer: Self-pay | Admitting: Primary Care

## 2022-04-28 ENCOUNTER — Ambulatory Visit (INDEPENDENT_AMBULATORY_CARE_PROVIDER_SITE_OTHER): Payer: BC Managed Care – PPO | Admitting: Primary Care

## 2022-04-28 ENCOUNTER — Other Ambulatory Visit: Payer: Self-pay | Admitting: Internal Medicine

## 2022-04-28 VITALS — BP 140/82 | HR 85 | Temp 98.5°F | Ht 61.0 in | Wt 247.8 lb

## 2022-04-28 DIAGNOSIS — R0683 Snoring: Secondary | ICD-10-CM | POA: Insufficient documentation

## 2022-04-28 DIAGNOSIS — F32A Depression, unspecified: Secondary | ICD-10-CM

## 2022-04-28 NOTE — Progress Notes (Signed)
@Patient  ID: Renee [name], female    DOB: 23-Sep-1980, 42 y.o.   MRN: [id number]  Chief Complaint  Patient presents with   Consult    Frequent awakenings at night, waking up gasping for air, snoring, not feeling rested during day. Sx x 2-3 years.    Referring provider: Johnette Abraham, MD  HPI: 42 year old female, former smoker.  Past medical history significant for hypertension, acute on chronic pancreatitis, hyperparathyroidism, vitamin D deficiency.   04/28/2022 Patient has symptoms of snoring, waking up gasping for air during sleep, frequent nocturnal awakenings, nonrestorative sleep and daytime sleepiness. She works second shift and gets home around midnight. She works in a [employer].  She does not operate heavy machinery.  Typical bedtime is 4 AM. It can take her an hour to fall asleep.  She wakes up about twice a night.  She starts her day at 12 PM. Despite sleep position she has persistent snoring. She had her adenoids removed at a young age. Weight is down 20 pounds in the last 2 years.  She has chronic pancreatitis and has had to change her diet. No previous sleep study.  Epworth score 12.  Denies symptoms of narcolepsy, cataplexy or sleepwalking.  Sleep questionnaire Symptoms-   snoring, waking up gasping for air, non-restorative sleep, daytime sleepiness  Prior sleep study- none Bedtime- 4am Time to fall asleep- an hour Nocturnal awakenings- twice/night  Out of bed/start of day- 12pm Weight changes- down 20 lbs Do you operate heavy machinery- no Do you currently wear CPAP- no Do you current wear oxygen- no Epworth- 12     No Known Allergies  Immunization History  Administered Date(s) Administered   Influenza,inj,Quad PF,6+ Mos 01/21/2022   Influenza-Unspecified 05/04/2016   Tdap 05/04/2016    Past Medical History:  Diagnosis Date   Elevated serum creatinine 02/19/2021   Herpes simplex virus (HSV) infection    no OB +HSV 05/04/16 at [facility]    Hypertension     Hypothyroidism    Vaginal Pap smear, abnormal     Tobacco History: Social History   Tobacco Use  Smoking Status Former   Packs/day: 0.25   Years: 17.00   Total pack years: 4.25   Types: Cigarettes   Quit date: 01/09/2022   Years since quitting: 0.2  Smokeless Tobacco Never  Tobacco Comments   smokes 2-3 cig daily   Counseling given: Not Answered Tobacco comments: smokes 2-3 cig daily   Outpatient Medications Prior to Visit  Medication Sig Dispense Refill   amLODipine (NORVASC) 5 MG tablet Take 1 tablet (5 mg total) by mouth daily. 30 tablet 2   Cholecalciferol (DIALYVITE VITAMIN D 5000) 125 MCG (5000 UT) capsule Take 5,000 Units by mouth daily.     fluticasone (FLONASE) 50 MCG/ACT nasal spray Place 1 spray into both nostrils 2 (two) times daily. (Patient taking differently: Place 1 spray into both nostrils 2 (two) times daily as needed for allergies.) 16 g 2   hydrOXYzine (ATARAX) 10 MG tablet Take 1 tablet (10 mg total) by mouth 3 (three) times daily as needed. 30 tablet 0   losartan (COZAAR) 100 MG tablet Take 100 mg by mouth daily.     Multiple Vitamins-Minerals (MULTIVITAMIN WITH MINERALS) tablet Take 1 tablet by mouth daily.     naproxen (NAPROSYN) 375 MG tablet Take 1 tablet (375 mg total) by mouth 2 (two) times daily with a meal. (Patient taking differently: Take 375 mg by mouth daily.) 60 tablet 5   ondansetron (ZOFRAN)  4 MG tablet Take 1 tablet (4 mg total) by mouth every 6 (six) hours as needed for nausea. 20 tablet 0   sertraline (ZOLOFT) 50 MG tablet Take 1 tablet (50 mg total) by mouth daily. 30 tablet 3   No facility-administered medications prior to visit.   Review of Systems  Review of Systems  Constitutional: Negative.   HENT: Negative.    Respiratory: Negative.    Cardiovascular: Negative.    Physical Exam  BP (!) 140/82 (BP Location: Right Arm, Patient Position: Sitting, Cuff Size: Large)   [name] 85   Temp 98.5 F (36.9 C) (Oral)   Ht 5\' 1"   (1.549 m)   Wt 247 lb 12.8 oz (112.4 kg)   SpO2 98%   BMI 46.82 kg/m  Physical Exam Constitutional:      Appearance: Normal appearance.  HENT:     Head: Normocephalic and atraumatic.     Mouth/Throat:     Comments: Mallampati class II-III Cardiovascular:     Rate and Rhythm: Normal rate and regular rhythm.  Pulmonary:     Effort: Pulmonary effort is normal.     Breath sounds: Normal breath sounds. No wheezing, rhonchi or rales.  Musculoskeletal:        General: Normal range of motion.     Cervical back: Normal range of motion and neck supple.  Skin:    General: Skin is warm and dry.  Neurological:     General: No focal deficit present.     Mental Status: She is alert and oriented to person, place, and time. Mental status is at baseline.  Psychiatric:        Mood and Affect: Mood normal.        Behavior: Behavior normal.        Thought Content: Thought content normal.        Judgment: Judgment normal.      Lab Results:  CBC    Component Value Date/Time   WBC 8.9 04/18/2022 2249   RBC 4.83 04/18/2022 2249   HGB 13.7 04/18/2022 2249   HGB 12.4 01/21/2022 0918   HCT 42.2 04/18/2022 2249   HCT 39.0 01/21/2022 0918   PLT 291 04/18/2022 2249   PLT 300 01/21/2022 0918   MCV 87.4 04/18/2022 2249   MCV 88 01/21/2022 0918   MCH 28.4 04/18/2022 2249   MCHC 32.5 04/18/2022 2249   RDW 13.2 04/18/2022 2249   RDW 12.5 01/21/2022 0918   LYMPHSABS 1.4 01/21/2022 0918   MONOABS 0.8 01/12/2022 0417   EOSABS 0.2 01/21/2022 0918   BASOSABS 0.0 01/21/2022 0918    BMET    Component Value Date/Time   NA 135 04/18/2022 2249   NA 140 02/16/2022 1456   K 4.2 04/18/2022 2249   CL 107 04/18/2022 2249   CO2 20 (L) 04/18/2022 2249   GLUCOSE 100 (H) 04/18/2022 2249   BUN 12 04/18/2022 2249   BUN 9 02/16/2022 1456   CREATININE 0.78 04/18/2022 2249   CALCIUM 10.7 (H) 04/18/2022 2249   GFRNONAA >60 04/18/2022 2249   GFRAA >60 01/13/2020 1943    BNP No results found for:  "BNP"  ProBNP No results found for: "PROBNP"  Imaging: CT ABDOMEN PELVIS W CONTRAST  Result Date: 04/19/2022 CLINICAL DATA:  42 year old female with history of abdominal and back pain four a couple of days. Suspected pancreatitis. EXAM: CT ABDOMEN AND PELVIS WITH CONTRAST TECHNIQUE: Multidetector CT imaging of the abdomen and pelvis was performed using the standard protocol following bolus  administration of intravenous contrast. RADIATION DOSE REDUCTION: This exam was performed according to the departmental dose-optimization program which includes automated exposure control, adjustment of the mA and/or kV according to patient size and/or use of iterative reconstruction technique. CONTRAST:  156mL OMNIPAQUE IOHEXOL 300 MG/ML  SOLN COMPARISON:  CT of the abdomen and pelvis 01/11/2022. FINDINGS: Lower chest: Unremarkable. Hepatobiliary: No suspicious cystic or solid hepatic lesions. No intra or extrahepatic biliary ductal dilatation. Status post cholecystectomy. Pancreas: Severe atrophy throughout the distal body and tail of the pancreas where there is persistent ductal dilatation measuring up to 6 mm in diameter, similar to the prior study. Head, uncinate process and proximal body of the pancreas are otherwise normal in appearance. No peripancreatic fluid collections or inflammatory changes. Large calcification in the proximal body of the pancreas associated with the previously described ductal dilatation, unchanged compared to the prior study. Spleen: Unremarkable. Adrenals/Urinary Tract: Nonobstructive calculi within the right renal collecting system measuring up to 4 mm in the interpolar region. Left kidney and right adrenal gland are normal in appearance. 2.1 x 1.7 cm left adrenal nodule, previously characterized as a benign adenoma on prior MRI (no imaging follow-up recommended). No hydroureteronephrosis. Urinary bladder is nearly completely decompressed, but otherwise unremarkable in appearance.  Stomach/Bowel: The appearance of the stomach is normal. There is no pathologic dilatation of small bowel or colon. Normal appendix. Vascular/Lymphatic: No significant atherosclerotic disease, aneurysm or dissection noted in the abdominal or pelvic vasculature. No lymphadenopathy noted in the abdomen or pelvis. Reproductive: Uterus and left ovary are unremarkable in appearance. 3.4 x 2.7 cm intermediate attenuation (39 HU) right ovarian lesion, suggesting a small hemorrhagic cyst. Other: No significant volume of ascites.  No pneumoperitoneum. Musculoskeletal: There are no aggressive appearing lytic or blastic lesions noted in the visualized portions of the skeleton. IMPRESSION: 1. No acute findings are noted in the abdomen or pelvis to account for the patient's symptoms. 2. Large calcification in the body of the pancreas with evidence of chronic proximal ductal obstruction and chronic atrophy throughout the body and tail of the pancreas, similar to prior studies, likely sequela of chronic pancreatitis. 3. Nonobstructive calculi in the right renal collecting system measuring up to 4 mm in the interpolar region. 4. New small right ovarian lesion, likely a hemorrhagic cyst. Follow-up pelvic ultrasound is recommended in 3-6 months to ensure the regression of this finding. Electronically Signed   By: Vinnie Langton M.D.   On: 04/19/2022 06:39     Assessment & Plan:   Loud snoring - Patient has symptoms loud snoring, frequent nocturnal awakenings, waking up gasping for air and daytime sleepiness. Epworth 12/24. BMI 46.  Concern patient could have underlying obstructive sleep apnea, needs home sleep study to evaluate. No prior history cardiopulmonary disease. No symptoms of narcolepsy, cataplexy or RLS. We reviewed risks of untreated sleep apnea including cardiac arrhythmias, pulmonary hypertension, diabetes and stroke.  We also discussed treatment options including weight loss, oral appliance, CPAP therapy or  referral to ENT for possible surgical options. Sleep position has not helped persistent snoring.  She had adenoids removed at a young age.  No prior sleep studies.  Encourage weight loss efforts.  Advised against driving experiencing excessive daytime sleepiness fatigue and avoid alcohol use prior to bedtime.  Follow-up 1 to 2 weeks after sleep study review results and treatment options if needed.   Martyn Ehrich, NP 04/28/2022

## 2022-04-28 NOTE — Patient Instructions (Signed)

## 2022-04-28 NOTE — Progress Notes (Signed)
Reviewed and agree with assessment/plan.   Keelyn Monjaras, MD [facility] 04/28/2022, 1:29 PM Pager:  [phone]  

## 2022-04-28 NOTE — Assessment & Plan Note (Addendum)
-  Patient has symptoms loud snoring, frequent nocturnal awakenings, waking up gasping for air and daytime sleepiness. Epworth 12/24. BMI 46.  Concern patient could have underlying obstructive sleep apnea, needs home sleep study to evaluate. No prior history cardiopulmonary disease. No symptoms of narcolepsy, cataplexy or RLS. We reviewed risks of untreated sleep apnea including cardiac arrhythmias, pulmonary hypertension, diabetes and stroke.  We also discussed treatment options including weight loss, oral appliance, CPAP therapy or referral to ENT for possible surgical options. Sleep position has not helped persistent snoring.  She had adenoids removed at a young age.  No prior sleep studies.  Encourage weight loss efforts.  Advised against driving experiencing excessive daytime sleepiness fatigue and avoid alcohol use prior to bedtime.  Follow-up 1 to 2 weeks after sleep study review results and treatment options if needed.

## 2022-05-11 ENCOUNTER — Ambulatory Visit (HOSPITAL_COMMUNITY): Payer: BC Managed Care – PPO | Admitting: Clinical

## 2022-05-11 ENCOUNTER — Telehealth (HOSPITAL_COMMUNITY): Payer: Self-pay | Admitting: Clinical

## 2022-05-11 NOTE — Telephone Encounter (Signed)
Patient was a no show for this in person scheduled appointment.

## 2022-05-12 ENCOUNTER — Ambulatory Visit: Payer: BC Managed Care – PPO | Admitting: Internal Medicine

## 2022-05-13 ENCOUNTER — Encounter: Payer: Self-pay | Admitting: Internal Medicine

## 2022-05-17 ENCOUNTER — Ambulatory Visit (INDEPENDENT_AMBULATORY_CARE_PROVIDER_SITE_OTHER): Payer: BC Managed Care – PPO | Admitting: Clinical

## 2022-05-17 DIAGNOSIS — F331 Major depressive disorder, recurrent, moderate: Secondary | ICD-10-CM | POA: Diagnosis not present

## 2022-05-17 DIAGNOSIS — F419 Anxiety disorder, unspecified: Secondary | ICD-10-CM

## 2022-05-17 DIAGNOSIS — F4310 Post-traumatic stress disorder, unspecified: Secondary | ICD-10-CM | POA: Diagnosis not present

## 2022-05-17 NOTE — Progress Notes (Signed)
Virtual Visit via Video Note  I connected with Kemiyah K Utley on 05/17/22 at  9:00 AM EST by a video enabled telemedicine application and verified that I am speaking with the correct person using two identifiers.  Location: Patient: Home Provider: Office   I discussed the limitations of evaluation and management by telemedicine and the availability of in person appointments. The patient expressed understanding and agreed to proceed.  THERAPIST PROGRESS NOTE   Session Time: 9:00 AM-9:30 AM   Participation Level: Active   Behavioral Response: CasualAlertDepressed   Type of Therapy: Individual Therapy   Treatment Goals addressed: Diagnosis: Bi Polar (Depression)   Interventions: CBT   Summary: Jalon K. Detjen is a 42 y.o. female who presents with Recurrent MDD with Anxiety and PTSD . The OPT therapist worked with the patient for her OPT treatment. The OPT therapist utilized Motivational Interviewing to assist in creating therapeutic repore. The patient in the session was engaged and work in collaboration giving feedback about her triggers and symptoms over the past few weeks. The patient spoke about work related stressors and interactions with co-workers. The OPT therapist utilized Cognitive Behavioral Therapy through cognitive restructuring as well as worked with the patient on coping strategies to assist in management of mood. The patient spoke about being nervious about connecting with other people and be more social .The OPT therapist reviewed with the patient her upcoming appointments as listed in her [place]. The OPT therapist worked with the patient on taking each stressor one at a time.   Suicidal/Homicidal: Nowithout intent/plan   Therapist Response: The OPT therapist worked with the patient for her OPT session. The OPT therapist utilized Motivational Interviewing to assist in creating therapeutic repore. The patient in the session was engaged and work in collaboration giving  feedback about her triggers and symptoms over the past few weeks. The OPT therapist utilized Cognitive Behavioral Therapy through cognitive restructuring and challenging negative thinking, positive self talk, and implementation of coping skills. The patient noted, " I do not do a lot stay at home more but I am looking forward to a vacation I would like to go to the beach".  The OPT therapist worked with the patient on managing what is currently in her control. The OPT therapist ensured without transportation the patient was able to meet her basic needs. The patient noted she is still having difficulty with maintaining bills and work due to having a stone in her pancrease.The patient has been instructed that she may need surgery for thyroid. The patient is waiting to see is she gets a tax refund that would allow her to cover the $500 part that she would need to pay with her insurance to cover the cost of the surgery. The OPT therapist will continue to work with the patient in her next scheduled session.   Plan: Return again in 2/3 weeks.   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 The patient was provided an opportunity to ask questions and all were answered. The patient agreed with the plan and demonstrated an understanding of the instructions.   The patient was advised to call back or seek an in-person evaluation if the symptoms worsen or if  the condition fails to improve as anticipated.   I provided 30 minutes of non-face-to-face time during this encounter.   Lennox Grumbles, LCSW   05/17/2022

## 2022-05-19 ENCOUNTER — Ambulatory Visit: Payer: BC Managed Care – PPO | Admitting: Internal Medicine

## 2022-05-19 ENCOUNTER — Encounter: Payer: Self-pay | Admitting: Internal Medicine

## 2022-05-19 VITALS — BP 136/83 | HR 76 | Ht 61.0 in | Wt 241.4 lb

## 2022-05-19 DIAGNOSIS — K859 Acute pancreatitis without necrosis or infection, unspecified: Secondary | ICD-10-CM

## 2022-05-19 DIAGNOSIS — F32A Depression, unspecified: Secondary | ICD-10-CM

## 2022-05-19 DIAGNOSIS — F419 Anxiety disorder, unspecified: Secondary | ICD-10-CM | POA: Diagnosis not present

## 2022-05-19 DIAGNOSIS — I10 Essential (primary) hypertension: Secondary | ICD-10-CM

## 2022-05-19 DIAGNOSIS — K861 Other chronic pancreatitis: Secondary | ICD-10-CM | POA: Diagnosis not present

## 2022-05-19 MED ORDER — KETOROLAC TROMETHAMINE 60 MG/2ML IM SOLN
60.0000 mg | Freq: Once | INTRAMUSCULAR | Status: AC
  Administered 2022-05-19: 60 mg via INTRAMUSCULAR

## 2022-05-19 NOTE — Assessment & Plan Note (Signed)
Presenting today for follow-up of anxiety and depression.  Zoloft 50 mg daily was started at her last appointment.  She states this has been effective in improving her mood and managing her anxiety.  She currently denies SI/HI.  She does not desire to make any additional changes today. -Continue Zoloft 50 mg daily

## 2022-05-19 NOTE — Progress Notes (Signed)
Established Patient Office Visit  Subjective   Patient ID: Renee [name], female    DOB: June 09, 1980  Age: 42 y.o. MRN: [id number]  Chief Complaint  Patient presents with   GAD    Follow up   Renee [name] returns to care today for anxiety / depression follow up.  She was last seen by me on 1/4 at which time her PHQ-9 and GAD-7 scores were significantly elevated.  She became tearful when describing significant life stressors.  Zoloft 50 mg daily was started for treatment of anxiety and depression.  4-week follow-up was arranged.  In the interim, she presented to the emergency department in early January endorsing abdominal pain.  She has also been seen by pulmonology for OSA evaluation.  Today Ms. Salser states that Zoloft has been effective in improving her mood.  She is pleased with her current dose and does not desire to make any additional changes today.  Her acute concern is persistent epigastric abdominal pain.  She has not been able to undergo parathyroidectomy as she continues to gather phones.  She describes epigastric pain radiating to her back, similar to prior episodes of pancreatitis.  Past Medical History:  Diagnosis Date   Elevated serum creatinine 02/19/2021   Herpes simplex virus (HSV) infection    no OB +HSV 05/04/16 at [facility]    Hypertension    Hypothyroidism    Vaginal Pap smear, abnormal    Past Surgical History:  Procedure Laterality Date   BIOPSY  03/31/2022   Procedure: BIOPSY;  Surgeon: Renee [name]., MD;  Location: [facility];  Service: Gastroenterology;;   CHOLECYSTECTOMY N/A 04/13/2020   Procedure: LAPAROSCOPIC CHOLECYSTECTOMY WITH INTRAOPERATIVE CHOLANGIOGRAM;  Surgeon: Renee Gemma, MD;  Location: [facility];  Service: General;  Laterality: N/A;   ESOPHAGOGASTRODUODENOSCOPY (EGD) WITH PROPOFOL N/A 03/31/2022   Procedure: ESOPHAGOGASTRODUODENOSCOPY (EGD) WITH PROPOFOL;  Surgeon: Renee [name]., MD;  Location: [facility];  Service:  Gastroenterology;  Laterality: N/A;   EUS N/A 03/31/2022   Procedure: UPPER ENDOSCOPIC ULTRASOUND (EUS) LINEAR;  Surgeon: Renee [name]., MD;  Location: [facility];  Service: Gastroenterology;  Laterality: N/A;   KNEE SURGERY     TONSILLECTOMY     Social History   Tobacco Use   Smoking status: Former    Packs/day: 0.25    Years: 17.00    Total pack years: 4.25    Types: Cigarettes    Quit date: 01/09/2022    Years since quitting: 0.3   Smokeless tobacco: Never   Tobacco comments:    smokes 2-3 cig daily  Vaping Use   Vaping Use: Never used  Substance Use Topics   Alcohol use: Not Currently    Comment: socially   Drug use: Yes    Types: Marijuana    Comment: occasionally   Family History  Problem Relation Age of Onset   Congestive Heart Failure Maternal Grandmother    Hypertension Maternal Grandmother    Diabetes Maternal Grandmother    Other Maternal Grandfather        aneursym   Kidney disease Maternal Grandfather        on dialysis   Congestive Heart Failure Maternal Grandfather    Diabetes Maternal Grandfather    No Known Allergies  Review of Systems  Gastrointestinal:  Positive for abdominal pain (Epigastric pain).     Objective:     BP 136/83   Pulse 76   Ht 5\' 1"  (1.549 m)   Wt 241 lb 6.4 oz (109.5 kg)  SpO2 95%   BMI 45.61 kg/m  BP Readings from Last 3 Encounters:  05/19/22 136/83  04/28/22 (!) 140/82  04/19/22 135/82   Physical Exam Vitals reviewed.  Constitutional:      General: She is not in acute distress.    Appearance: Normal appearance. She is obese. She is not toxic-appearing.  HENT:     Head: Normocephalic and atraumatic.     Right Ear: External ear normal.     Left Ear: External ear normal.     Nose: Nose normal. No congestion or rhinorrhea.     Mouth/Throat:     Mouth: Mucous membranes are moist.     Pharynx: Oropharynx is clear. No oropharyngeal exudate or posterior oropharyngeal erythema.  Eyes:     General: No  scleral icterus.    Extraocular Movements: Extraocular movements intact.     Conjunctiva/sclera: Conjunctivae normal.     Pupils: Pupils are equal, round, and reactive to light.  Cardiovascular:     Rate and Rhythm: Normal rate and regular rhythm.     Pulses: Normal pulses.     Heart sounds: Normal heart sounds. No murmur heard.    No friction rub. No gallop.  Pulmonary:     Effort: Pulmonary effort is normal.     Breath sounds: Normal breath sounds. No wheezing, rhonchi or rales.  Abdominal:     General: Abdomen is flat. Bowel sounds are normal. There is no distension.     Palpations: Abdomen is soft.     Tenderness: There is abdominal tenderness (TTP over epigastric region).  Musculoskeletal:        General: No swelling. Normal range of motion.     Cervical back: Normal range of motion.     Right lower leg: No edema.     Left lower leg: No edema.  Lymphadenopathy:     Cervical: No cervical adenopathy.  Skin:    General: Skin is warm and dry.     Capillary Refill: Capillary refill takes less than 2 seconds.     Coloration: Skin is not jaundiced.  Neurological:     General: No focal deficit present.     Mental Status: She is alert and oriented to person, place, and time.  Psychiatric:        Mood and Affect: Mood normal.        Behavior: Behavior normal.    Last CBC Lab Results  Component Value Date   WBC 8.9 04/18/2022   HGB 13.7 04/18/2022   HCT 42.2 04/18/2022   MCV 87.4 04/18/2022   MCH 28.4 04/18/2022   RDW 13.2 04/18/2022   PLT 291 18/84/1660   Last metabolic panel Lab Results  Component Value Date   GLUCOSE 100 (H) 04/18/2022   NA 135 04/18/2022   K 4.2 04/18/2022   CL 107 04/18/2022   CO2 20 (L) 04/18/2022   BUN 12 04/18/2022   CREATININE 0.78 04/18/2022   GFRNONAA >60 04/18/2022   CALCIUM 10.7 (H) 04/18/2022   PHOS 2.0 (L) 01/12/2022   PROT 7.6 04/18/2022   ALBUMIN 4.1 04/18/2022   LABGLOB 3.0 02/16/2022   AGRATIO 1.5 02/16/2022   BILITOT 0.4  04/18/2022   ALKPHOS 70 04/18/2022   AST 18 04/18/2022   ALT 17 04/18/2022   ANIONGAP 8 04/18/2022   Last lipids Lab Results  Component Value Date   CHOL 128 01/12/2022   HDL 48 01/12/2022   LDLCALC 67 01/12/2022   TRIG 66 01/12/2022   CHOLHDL 2.7 01/12/2022  Last hemoglobin A1c Lab Results  Component Value Date   HGBA1C 5.6 01/21/2022   Last thyroid functions Lab Results  Component Value Date   TSH 1.900 01/21/2022   Last vitamin D Lab Results  Component Value Date   VD25OH 25.4 (L) 01/21/2022     Assessment & Plan:   Problem List Items Addressed This Visit       Essential hypertension    Currently prescribed losartan 100 mg daily and amlodipine 5 mg daily for treatment of hypertension.  Her blood pressure is mildly elevated today, likely attributable to pain. -No medication changes today.      Acute on chronic pancreatitis ([place]) - Primary    Secondary to hyperparathyroidism.  Parathyroidectomy surgery is pending as she continues to get her phones.  She will follow-up with gastroenterology in the spring to discuss ERCP with stent placement.  Today she endorses epigastric pain and has significant tenderness palpation on exam, similar to prior episodes of pancreatitis. -Toradol injection administered today      Anxiety and depression    Presenting today for follow-up of anxiety and depression.  Zoloft 50 mg daily was started at her last appointment.  She states this has been effective in improving her mood and managing her anxiety.  She currently denies SI/HI.  She does not desire to make any additional changes today. -Continue Zoloft 50 mg daily      Return in about 3 months (around 08/17/2022).   Johnette Abraham, MD

## 2022-05-19 NOTE — Patient Instructions (Signed)
It was a pleasure to see you today.  Thank you for giving [place] the opportunity to be involved in your care.  Below is a brief recap of your visit and next steps.  We will plan to see you again in 3 months.  Summary No medication changes today. Continue Zoloft at current dose Toradol injection ordered today We will plan for follow up in 3 months

## 2022-05-19 NOTE — Assessment & Plan Note (Signed)
Currently prescribed losartan 100 mg daily and amlodipine 5 mg daily for treatment of hypertension.  Her blood pressure is mildly elevated today, likely attributable to pain. -No medication changes today.

## 2022-05-19 NOTE — Assessment & Plan Note (Signed)
Secondary to hyperparathyroidism.  Parathyroidectomy surgery is pending as she continues to get her phones.  She will follow-up with gastroenterology in the spring to discuss ERCP with stent placement.  Today she endorses epigastric pain and has significant tenderness palpation on exam, similar to prior episodes of pancreatitis. -Toradol injection administered today

## 2022-05-30 ENCOUNTER — Ambulatory Visit: Payer: BC Managed Care – PPO | Admitting: Adult Health

## 2022-05-31 ENCOUNTER — Other Ambulatory Visit: Payer: Self-pay | Admitting: Internal Medicine

## 2022-05-31 ENCOUNTER — Encounter: Payer: Self-pay | Admitting: Gastroenterology

## 2022-05-31 DIAGNOSIS — F32A Depression, unspecified: Secondary | ICD-10-CM

## 2022-06-03 ENCOUNTER — Ambulatory Visit (INDEPENDENT_AMBULATORY_CARE_PROVIDER_SITE_OTHER): Payer: BC Managed Care – PPO

## 2022-06-03 ENCOUNTER — Ambulatory Visit
Admission: EM | Admit: 2022-06-03 | Discharge: 2022-06-03 | Disposition: A | Discharge status: Home / Self Care | Payer: BC Managed Care – PPO | Attending: Nurse Practitioner | Admitting: Nurse Practitioner

## 2022-06-03 ENCOUNTER — Telehealth: Payer: BC Managed Care – PPO | Admitting: Physician Assistant

## 2022-06-03 DIAGNOSIS — G43819 Other migraine, intractable, without status migrainosus: Secondary | ICD-10-CM

## 2022-06-03 DIAGNOSIS — R109 Unspecified abdominal pain: Secondary | ICD-10-CM | POA: Diagnosis not present

## 2022-06-03 DIAGNOSIS — R1012 Left upper quadrant pain: Secondary | ICD-10-CM

## 2022-06-03 DIAGNOSIS — R1013 Epigastric pain: Secondary | ICD-10-CM

## 2022-06-03 MED ORDER — ONDANSETRON HCL 4 MG PO TABS: 4.0000 mg | ORAL_TABLET | Freq: Four times a day (QID) | ORAL | 0 refills | Status: DC

## 2022-06-03 NOTE — Discharge Instructions (Addendum)
Your abdominal x-ray shows; Mild colonic stool burden. Your labs are pending.  Your results will show in your MyChart. Any acutely abnormal results will result in a phone call from a nurse with next steps in treatment and recommendations.  Encouraged stool softeners one to two times per day based on what was discussed Encouraged Miralax QOD to daily based on need and dicussion Encouraged hydration with water at least 8-10 8 ounce glasses per day Eat foods that have a lot of fiber, such as: Fresh fruits and vegetables. Whole grains. Beans. Eat less of foods that are high in fat, low in fiber, or overly processed Encouraged regular daily exercise starting with walking 20 minutes per day

## 2022-06-03 NOTE — ED Provider Notes (Signed)
[facility]    CSN: [id number] Arrival date & time: 06/03/22  1502      History   Chief Complaint No chief complaint on file.   HPI Renee [name] is a 42 y.o. female.   HPI  She is complaining of abdominal pain with nausea. She denies any vomiting. She has a history of constipation last BM several days ago. She does not drink a lot of water. She denies any active tx for the constipation. She reports a hx of pancreatitis. She denies any ETOH use. Denies headache, dizziness, visual changes, shortness of breath, dyspnea on exertion, chest pain, or any edema.   Past Medical History:  Diagnosis Date   Elevated serum creatinine 02/19/2021   Herpes simplex virus (HSV) infection    no OB +HSV 05/04/16 at [facility]    Hypertension    Hypothyroidism    Vaginal Pap smear, abnormal     Patient Active Problem List   Diagnosis Date Noted   Loud snoring 04/28/2022   Anxiety and depression 04/14/2022   Constipation 02/08/2022   History of vitamin D deficiency 01/26/2022   Risk factors for obstructive sleep apnea 01/26/2022   Positive screening for depression on 9-item Patient Health Questionnaire (PHQ-9) 01/26/2022   Encounter to establish care 01/26/2022   Elevated LFTs    Essential hypertension 01/12/2022   UTI (urinary tract infection) 01/12/2022   Pancreatic duct dilated    Acute on chronic pancreatitis ([place]) 01/11/2022   Hypercalcemia 02/19/2021   Elevated serum creatinine 02/19/2021   Screening mammogram for breast cancer 02/16/2021   Encounter for screening fecal occult blood testing 02/16/2021   Encounter for gynecological examination with Papanicolaou smear of cervix 02/16/2021   Cholelithiasis with chronic cholecystitis 04/13/2020   Cholelithiasis with cholecystitis 04/02/2020   Hyperparathyroidism, primary ([place]) 04/02/2020   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/2015    Past Surgical History:  Procedure Laterality Date   BIOPSY  03/31/2022   Procedure: BIOPSY;   Surgeon: Irving Copas., MD;  Location: [facility];  Service: Gastroenterology;;   CHOLECYSTECTOMY N/A 04/13/2020   Procedure: LAPAROSCOPIC CHOLECYSTECTOMY WITH INTRAOPERATIVE CHOLANGIOGRAM;  Surgeon: Armandina Gemma, MD;  Location: [facility];  Service: General;  Laterality: N/A;   ESOPHAGOGASTRODUODENOSCOPY (EGD) WITH PROPOFOL N/A 03/31/2022   Procedure: ESOPHAGOGASTRODUODENOSCOPY (EGD) WITH PROPOFOL;  Surgeon: Irving Copas., MD;  Location: [facility];  Service: Gastroenterology;  Laterality: N/A;   EUS N/A 03/31/2022   Procedure: UPPER ENDOSCOPIC ULTRASOUND (EUS) LINEAR;  Surgeon: Irving Copas., MD;  Location: [facility];  Service: Gastroenterology;  Laterality: N/A;   KNEE SURGERY     TONSILLECTOMY      OB History     Gravida  2   Para      Term      Preterm      AB  2   Living         SAB      IAB      Ectopic      Multiple      Live Births               Home Medications    Prior to Admission medications   Medication Sig Start Date End Date Taking? Authorizing Provider  amLODipine (NORVASC) 5 MG tablet Take 1 tablet (5 mg total) by mouth daily. 02/14/22 05/19/22  Johnette Abraham, MD  Cholecalciferol (DIALYVITE VITAMIN D 5000) 125 MCG (5000 UT) capsule Take 5,000 Units by mouth daily.    [provider]  fluticasone (FLONASE) 50 MCG/ACT nasal spray Place 1 spray into both nostrils 2 (two) times daily. Patient taking differently: Place 1 spray into both nostrils 2 (two) times daily as needed for allergies. 03/25/22   Volney American, PA-C  hydrOXYzine (ATARAX) 10 MG tablet Take 1 tablet (10 mg total) by mouth 3 (three) times daily as needed. 04/14/22   Johnette Abraham, MD  losartan (COZAAR) 100 MG tablet Take 100 mg by mouth daily.    [provider]  Multiple Vitamins-Minerals (MULTIVITAMIN WITH MINERALS) tablet Take 1 tablet by mouth daily.    [provider]  naproxen (NAPROSYN) 375 MG tablet Take 1  tablet (375 mg total) by mouth daily. 04/29/22   Sanjuana Kava, MD  ondansetron (ZOFRAN) 4 MG tablet Take 1 tablet (4 mg total) by mouth every 6 (six) hours as needed for nausea. [id number]   Delora Fuel, MD  sertraline (ZOLOFT) 50 MG tablet TAKE 1 TABLET BY MOUTH EVERY DAY 05/31/22   Johnette Abraham, MD    Family History Family History  Problem Relation Age of Onset   Congestive Heart Failure Maternal Grandmother    Hypertension Maternal Grandmother    Diabetes Maternal Grandmother    Other Maternal Grandfather        aneursym   Kidney disease Maternal Grandfather        on dialysis   Congestive Heart Failure Maternal Grandfather    Diabetes Maternal Grandfather     Social History Social History   Tobacco Use   Smoking status: Former    Packs/day: 0.25    Years: 17.00    Total pack years: 4.25    Types: Cigarettes    Quit date: 01/09/2022    Years since quitting: 0.3   Smokeless tobacco: Never   Tobacco comments:    smokes 2-3 cig daily  Vaping Use   Vaping Use: Never used  Substance Use Topics   Alcohol use: Not Currently    Comment: socially   Drug use: Yes    Types: Marijuana    Comment: occasionally     Allergies   Patient has no known allergies.   Review of Systems Review of Systems   Physical Exam Triage Vital Signs ED Triage Vitals  Enc Vitals Group     BP 06/03/22 1602 127/87     Pulse Rate 06/03/22 1602 66     Resp 06/03/22 1602 20     Temp 06/03/22 1602 98.4 F (36.9 C)     Temp Source 06/03/22 1602 Oral     SpO2 06/03/22 1602 97 %     Weight --      Height --      Head Circumference --      Peak Flow --      Pain Score 06/03/22 1604 5     Pain Loc --      Pain Edu? --      Excl. in [place]? --    No data found.  Updated Vital Signs BP 127/87 (BP Location: Right Arm)   Pulse 66   Temp 98.4 F (36.9 C) (Oral)   Resp 20   LMP 06/03/2022   SpO2 97%   Visual Acuity Right Eye Distance:   Left Eye Distance:   Bilateral Distance:     Right Eye Near:   Left Eye Near:    Bilateral Near:     Physical Exam Constitutional:      General: She is not in acute distress.  Appearance: She is obese. She is not ill-appearing, toxic-appearing or diaphoretic.  HENT:     Head: Normocephalic and atraumatic.  Cardiovascular:     Rate and Rhythm: Normal rate and regular rhythm.     Pulses: Normal pulses.     Heart sounds: Normal heart sounds.  Pulmonary:     Effort: Pulmonary effort is normal.     Breath sounds: Normal breath sounds.  Abdominal:     General: There is no distension.     Palpations: There is no mass.     Tenderness: There is no abdominal tenderness. There is no rebound.     Hernia: No hernia is present.     Comments: Increased abdominal girth Hypoactive BS  Musculoskeletal:        General: Normal range of motion.     Cervical back: Normal range of motion.  Skin:    General: Skin is warm and dry.     Capillary Refill: Capillary refill takes less than 2 seconds.  Neurological:     General: No focal deficit present.     Mental Status: She is alert and oriented to person, place, and time.  Psychiatric:        Mood and Affect: Mood normal.        Behavior: Behavior normal.      UC Treatments / Results  Labs (all labs ordered are listed, but only abnormal results are displayed) Labs Reviewed  LIPASE  CBC  COMPREHENSIVE METABOLIC PANEL    EKG   Radiology No results found.  Procedures Procedures (including critical care time)  Medications Ordered in UC Medications - No data to display  Initial Impression / Assessment and Plan / UC Course  I have reviewed the triage vital signs and the nursing notes.  Pertinent labs & imaging results that were available during my care of the patient were reviewed by me and considered in my medical decision making (see chart for details).     Abdominal pain Final Clinical Impressions(s) / UC Diagnoses   Final diagnoses:  Left upper quadrant  abdominal pain   Discharge Instructions   None    ED Prescriptions   None    PDMP not reviewed this encounter.   Dionisio David Od[place], [place] 06/03/22 [phone]

## 2022-06-03 NOTE — Progress Notes (Signed)
Virtual Visit Consent   Renee [name], you are scheduled for a virtual visit with a [facility] provider today. Just as with appointments in the office, your consent must be obtained to participate. Your consent will be active for this visit and any virtual visit you may have with one of our providers in the next 365 days. If you have a MyChart account, a copy of this consent can be sent to you electronically.  As this is a virtual visit, video technology does not allow for your provider to perform a traditional examination. This may limit your provider's ability to fully assess your condition. If your provider identifies any concerns that need to be evaluated in person or the need to arrange testing (such as labs, EKG, etc.), we will make arrangements to do so. Although advances in technology are sophisticated, we cannot ensure that it will always work on either your end or our end. If the connection with a video visit is poor, the visit may have to be switched to a telephone visit. With either a video or telephone visit, we are not always able to ensure that we have a secure connection.  By engaging in this virtual visit, you consent to the provision of healthcare and authorize for your insurance to be billed (if applicable) for the services provided during this visit. Depending on your insurance coverage, you may receive a charge related to this service.  I need to obtain your verbal consent now. Are you willing to proceed with your visit today? Renee [name] has provided verbal consent on 06/03/2022 for a virtual visit (video or telephone). Leeanne Rio, [place]  Date: 06/03/2022 2:50 PM  Virtual Visit via Video Note   I, Leeanne Rio, connected with  Renee [name]  ([id number], 1980-09-07) on 06/03/22 at  2:45 PM EST by a video-enabled telemedicine application and verified that I am speaking with the correct person using two identifiers.  Location: Patient: Virtual Visit Location  Patient: Home Provider: Virtual Visit Location Provider: Home Office   I discussed the limitations of evaluation and management by telemedicine and the availability of in person appointments. The patient expressed understanding and agreed to proceed.    History of Present Illness: Renee [name] is a 42 y.o. who identifies as a female who was assigned female at birth, and is being seen today for nausea and constant epigastric pain for the past 2 days, also with a unilateral (L) and constant headache associated with photophobia. Denies fever, chills. No vomiting or stool changes. Has history of chronic pancreatitis with frequent acute flares. Notes this feel identical to prior issues. Has been sipping fluids but not eating anything.  HPI: HPI  Problems:  Patient Active Problem List   Diagnosis Date Noted   Loud snoring 04/28/2022   Anxiety and depression 04/14/2022   Constipation 02/08/2022   History of vitamin D deficiency 01/26/2022   Risk factors for obstructive sleep apnea 01/26/2022   Positive screening for depression on 9-item Patient Health Questionnaire (PHQ-9) 01/26/2022   Encounter to establish care 01/26/2022   Elevated LFTs    Essential hypertension 01/12/2022   UTI (urinary tract infection) 01/12/2022   Pancreatic duct dilated    Acute on chronic pancreatitis ([place]) 01/11/2022   Hypercalcemia 02/19/2021   Elevated serum creatinine 02/19/2021   Screening mammogram for breast cancer 02/16/2021   Encounter for screening fecal occult blood testing 02/16/2021   Encounter for gynecological examination with Papanicolaou smear of cervix 02/16/2021  Cholelithiasis with chronic cholecystitis 04/13/2020   Cholelithiasis with cholecystitis 04/02/2020   Hyperparathyroidism, primary ([place]) 04/02/2020   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/2015    Allergies: No Known Allergies Medications:  Current Outpatient Medications:    amLODipine (NORVASC) 5 MG tablet, Take 1 tablet (5 mg total) by mouth  daily., Disp: 30 tablet, Rfl: 2   Cholecalciferol (DIALYVITE VITAMIN D 5000) 125 MCG (5000 UT) capsule, Take 5,000 Units by mouth daily., Disp: , Rfl:    fluticasone (FLONASE) 50 MCG/ACT nasal spray, Place 1 spray into both nostrils 2 (two) times daily. (Patient taking differently: Place 1 spray into both nostrils 2 (two) times daily as needed for allergies.), Disp: 16 g, Rfl: 2   hydrOXYzine (ATARAX) 10 MG tablet, Take 1 tablet (10 mg total) by mouth 3 (three) times daily as needed., Disp: 30 tablet, Rfl: 0   losartan (COZAAR) 100 MG tablet, Take 100 mg by mouth daily., Disp: , Rfl:    Multiple Vitamins-Minerals (MULTIVITAMIN WITH MINERALS) tablet, Take 1 tablet by mouth daily., Disp: , Rfl:    naproxen (NAPROSYN) 375 MG tablet, Take 1 tablet (375 mg total) by mouth daily., Disp: 30 tablet, Rfl: 4   ondansetron (ZOFRAN) 4 MG tablet, Take 1 tablet (4 mg total) by mouth every 6 (six) hours as needed for nausea., Disp: 20 tablet, Rfl: 0   sertraline (ZOLOFT) 50 MG tablet, TAKE 1 TABLET BY MOUTH EVERY DAY, Disp: 90 tablet, Rfl: 2  Observations/Objective: Patient is well-developed, well-nourished in no acute distress.  Resting in bed at home.  Head is normocephalic, atraumatic.  No labored breathing. Speech is clear and coherent with logical content.  Patient is alert and oriented at baseline.   Assessment and Plan: 1. Epigastric pain  2. Other migraine without status migrainosus, intractable  Giving significant symptoms and concern for acute pancreatitis giving history, she has been sent for an in person evaluation. She is aware that if anything worsens at all before she can be seen today, she needs an ER assessment.   Follow Up Instructions: I discussed the assessment and treatment plan with the patient. The patient was provided an opportunity to ask questions and all were answered. The patient agreed with the plan and demonstrated an understanding of the instructions.  A copy of instructions  were sent to the patient via MyChart unless otherwise noted below.   The patient was advised to call back or seek an in-person evaluation if the symptoms worsen or if the condition fails to improve as anticipated.  Time:  I spent 10 minutes with the patient via telehealth technology discussing the above problems/concerns.    Leeanne Rio, PA-C

## 2022-06-03 NOTE — Patient Instructions (Signed)
  Renee [name], thank you for joining Leeanne Rio, PA-C for today's virtual visit.  While this provider is not your primary care provider (PCP), if your PCP is located in our provider database this encounter information will be shared with them immediately following your visit.   [facility] account gives you access to today's visit and all your visits, tests, and labs performed at [facility] " click here if you don't have a [facility] account or go to [email or web address]  Consent: (Patient) Renee [name] provided verbal consent for this virtual visit at the beginning of the encounter.  Current Medications:  Current Outpatient Medications:    amLODipine (NORVASC) 5 MG tablet, Take 1 tablet (5 mg total) by mouth daily., Disp: 30 tablet, Rfl: 2   Cholecalciferol (DIALYVITE VITAMIN D 5000) 125 MCG (5000 UT) capsule, Take 5,000 Units by mouth daily., Disp: , Rfl:    fluticasone (FLONASE) 50 MCG/ACT nasal spray, Place 1 spray into both nostrils 2 (two) times daily. (Patient taking differently: Place 1 spray into both nostrils 2 (two) times daily as needed for allergies.), Disp: 16 g, Rfl: 2   hydrOXYzine (ATARAX) 10 MG tablet, Take 1 tablet (10 mg total) by mouth 3 (three) times daily as needed., Disp: 30 tablet, Rfl: 0   losartan (COZAAR) 100 MG tablet, Take 100 mg by mouth daily., Disp: , Rfl:    Multiple Vitamins-Minerals (MULTIVITAMIN WITH MINERALS) tablet, Take 1 tablet by mouth daily., Disp: , Rfl:    naproxen (NAPROSYN) 375 MG tablet, Take 1 tablet (375 mg total) by mouth daily., Disp: 30 tablet, Rfl: 4   ondansetron (ZOFRAN) 4 MG tablet, Take 1 tablet (4 mg total) by mouth every 6 (six) hours as needed for nausea., Disp: 20 tablet, Rfl: 0   sertraline (ZOLOFT) 50 MG tablet, TAKE 1 TABLET BY MOUTH EVERY DAY, Disp: 90 tablet, Rfl: 2   Medications ordered in this encounter:  No orders of the defined types were placed in this encounter.    *If  you need refills on other medications prior to your next appointment, please contact your pharmacy*  Follow-Up: Call back or seek an in-person evaluation if the symptoms worsen or if the condition fails to improve as anticipated.  [facility] [phone]  Other Instructions   If you have been instructed to have an in-person evaluation today at a local Urgent Care facility, please use the link below. It will take you to a list of all of our available [facility], including address, phone number and hours of operation. Please do not delay care.  [facility]  If you or a family member do not have a primary care provider, use the link below to schedule a visit and establish care. When you choose a [facility] primary care physician or advanced practice provider, you gain a long-term partner in health. Find a Primary Care Provider  Learn more about 's in-office and virtual care options: [facility]

## 2022-06-03 NOTE — ED Triage Notes (Signed)
Pt reports headache, nausea, abdominal pain, right ankle pain x 2 days. Pt took prescribed zofran but doesn't know what time.

## 2022-06-04 LAB — COMPREHENSIVE METABOLIC PANEL
ALT: 17 IU/L (ref 0–32)
AST: 18 IU/L (ref 0–40)
Albumin/Globulin Ratio: 1.7 (ref 1.2–2.2)
Albumin: 4.7 g/dL (ref 3.9–4.9)
Alkaline Phosphatase: 72 IU/L (ref 44–121)
BUN/Creatinine Ratio: 11 (ref 9–23)
BUN: 8 mg/dL (ref 6–24)
Bilirubin Total: 0.3 mg/dL (ref 0.0–1.2)
CO2: 18 mmol/L — ABNORMAL LOW (ref 20–29)
Calcium: 12.2 mg/dL — ABNORMAL HIGH (ref 8.7–10.2)
Chloride: 110 mmol/L — ABNORMAL HIGH (ref 96–106)
Creatinine, Ser: 0.72 mg/dL (ref 0.57–1.00)
Globulin, Total: 2.7 g/dL (ref 1.5–4.5)
Glucose: 107 mg/dL — ABNORMAL HIGH (ref 70–99)
Potassium: 4.8 mmol/L (ref 3.5–5.2)
Sodium: 142 mmol/L (ref 134–144)
Total Protein: 7.4 g/dL (ref 6.0–8.5)
eGFR: 108 mL/min/{1.73_m2} (ref >59)

## 2022-06-04 LAB — CBC
Hematocrit: 44.5 % (ref 34.0–46.6)
Hemoglobin: 14.4 g/dL (ref 11.1–15.9)
MCH: 27.8 pg (ref 26.6–33.0)
MCHC: 32.4 g/dL (ref 31.5–35.7)
MCV: 86 fL (ref 79–97)
Platelets: 300 10*3/uL (ref 150–450)
RBC: 5.18 x10E6/uL (ref 3.77–5.28)
RDW: 13.1 % (ref 11.7–15.4)
WBC: 4.2 10*3/uL (ref 3.4–10.8)

## 2022-06-04 LAB — LIPASE: Lipase: 20 U/L (ref 14–72)

## 2022-06-09 ENCOUNTER — Encounter: Payer: Self-pay | Admitting: Radiology

## 2022-06-10 ENCOUNTER — Ambulatory Visit: Payer: BC Managed Care – PPO | Admitting: Primary Care

## 2022-06-10 DIAGNOSIS — R0683 Snoring: Secondary | ICD-10-CM

## 2022-06-10 DIAGNOSIS — G4733 Obstructive sleep apnea (adult) (pediatric): Secondary | ICD-10-CM

## 2022-06-13 ENCOUNTER — Ambulatory Visit (HOSPITAL_COMMUNITY): Payer: BC Managed Care – PPO | Admitting: Clinical

## 2022-06-13 DIAGNOSIS — G4733 Obstructive sleep apnea (adult) (pediatric): Secondary | ICD-10-CM

## 2022-06-15 NOTE — Progress Notes (Signed)
Please let patient know HST 06/11/22 showed very mild OSA, AHI 5.8 apneas an hour. If having a lot of daytime sleepiness and wants to discuss starting CPAP schedule visit. Otherwise we can either refer to orthodontics for oral appliance and do watchful waiting approach with weight loss and focus on lateral sleep position

## 2022-06-23 ENCOUNTER — Ambulatory Visit: Payer: BC Managed Care – PPO | Admitting: Primary Care

## 2022-07-26 NOTE — Progress Notes (Deleted)
GI Office Note    Referring Provider: Billie Lade, MD Primary Care Physician:  Billie Lade, MD  Primary Gastroenterologist:  Chief Complaint   No chief complaint on file.   History of Present Illness   Renee [name] is a 42 y.o. female presenting today for follow up. Last seen in 01/2022. H/o acute on chronic pancreatitis/dilated pancreatic duct with atrophy of body and tail of pancreas due to ductal obstruction in center of pancreas. Chronic hypercalcemia may be contributing to the overall picture. History of hyerparathyroidism and advised need for surgery but previously unable to complete due to financial situation. ***did she f/u endo    EUS with Dr. Meridee Score 03/2022 with plans for f/u and ?ERCP. Noted to have gastritis and reflux esophagitis.   She did not make f/u appt with Dr. Meridee Score.  CT A/P with contrast 04/2022:  IMPRESSION: 1. No acute findings are noted in the abdomen or pelvis to account for the patient's symptoms. 2. Large calcification in the body of the pancreas with evidence of chronic proximal ductal obstruction and chronic atrophy throughout the body and tail of the pancreas, similar to prior studies, likely sequela of chronic pancreatitis. 3. Nonobstructive calculi in the right renal collecting system measuring up to 4 mm in the interpolar region. 4. New small right ovarian lesion, likely a hemorrhagic cyst. Follow-up pelvic ultrasound is recommended in 3-6 months to ensure the regression of this finding.  Medications   Current Outpatient Medications  Medication Sig Dispense Refill   amLODipine (NORVASC) 5 MG tablet Take 1 tablet (5 mg total) by mouth daily. 30 tablet 2   Cholecalciferol (DIALYVITE VITAMIN D 5000) 125 MCG (5000 UT) capsule Take 5,000 Units by mouth daily.     fluticasone (FLONASE) 50 MCG/ACT nasal spray Place 1 spray into both nostrils 2 (two) times daily. (Patient taking differently: Place 1 spray into both  nostrils 2 (two) times daily as needed for allergies.) 16 g 2   hydrOXYzine (ATARAX) 10 MG tablet Take 1 tablet (10 mg total) by mouth 3 (three) times daily as needed. 30 tablet 0   losartan (COZAAR) 100 MG tablet Take 100 mg by mouth daily.     Multiple Vitamins-Minerals (MULTIVITAMIN WITH MINERALS) tablet Take 1 tablet by mouth daily.     naproxen (NAPROSYN) 375 MG tablet Take 1 tablet (375 mg total) by mouth daily. 30 tablet 4   ondansetron (ZOFRAN) 4 MG tablet Take 1 tablet (4 mg total) by mouth every 6 (six) hours as needed for nausea. 20 tablet 0   ondansetron (ZOFRAN) 4 MG tablet Take 1 tablet (4 mg total) by mouth every 6 (six) hours. 12 tablet 0   sertraline (ZOLOFT) 50 MG tablet TAKE 1 TABLET BY MOUTH EVERY DAY 90 tablet 2   No current facility-administered medications for this visit.    Allergies   Allergies as of 07/27/2022   (No Known Allergies)     Past Medical History   Past Medical History:  Diagnosis Date   Elevated serum creatinine 02/19/2021   Herpes simplex virus (HSV) infection    no OB +HSV 05/04/16 at [facility]    Hypertension    Hypothyroidism    Vaginal Pap smear, abnormal     Past Surgical History   Past Surgical History:  Procedure Laterality Date   BIOPSY  03/31/2022   Procedure: BIOPSY;  Surgeon: Lemar Lofty., MD;  Location: [facility];  Service: Gastroenterology;;   CHOLECYSTECTOMY N/A 04/13/2020  Procedure: LAPAROSCOPIC CHOLECYSTECTOMY WITH INTRAOPERATIVE CHOLANGIOGRAM;  Surgeon: Darnell Level, MD;  Location: [facility];  Service: General;  Laterality: N/A;   ESOPHAGOGASTRODUODENOSCOPY (EGD) WITH PROPOFOL N/A 03/31/2022   Procedure: ESOPHAGOGASTRODUODENOSCOPY (EGD) WITH PROPOFOL;  Surgeon: Lemar Lofty., MD;  Location: [facility];  Service: Gastroenterology;  Laterality: N/A;   EUS N/A 03/31/2022   Procedure: UPPER ENDOSCOPIC ULTRASOUND (EUS) LINEAR;  Surgeon: Lemar Lofty., MD;  Location: [facility];  Service:  Gastroenterology;  Laterality: N/A;   KNEE SURGERY     TONSILLECTOMY      Past Family History   Family History  Problem Relation Age of Onset   Congestive Heart Failure Maternal Grandmother    Hypertension Maternal Grandmother    Diabetes Maternal Grandmother    Other Maternal Grandfather        aneursym   Kidney disease Maternal Grandfather        on dialysis   Congestive Heart Failure Maternal Grandfather    Diabetes Maternal Grandfather     Past Social History   Social History   Socioeconomic History   Marital status: Single    Spouse name: Not on file   Number of children: Not on file   Years of education: Not on file   Highest education level: Not on file  Occupational History   Not on file  Tobacco Use   Smoking status: Former    Packs/day: 0.25    Years: 17.00    Additional pack years: 0.00    Total pack years: 4.25    Types: Cigarettes    Quit date: 01/09/2022    Years since quitting: 0.5   Smokeless tobacco: Never   Tobacco comments:    smokes 2-3 cig daily  Vaping Use   Vaping Use: Never used  Substance and Sexual Activity   Alcohol use: Not Currently    Comment: socially   Drug use: Yes    Types: Marijuana    Comment: occasionally   Sexual activity: Yes    Birth control/protection: None  Other Topics Concern   Not on file  Social History Narrative   Not on file   Social Determinants of Health   Financial Resource Strain: Medium Risk (02/16/2021)   Overall Financial Resource Strain (CARDIA)    Difficulty of Paying Living Expenses: Somewhat hard  Food Insecurity: No Food Insecurity (01/11/2022)   Hunger Vital Sign    Worried About Running Out of Food in the Last Year: Never true    Ran Out of Food in the Last Year: Never true  Transportation Needs: No Transportation Needs (01/11/2022)   PRAPARE - Administrator, Civil Service (Medical): No    Lack of Transportation (Non-Medical): No  Physical Activity: Inactive (02/16/2021)    Exercise Vital Sign    Days of Exercise per Week: 0 days    Minutes of Exercise per Session: 0 min  Stress: No Stress Concern Present (02/16/2021)   [employer] of Occupational Health - Occupational Stress Questionnaire    Feeling of Stress : Only a little  Social Connections: Moderately Isolated (02/16/2021)   Social Connection and Isolation Panel [NHANES]    Frequency of Communication with Friends and Family: Three times a week    Frequency of Social Gatherings with Friends and Family: Twice a week    Attends Religious Services: 1 to 4 times per year    Active Member of [employer] or Organizations: No    Attends Banker Meetings: Never    Marital  Status: Divorced  Catering manager Violence: Not At Risk (01/11/2022)   Humiliation, Afraid, Rape, and Kick questionnaire    Fear of Current or Ex-Partner: No    Emotionally Abused: No    Physically Abused: No    Sexually Abused: No    Review of Systems   General: Negative for anorexia, weight loss, fever, chills, fatigue, weakness. ENT: Negative for hoarseness, difficulty swallowing , nasal congestion. CV: Negative for chest pain, angina, palpitations, dyspnea on exertion, peripheral edema.  Respiratory: Negative for dyspnea at rest, dyspnea on exertion, cough, sputum, wheezing.  GI: See history of present illness. GU:  Negative for dysuria, hematuria, urinary incontinence, urinary frequency, nocturnal urination.  Endo: Negative for unusual weight change.     Physical Exam   There were no vitals taken for this visit.   General: Well-nourished, well-developed in no acute distress.  Eyes: No icterus. Mouth: Oropharyngeal mucosa moist and pink , no lesions erythema or exudate. Lungs: Clear to auscultation bilaterally.  Heart: Regular rate and rhythm, no murmurs rubs or gallops.  Abdomen: Bowel sounds are normal, nontender, nondistended, no hepatosplenomegaly or masses,  no abdominal bruits or hernia , no rebound or  guarding.  Rectal: ***  Extremities: No lower extremity edema. No clubbing or deformities. Neuro: Alert and oriented x 4   Skin: Warm and dry, no jaundice.   Psych: Alert and cooperative, normal mood and affect.  Labs   Lab Results  Component Value Date   LIPASE 20 06/03/2022   Lab Results  Component Value Date   CREATININE 0.72 06/03/2022   BUN 8 06/03/2022   NA 142 06/03/2022   K 4.8 06/03/2022   CL 110 (H) 06/03/2022   CO2 18 (L) 06/03/2022   Lab Results  Component Value Date   ALT 17 06/03/2022   AST 18 06/03/2022   ALKPHOS 72 06/03/2022   BILITOT 0.3 06/03/2022   Lab Results  Component Value Date   WBC 4.2 06/03/2022   HGB 14.4 06/03/2022   HCT 44.5 06/03/2022   MCV 86 06/03/2022   PLT 300 06/03/2022    Imaging Studies   No results found.  Assessment       PLAN   ***   Leanna Battles. Melvyn Neth, MHS, PA-C [facility]

## 2022-07-27 ENCOUNTER — Ambulatory Visit: Payer: BC Managed Care – PPO | Admitting: Gastroenterology

## 2022-07-27 ENCOUNTER — Encounter: Payer: Self-pay | Admitting: Gastroenterology

## 2022-08-31 ENCOUNTER — Telehealth (INDEPENDENT_AMBULATORY_CARE_PROVIDER_SITE_OTHER): Payer: BC Managed Care – PPO | Admitting: Primary Care

## 2022-08-31 DIAGNOSIS — G4733 Obstructive sleep apnea (adult) (pediatric): Secondary | ICD-10-CM | POA: Diagnosis not present

## 2022-08-31 DIAGNOSIS — G4730 Sleep apnea, unspecified: Secondary | ICD-10-CM

## 2022-08-31 NOTE — Patient Instructions (Signed)

## 2022-08-31 NOTE — Progress Notes (Signed)
Virtual Visit via Video Note  I connected with Renee [name] on 08/31/22 at  3:00 PM EDT by a video enabled telemedicine application and verified that I am speaking with the correct person using two identifiers.  Location: Patient: Home Provider: Office   I discussed the limitations of evaluation and management by telemedicine and the availability of in person appointments. The patient expressed understanding and agreed to proceed.  History of Present Illness: 42 year old female, former smoker.  Past medical history significant for hypertension, acute on chronic pancreatitis, hyperparathyroidism, vitamin D deficiency.   Previous [facility] pulmonary encounter: 04/28/2022 Patient has symptoms of snoring, waking up gasping for air during sleep, frequent nocturnal awakenings, nonrestorative sleep and daytime sleepiness. She works second shift and gets home around midnight. She works in a [employer].  She does not operate heavy machinery.  Typical bedtime is 4 AM. It can take her an hour to fall asleep.  She wakes up about twice a night.  She starts her day at 12 PM. Despite sleep position she has persistent snoring. She had her adenoids removed at a young age. Weight is down 20 pounds in the last 2 years.  She has chronic pancreatitis and has had to change her diet. No previous sleep study.  Epworth score 12.  Denies symptoms of narcolepsy, cataplexy or sleepwalking.  Sleep questionnaire Symptoms-  snoring, waking up gasping for air, non-restorative sleep, daytime sleepiness  Prior sleep study- none Bedtime- 4am Time to fall asleep- an hour Nocturnal awakenings- twice/night  Out of bed/start of day- 12pm Weight changes- down 20 lbs Do you operate heavy machinery- no Do you currently wear CPAP- no Do you current wear oxygen- no Epworth- 12    08/31/2022 Patient was contacted today to review sleep study.  Patient had home sleep study on 07/01/2022 that showed mild obstructive sleep apnea, AHI 5.8 an  hour with SpO2 low 85% (average 94%).  Patient spent 1.2 minutes with oxygen level less than 90%.  We reviewed sleep study results today and treatment options.  Due to mild severity of her sleep apnea CPAP is not necessarily unless patient is symptomatic.  Alternative treatment options were discussed including watchful waiting, weight loss, positional sleep, oral appliance or referral to ENT.  Patient will consider these options and notify us if she wants referral to dentist for oral appliance or trial CPAP.  For now we encouraged weight loss efforts and side sleeping position.   Observations/Objective:  - No overt respiratory symptoms, able to speak in full sentences   Assessment and Plan:  Mild OSA - HST 06/11/22>> very mild OSA, AHI 5.8/hour with spo2 low 85% (average 94%). Reviewed treatment options including weight loss, oral appliance, CPAP therapy or referral to ENT for possible surgical options. Risk for cardiovascular disease is minimal with mild OSA. Ok to conservatively manage with watchful waiting. Encourage side sleeping position and weight loss. Advised against alcohol use/sedatives prior to bedtime. She will consider oral appliance vs CPAP and notify office if she want to pursue either of these treatment options.  Follow Up Instructions:   - As needed if symptoms worsen  I discussed the assessment and treatment plan with the patient. The patient was provided an opportunity to ask questions and all were answered. The patient agreed with the plan and demonstrated an understanding of the instructions.   The patient was advised to call back or seek an in-person evaluation if the symptoms worsen or if the condition fails to improve as anticipated.  I provided 22 minutes of non-face-to-face time during this encounter.   Glenford Bayley, NP

## 2022-09-01 NOTE — Progress Notes (Signed)
Reviewed and agree with assessment/plan.   Felton Buczynski, MD [facility] 09/01/2022, 7:05 AM Pager:  [phone]  

## 2022-09-14 ENCOUNTER — Encounter: Payer: Self-pay | Admitting: Emergency Medicine

## 2022-09-14 ENCOUNTER — Ambulatory Visit
Admission: EM | Admit: 2022-09-14 | Discharge: 2022-09-14 | Disposition: A | Discharge status: Home / Self Care | Payer: BC Managed Care – PPO | Attending: Emergency Medicine | Admitting: Emergency Medicine

## 2022-09-14 DIAGNOSIS — I10 Essential (primary) hypertension: Secondary | ICD-10-CM | POA: Diagnosis not present

## 2022-09-14 DIAGNOSIS — R519 Headache, unspecified: Secondary | ICD-10-CM

## 2022-09-14 HISTORY — DX: Acute pancreatitis without necrosis or infection, unspecified: K85.90

## 2022-09-14 MED ORDER — LOSARTAN POTASSIUM 100 MG PO TABS: 100.0000 mg | ORAL_TABLET | Freq: Every day | ORAL | 2 refills | Status: DC

## 2022-09-14 MED ORDER — KETOROLAC TROMETHAMINE 30 MG/ML IJ SOLN
30.0000 mg | Freq: Once | INTRAMUSCULAR | Status: AC
  Administered 2022-09-14: 30 mg via INTRAMUSCULAR

## 2022-09-14 MED ORDER — DEXAMETHASONE SODIUM PHOSPHATE 10 MG/ML IJ SOLN
10.0000 mg | Freq: Once | INTRAMUSCULAR | Status: AC
  Administered 2022-09-14: 10 mg via INTRAMUSCULAR

## 2022-09-14 MED ORDER — ONDANSETRON 4 MG PO TBDP: 4.0000 mg | ORAL_TABLET | Freq: Three times a day (TID) | ORAL | 0 refills | Status: DC | PRN

## 2022-09-14 MED ORDER — IBUPROFEN 800 MG PO TABS: 800.0000 mg | ORAL_TABLET | Freq: Three times a day (TID) | ORAL | 0 refills | Status: DC

## 2022-09-14 MED ORDER — METOCLOPRAMIDE HCL 5 MG/ML IJ SOLN
5.0000 mg | Freq: Once | INTRAMUSCULAR | Status: AC
  Administered 2022-09-14: 5 mg via INTRAMUSCULAR

## 2022-09-14 MED ORDER — AMLODIPINE BESYLATE 5 MG PO TABS: 5.0000 mg | ORAL_TABLET | Freq: Every day | ORAL | 2 refills | Status: DC

## 2022-09-14 NOTE — ED Provider Notes (Signed)
[facility]    CSN: [id number] Arrival date & time: 09/14/22  0915      History   Chief Complaint No chief complaint on file.   HPI Renee [name] is a 42 y.o. female.   Patient presents for evaluation of a frontal headache beginning 1 day ago.  Initially present on the left side now primarily to the right, headache has been constant, fluctuating in severity.  Associated photophobia, phonophobia and nausea without vomiting.  Has not occurred before.  Has not taken blood pressure medication in 1 month, prescribed amlodipine and losartan, unable to fill prescription due to financial strain.  Does not monitor blood pressure at home.  Has attempted use of Tylenol and ibuprofen which have been ineffective.  Denies dizziness, lightheadedness, visual changes, chest pain, shortness of breath.   Past Medical History:  Diagnosis Date   Elevated serum creatinine 02/19/2021   Herpes simplex virus (HSV) infection    no OB +HSV 05/04/16 at [facility]    Hypertension    Hypothyroidism    Pancreatitis    Vaginal Pap smear, abnormal     Patient Active Problem List   Diagnosis Date Noted   Loud snoring 04/28/2022   Anxiety and depression 04/14/2022   Constipation 02/08/2022   History of vitamin D deficiency 01/26/2022   Risk factors for obstructive sleep apnea 01/26/2022   Positive screening for depression on 9-item Patient Health Questionnaire (PHQ-9) 01/26/2022   Encounter to establish care 01/26/2022   Elevated LFTs    Essential hypertension 01/12/2022   UTI (urinary tract infection) 01/12/2022   Pancreatic duct dilated    Acute on chronic pancreatitis (HCC) 01/11/2022   Hypercalcemia 02/19/2021   Elevated serum creatinine 02/19/2021   Screening mammogram for breast cancer 02/16/2021   Encounter for screening fecal occult blood testing 02/16/2021   Encounter for gynecological examination with Papanicolaou smear of cervix 02/16/2021   Cholelithiasis with chronic cholecystitis  04/13/2020   Cholelithiasis with cholecystitis 04/02/2020   Hyperparathyroidism, primary (HCC) 04/02/2020   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/2015    Past Surgical History:  Procedure Laterality Date   BIOPSY  03/31/2022   Procedure: BIOPSY;  Surgeon: Lemar Lofty., MD;  Location: [facility];  Service: Gastroenterology;;   CHOLECYSTECTOMY N/A 04/13/2020   Procedure: LAPAROSCOPIC CHOLECYSTECTOMY WITH INTRAOPERATIVE CHOLANGIOGRAM;  Surgeon: Darnell Level, MD;  Location: [facility];  Service: General;  Laterality: N/A;   ESOPHAGOGASTRODUODENOSCOPY (EGD) WITH PROPOFOL N/A 03/31/2022   Procedure: ESOPHAGOGASTRODUODENOSCOPY (EGD) WITH PROPOFOL;  Surgeon: Lemar Lofty., MD;  Location: [facility];  Service: Gastroenterology;  Laterality: N/A;   EUS N/A 03/31/2022   Procedure: UPPER ENDOSCOPIC ULTRASOUND (EUS) LINEAR;  Surgeon: Lemar Lofty., MD;  Location: [facility];  Service: Gastroenterology;  Laterality: N/A;   KNEE SURGERY     TONSILLECTOMY      OB History     Gravida  2   Para      Term      Preterm      AB  2   Living         SAB      IAB      Ectopic      Multiple      Live Births               Home Medications    Prior to Admission medications   Medication Sig Start Date End Date Taking? Authorizing Provider  amLODipine (NORVASC) 5 MG tablet Take 1 tablet (5 mg  total) by mouth daily. 02/14/22 05/19/22  Billie Lade, MD  losartan (COZAAR) 100 MG tablet Take 100 mg by mouth daily.    [provider]    Family History Family History  Problem Relation Age of Onset   Congestive Heart Failure Maternal Grandmother    Hypertension Maternal Grandmother    Diabetes Maternal Grandmother    Other Maternal Grandfather        aneursym   Kidney disease Maternal Grandfather        on dialysis   Congestive Heart Failure Maternal Grandfather    Diabetes Maternal Grandfather     Social History Social History   Tobacco Use    Smoking status: Some Days    Packs/day: 0.25    Years: 17.00    Additional pack years: 0.00    Total pack years: 4.25    Types: Cigarettes    Last attempt to quit: 01/09/2022    Years since quitting: 0.6   Smokeless tobacco: Never   Tobacco comments:    smokes 2-3 cig daily  Vaping Use   Vaping Use: Never used  Substance Use Topics   Alcohol use: Not Currently    Comment: socially   Drug use: Not Currently    Types: Marijuana    Comment: occasionally     Allergies   Patient has no known allergies.   Review of Systems Review of Systems DEFER TO Hpi    Physical Exam Triage Vital Signs ED Triage Vitals  Enc Vitals Group     BP 09/14/22 0947 (!) 165/117     Pulse Rate 09/14/22 0947 76     Resp 09/14/22 0947 18     Temp 09/14/22 0947 98.3 F (36.8 C)     Temp Source 09/14/22 0947 Oral     SpO2 09/14/22 0947 99 %     Weight --      Height --      Head Circumference --      Peak Flow --      Pain Score 09/14/22 0948 10     Pain Loc --      Pain Edu? --      Excl. in GC? --    No data found.  Updated Vital Signs BP (!) 165/117 (BP Location: Right Arm)   Pulse 76   Temp 98.3 F (36.8 C) (Oral)   Resp 18   LMP 09/07/2022 (Exact Date)   SpO2 99%   Visual Acuity Right Eye Distance:   Left Eye Distance:   Bilateral Distance:    Right Eye Near:   Left Eye Near:    Bilateral Near:     Physical Exam Constitutional:      Appearance: Normal appearance.  Eyes:     Extraocular Movements: Extraocular movements intact.     Conjunctiva/sclera: Conjunctivae normal.     Pupils: Pupils are equal, round, and reactive to light.  Pulmonary:     Effort: Pulmonary effort is normal.  Neurological:     General: No focal deficit present.     Mental Status: She is alert and oriented to person, place, and time. Mental status is at baseline.     Cranial Nerves: No cranial nerve deficit.     Sensory: No sensory deficit.     Motor: No weakness.     Gait: Gait normal.       UC Treatments / Results  Labs (all labs ordered are listed, but only abnormal results are displayed) Labs Reviewed -  No data to display  EKG   Radiology No results found.  Procedures Procedures (including critical care time)  Medications Ordered in UC Medications - No data to display  Initial Impression / Assessment and Plan / UC Course  I have reviewed the triage vital signs and the nursing notes.  Pertinent labs & imaging results that were available during my care of the patient were reviewed by me and considered in my medical decision making (see chart for details).  Bad headache, elevated blood pressure reading in office with diagnosis of hypertension  Blood pressure in triage 165/117, while ill-appearing patient is in no signs of distress or toxic, waiting in exam room with lights off due to light sensitivity, no neurological abnormalities present on exam, headache is most likely secondary to hypertension, discussed this with patient, medications refilled and sent to the cheapest pharmacy within the area, advised that when she gets money to refill the medicine also advised to notify PCP of financial strain, given injection of Toradol, Decadron and Reglan here in office, prescribed ibuprofen and Zofran for outpatient management, given strict precautions for any signs of hypertensive urgency or worsening headache and severity to go to the nearest emergency department for immediate evaluation Final Clinical Impressions(s) / UC Diagnoses   Final diagnoses:  None   Discharge Instructions   None    ED Prescriptions   None    PDMP not reviewed this encounter.   Valinda Hoar, NP 09/14/22 1024

## 2022-09-14 NOTE — ED Triage Notes (Signed)
Headache and nausea since yesterday.  States she is out of her BP  medication amlodipine and losartan for 1 month.

## 2022-09-14 NOTE — Discharge Instructions (Addendum)
For your headache -Likely related to your blood pressure, elevated in this office today at 165/117 -Your blood pressure medication has been sent to [facility] which has the cheapest price currently, medicine will be approximately $9 each, please reach out to your primary care doctor and let them know that you are having financial concerns about the price as they may be able to find resources to help make it more affordable -On exam there are no abnormalities neurologically -You have been given an injection of Toradol, Reglan and Decadron here in the office today to help minimize your symptoms -You may continue use of ibuprofen every 8 hours and Tylenol as needed for pain, may use Zofran every 8 hours for management of nausea -While headaches are present ensure that you are getting adequate rest and adequate fluid intake -Participate in low stimulation activities avoiding bright lights and loud noises when symptoms are present -If your headaches continue to persist please follow-up with your primary doctor for reevaluation -At any point if you have the worst headache of your life please go to the nearest emergency department for immediate evaluation -At any point your headache is present and you begin to have dizziness, lightheadedness, visual changes, shortness of breath or chest pain please go to the nearest emergency department for immediate evaluation

## 2022-10-29 IMAGING — CT CT NECK SOFT TISSUE WO/W CM
4 of 10 series · 10 of 33 positions shown, 11 images · non-contrast
Comparison: Nuclear medicine parathyroid study with SPECT
05/19/2020

CLINICAL DATA: Hyperparathyroidism. Elevated calcium levels.
Negative sestamibi exam.

EXAM:
CT NECK WITH AND WITHOUT CONTRAST
TECHNIQUE: Multidetector CT imaging of the neck was performed without and with
intravenous contrast.

[Series 8: neck arterial 2.00 br40 s3 · coronal · arterial · 0.45mm/px · 1 of 139 slices shown]
[im 70/139  bone]
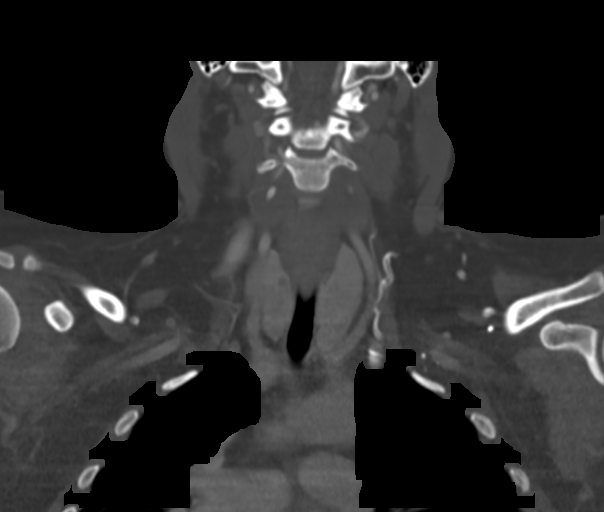

[Series 12: neck arterial 1.00 br40 s3 · axial · arterial · 0.56mm/px · z∈[-754,-678]mm · 2 of 230 slices shown, 3 images]
[im 77/230  soft-tissue]
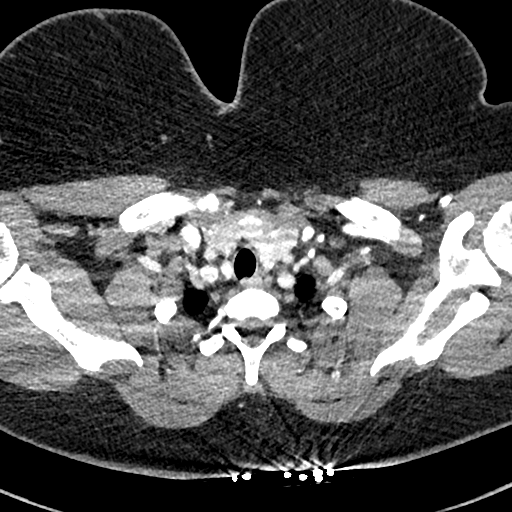
[im 77/230  bone]
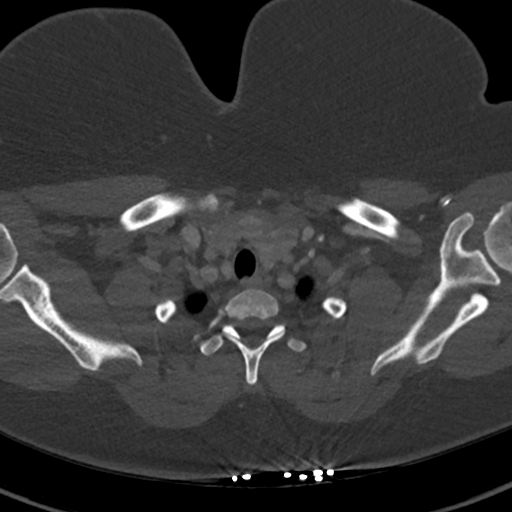
[im 153/230  bone]
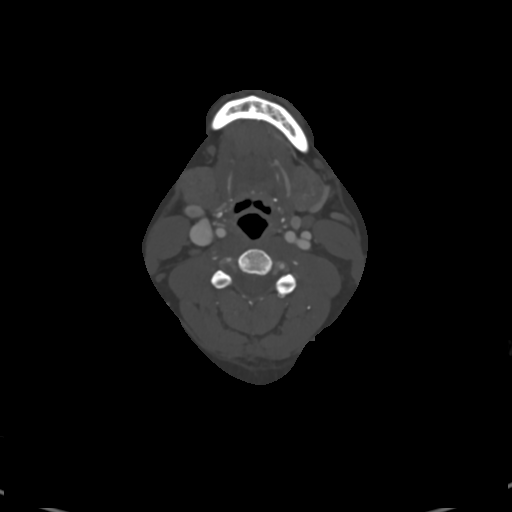

[Series 17: neck venous 2.00 br40 s3 · sagittal · portal-venous · 0.45mm/px · 5 of 138 slices shown]
[im 23/138  bone]
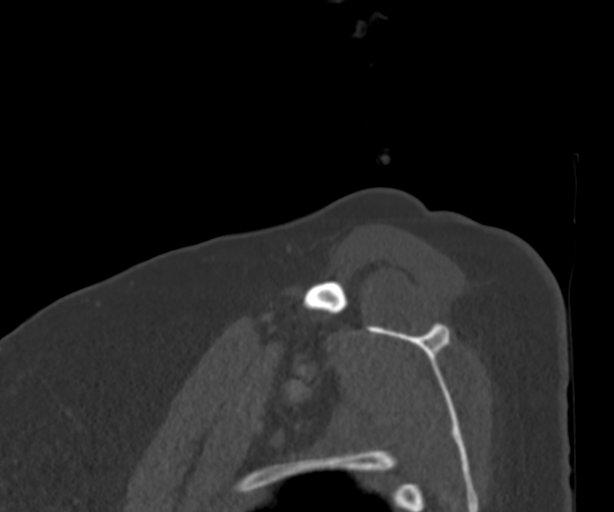
[im 46/138  bone]
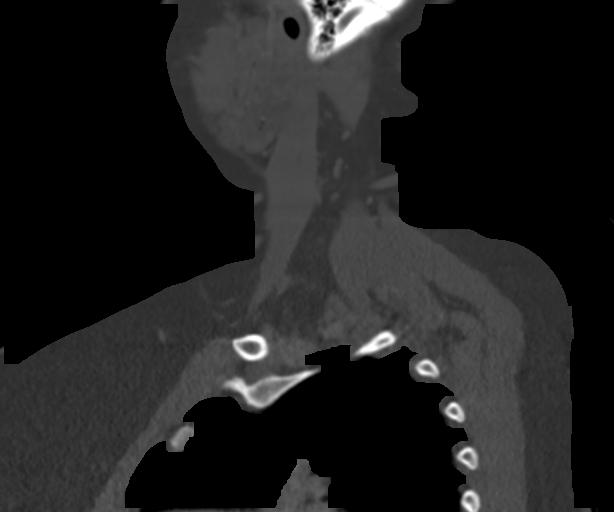
[im 69/138  bone]
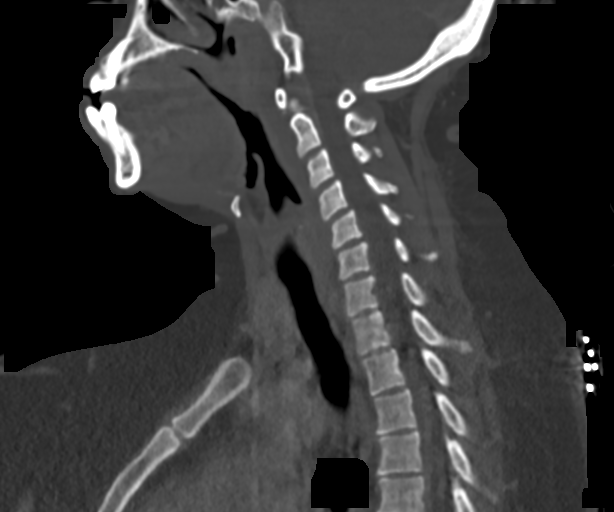
[im 92/138  bone]
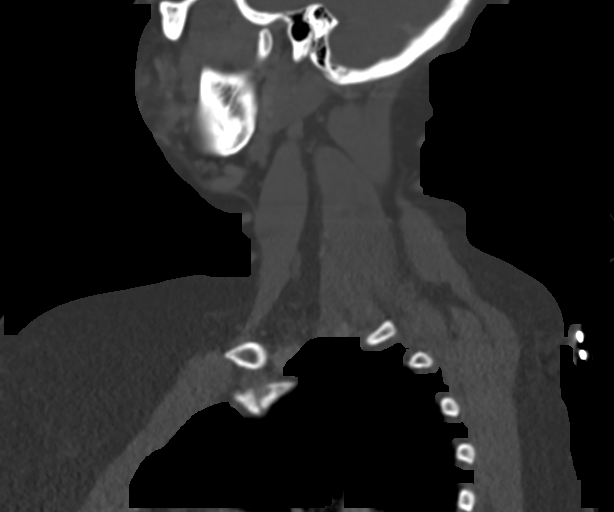
[im 115/138  bone]
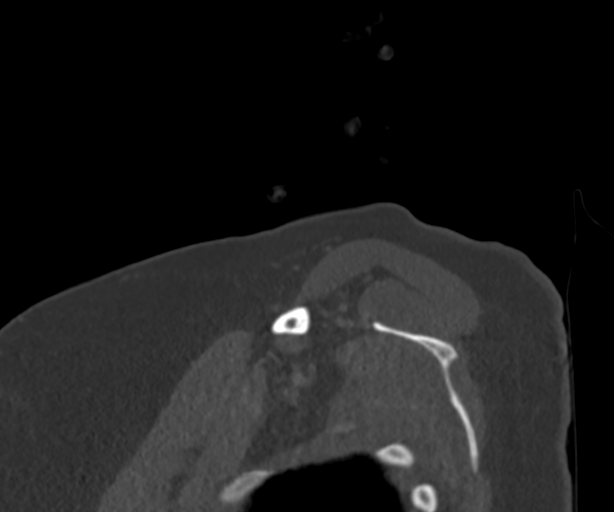

[Series 19: neck venous 1.00 br40 s3 · axial · portal-venous · 0.54mm/px · z∈[-754,-678]mm · 2 of 230 slices shown]
[im 77/230  bone]
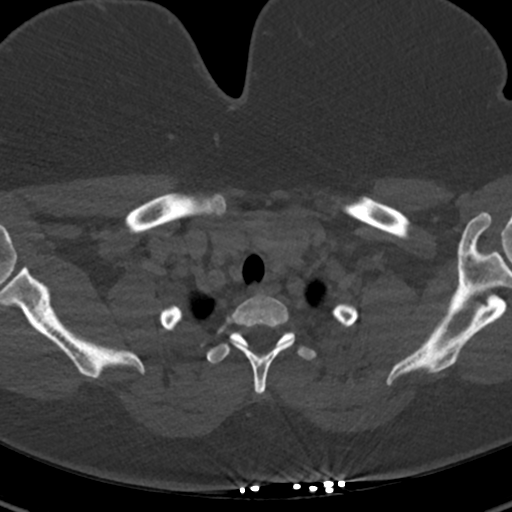
[im 153/230  bone]
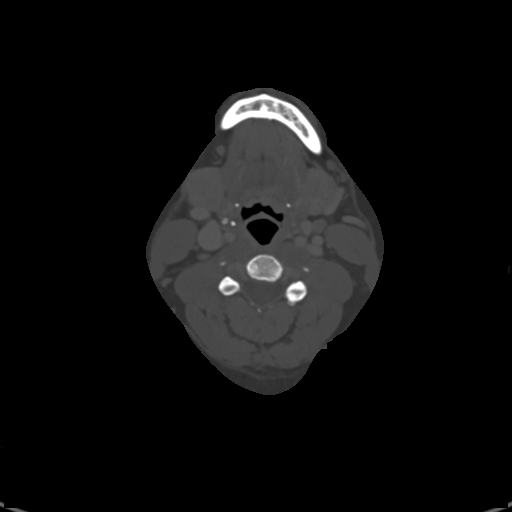

[10 of 33 positions shown; findings below may reference images not displayed]

RADIATION DOSE REDUCTION: This exam was performed according to the
departmental dose-optimization program which includes automated
exposure control, adjustment of the mA and/or kV according to
patient size and/or use of iterative reconstruction technique.

Noncontrast, arterial phase, and delayed venous phase imaging was
performed through the neck.

CONTRAST:  75mL 40CEQS-9A1 IOPAMIDOL (40CEQS-9A1) INJECTION 76%
FINDINGS: Pharynx and larynx: No focal mucosal or submucosal lesions are
present. Nasopharynx is within normal limits. Soft palate and tongue
base are normal. Vallecula and epiglottis are within normal limits.
Aryepiglottic folds and piriform sinuses are clear. Vocal cords are
midline and symmetric. Trachea is clear.

Salivary glands: The submandibular and parotid glands and ducts are
within normal limits.

Thyroid: 2 small hypodense nodules are present at the upper poles of
both lobes, best seen on arterial images, measuring 8 and 5 mm
respectively. A pedunculated component at the lower pole of the
right lobe demonstrates homogeneous enhancement on all phases.

A separate nodule is present posterior to the right lobe of the
thyroid measuring 9 x 9 x 14 mm. This nodule demonstrates slight
hyperintensity on the arterial phase images and is markedly
hypodense to the remainder of the thyroid on the delayed venous
images. No other nodules demonstrate a similar pattern to suggest
other parathyroid adenomas.

Lymph nodes: None enlarged or abnormal density.

Vascular: No significant vascular lesions are present.

Limited intracranial: Within normal limits.

Mastoids and visualized paranasal sinuses: The paranasal sinuses and
mastoid air cells are clear.

Skeleton: No acute or aggressive process.

Upper chest: Benign cyst in the right upper lobe measures 14 mm. No
follow-up recommended. Lung apices are otherwise clear. Thoracic
inlet is within normal limits.
IMPRESSION: 1. 9 x 9 x 14 mm nodule posterior to the right lobe of the thyroid.
This nodule demonstrates slight hyperintensity on the arterial phase
images and is markedly hypodense to the remainder of the thyroid on
the delayed venous images. The findings are consistent with a
parathyroid adenoma, likely upper pole.
2. Two small hypodense nodules are present at the upper poles of
both lobes of the thyroid, best seen on arterial images, measuring 8
and 5 mm respectively. Not clinically significant; no follow-up
imaging recommended (ref: [HOSPITAL]. [DATE]): 143-50).
3. Benign cyst in the right upper lobe measures 14 mm.

## 2022-12-05 ENCOUNTER — Ambulatory Visit: Payer: BC Managed Care – PPO

## 2022-12-10 ENCOUNTER — Ambulatory Visit: Payer: BC Managed Care – PPO

## 2022-12-20 ENCOUNTER — Telehealth: Payer: Self-pay | Admitting: Orthopaedic Surgery

## 2022-12-20 ENCOUNTER — Other Ambulatory Visit: Payer: Self-pay

## 2022-12-20 ENCOUNTER — Telehealth: Payer: Self-pay | Admitting: Internal Medicine

## 2022-12-20 MED ORDER — LOSARTAN POTASSIUM 100 MG PO TABS: 100.0000 mg | ORAL_TABLET | Freq: Every day | ORAL | 0 refills | Status: DC

## 2022-12-20 MED ORDER — NAPROXEN 375 MG PO TABS: 375.0000 mg | ORAL_TABLET | Freq: Two times a day (BID) | ORAL | 5 refills | Status: DC

## 2022-12-20 NOTE — Telephone Encounter (Signed)
Pt appt scheduled 10/24 also added to wait list for sooner appt   Pt wants to know if she can be seen by another provider in office to have meds refilled since current appointment is pushed out. Wants a call back in regard.

## 2022-12-20 NOTE — Telephone Encounter (Signed)
Dr. Sanjuan Dame pt - pt is requesting a refill on Naproxen 375 to be sent to [facility]

## 2022-12-20 NOTE — Telephone Encounter (Signed)
Patient called in needing note stating she is under the Doctors care     Prescription Request  12/20/2022  LOV: 05/19/2022  What is the name of the medication or equipment? losartan (COZAAR) 100 MG tablet [[id number]]    Have you contacted your pharmacy to request a refill? No   Which pharmacy would you like this sent to?   [facility] [address], [place] - [address] [place] [address] Phone: [phone] Fax: [phone]    Patient notified that their request is being sent to the clinical staff for review and that they should receive a response within 2 business days.   Please advise at Mobile [phone] (mobile)

## 2022-12-20 NOTE — Telephone Encounter (Signed)
LVM for pt to call and schedule appt with office for refill

## 2022-12-20 NOTE — Telephone Encounter (Signed)
30 day supply sent to pharmacy.  

## 2023-01-04 ENCOUNTER — Other Ambulatory Visit: Payer: Self-pay

## 2023-01-04 ENCOUNTER — Encounter (HOSPITAL_COMMUNITY): Payer: Self-pay | Admitting: Emergency Medicine

## 2023-01-04 ENCOUNTER — Emergency Department (HOSPITAL_COMMUNITY)
Admission: EM | Admit: 2023-01-04 | Discharge: 2023-01-04 | Disposition: A | Discharge status: Home / Self Care | Payer: Self-pay | Attending: Emergency Medicine | Admitting: Emergency Medicine

## 2023-01-04 DIAGNOSIS — Z79899 Other long term (current) drug therapy: Secondary | ICD-10-CM | POA: Insufficient documentation

## 2023-01-04 DIAGNOSIS — E039 Hypothyroidism, unspecified: Secondary | ICD-10-CM | POA: Insufficient documentation

## 2023-01-04 DIAGNOSIS — F17210 Nicotine dependence, cigarettes, uncomplicated: Secondary | ICD-10-CM | POA: Insufficient documentation

## 2023-01-04 DIAGNOSIS — R42 Dizziness and giddiness: Secondary | ICD-10-CM | POA: Diagnosis present

## 2023-01-04 DIAGNOSIS — I10 Essential (primary) hypertension: Secondary | ICD-10-CM | POA: Insufficient documentation

## 2023-01-04 LAB — BASIC METABOLIC PANEL
Anion gap: 7 (ref 5–15)
BUN: 16 mg/dL (ref 6–20)
CO2: 26 mmol/L (ref 22–32)
Calcium: 11.5 mg/dL — ABNORMAL HIGH (ref 8.9–10.3)
Chloride: 104 mmol/L (ref 98–111)
Creatinine, Ser: 0.89 mg/dL (ref 0.44–1.00)
GFR, Estimated: >60 mL/min (ref >60)
Glucose, Bld: 120 mg/dL — ABNORMAL HIGH (ref 70–99)
Potassium: 4 mmol/L (ref 3.5–5.1)
Sodium: 137 mmol/L (ref 135–145)

## 2023-01-04 LAB — URINALYSIS, ROUTINE W REFLEX MICROSCOPIC
Bilirubin Urine: NEGATIVE
Glucose, UA: NEGATIVE mg/dL
Ketones, ur: NEGATIVE mg/dL
Leukocytes,Ua: NEGATIVE
Nitrite: NEGATIVE
Protein, ur: NEGATIVE mg/dL
Specific Gravity, Urine: 1.011 (ref 1.005–1.030)
pH: 6 (ref 5.0–8.0)

## 2023-01-04 LAB — CBC
HCT: 39 % (ref 36.0–46.0)
Hemoglobin: 12.9 g/dL (ref 12.0–15.0)
MCH: 29.1 pg (ref 26.0–34.0)
MCHC: 33.1 g/dL (ref 30.0–36.0)
MCV: 87.8 fL (ref 80.0–100.0)
Platelets: 294 10*3/uL (ref 150–400)
RBC: 4.44 MIL/uL (ref 3.87–5.11)
RDW: 12.7 % (ref 11.5–15.5)
WBC: 6.2 10*3/uL (ref 4.0–10.5)
nRBC: 0 % (ref 0.0–0.2)

## 2023-01-04 LAB — TROPONIN I (HIGH SENSITIVITY)
Troponin I (High Sensitivity): <2 ng/L (ref <18)
Troponin I (High Sensitivity): <2 ng/L (ref <18)

## 2023-01-04 LAB — PREGNANCY, URINE: Preg Test, Ur: NEGATIVE

## 2023-01-04 MED ORDER — AMLODIPINE BESYLATE 5 MG PO TABS: 5.0000 mg | ORAL_TABLET | Freq: Every day | ORAL | 0 refills | Status: DC

## 2023-01-04 MED ORDER — AMLODIPINE BESYLATE 5 MG PO TABS
5.0000 mg | ORAL_TABLET | Freq: Once | ORAL | Status: AC
  Administered 2023-01-04: 5 mg via ORAL
  Filled 2023-01-04: qty 1

## 2023-01-04 NOTE — ED Triage Notes (Signed)
Pt was at work and got hot and dizzy then had blood pressure taken and it was 200/100. Denies any dizziness at this time. Pt also states having a abnormally light period. Unsure if pregnant.

## 2023-01-04 NOTE — Discharge Instructions (Addendum)
You are seen in the ER today for feeling dizzy and having high blood pressure.  Your blood pressure is back to normal.  This may have been due to prolonged standing, well as being out of your amlodipine.  Your blood work and EKG were reassuring.  Follow-up with your primary care doctor and come back to the ER if you have any new or worsening symptoms

## 2023-01-04 NOTE — ED Provider Notes (Signed)
[place] EMERGENC[facility] DEPARTMENT AT [facility] Provider Note   CSN: [id number] Arrival date & time: 01/04/23  1456     History  Chief Complaint  Patient presents with   Dizziness   Hypertension    Renee [name] is a 42 y.o. female.  He has PMH of hypertension on amlodipine 5 mg and losartan this states has been out of amlodipine for "a while" and cannot get into her PCP for about a month.  Also has a history of hypothyroidism.  Smokes 3 cigarettes a day.  Feels ER today for any syncopal event at work.  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  They were going to call EMS for transport but patient states she wanted to drive herself.  Her symptoms have since resolved.  She denies any chest pain or shortness of breath.  No nausea or vomiting.  No syncope.  No palpitations.   Dizziness Hypertension       Home Medications Prior to Admission medications   Medication Sig Start Date End Date Taking? Authorizing Provider  amLODipine (NORVASC) 5 MG tablet Take 1 tablet (5 mg total) by mouth daily. 01/04/23 04/04/23  Carmel Sacramento A, PA-C  ibuprofen (ADVIL) 800 MG tablet Take 1 tablet (800 mg total) by mouth 3 (three) times daily. 09/14/22   White, Elita Boone, NP  losartan (COZAAR) 100 MG tablet Take 1 tablet (100 mg total) by mouth daily. 12/20/22   Billie Lade, MD  naproxen (NAPROSYN) 375 MG tablet Take 1 tablet (375 mg total) by mouth 2 (two) times daily with a meal. 12/20/22   Darreld Mclean, MD  ondansetron (ZOFRAN-ODT) 4 MG disintegrating tablet Take 1 tablet (4 mg total) by mouth every 8 (eight) hours as needed for nausea or vomiting. 09/14/22   Valinda Hoar, NP      Allergies    Patient has no known allergies.    Review of Systems   Review of  Systems  Neurological:  Positive for dizziness.    Physical Exam Updated Vital Signs BP (!) 158/96 (BP Location: Right Arm)   Pulse 71   Temp 97.9 F (36.6 C) (Oral)   Resp 16   Ht 5\' 2"  (1.575 m)   Wt 104.3 kg   LMP 01/04/2023 Comment: extremely light  SpO2 99%   BMI 42.07 kg/m  Physical Exam Vitals and nursing note reviewed.  Constitutional:      General: She is not in acute distress.    Appearance: She is well-developed.  HENT:     Head: Normocephalic and atraumatic.     Nose: Nose normal.     Mouth/Throat:     Mouth: Mucous membranes are moist.  Eyes:     Conjunctiva/sclera: Conjunctivae normal.  Cardiovascular:     Rate and Rhythm: Normal rate and regular rhythm.     Heart sounds: No murmur heard. Pulmonary:     Effort: Pulmonary effort is normal. No respiratory distress.     Breath sounds: Normal breath sounds.  Abdominal:     Palpations: Abdomen is soft.     Tenderness: There is no abdominal tenderness.  Musculoskeletal:        General: No swelling. Normal range of motion.     Cervical back: Normal range of  motion and neck supple.  Skin:    General: Skin is warm and dry.     Capillary Refill: Capillary refill takes less than 2 seconds.  Neurological:     General: No focal deficit present.     Mental Status: She is alert and oriented to person, place, and time.  Psychiatric:        Mood and Affect: Mood normal.     ED Results / Procedures / Treatments   Labs (all labs ordered are listed, but only abnormal results are displayed) Labs Reviewed  BASIC METABOLIC PANEL - Abnormal; Notable for the following components:      Result Value   Glucose, Bld 120 (*)    Calcium 11.5 (*)    All other components within normal limits  URINALYSIS, ROUTINE W REFLEX MICROSCOPIC - Abnormal; Notable for the following components:   Color, Urine STRAW (*)    APPearance HAZY (*)    Hgb urine dipstick LARGE (*)    Bacteria, UA RARE (*)    All other components within  normal limits  PREGNANCY, URINE  CBC  TROPONIN I (HIGH SENSITIVITY)  TROPONIN I (HIGH SENSITIVITY)    EKG EKG Interpretation Date/Time:  Wednesday January 04 2023 20:03:03 EDT Ventricular Rate:  65 PR Interval:  262 QRS Duration:  88 QT Interval:  374 QTC Calculation: 389 R Axis:   22  Text Interpretation: Sinus rhythm Prolonged PR interval Non-specific ST-t changes Confirmed by Cathren Laine ([id number]) on 01/04/2023 8:15:52 PM  Radiology No results found.  Procedures Procedures    Medications Ordered in ED Medications  amLODipine (NORVASC) tablet 5 mg (5 mg Oral Given 01/04/23 1717)    ED Course/ Medical Decision Making/ A&P                                 Medical Decision Making This patient presents to the ED for concern of high blood pressure and dizziness, this involves an extensive number of treatment options, and is a complaint that carries with it a high risk of complications and morbidity.  The differential diagnosis includes ACS, arrhythmia, syncope, near syncope, vasovagal episode, other   Co morbidities that complicate the patient evaluation  Hypertension   Additional history obtained:  Additional history obtained from EMR External records from outside source obtained and reviewed including notes   Lab Tests:  I Ordered, and personally interpreted labs.  The pertinent results include: Troponins are both undetectable delta of 0, BMP is normal, CBC normal, UA does have rare bacteria but also has 6-10 squamous epithelial cells with no RBCs, no WBCs and no leukocytes     Problem List / ED Course / Critical interventions / Medication management  Patient comes in today because she had high blood pressure at work after experiencing episode of lightheadedness.  She was standing in 1 spot all morning assembling things when she started feeling lightheaded.  Went to the nurse and noted blood pressure to be high so they wanted her to be evaluated.  She states  she did have some sweating when she felt dizzy.  Never had any chest pain or shortness of breath syncope or palpitations.  She does have history of hypertension and smoking and obesity so that troponins to rule out possible atypical ACS.  These were negative.  EKG reassuring.  No tachycardia or hypoxia or leg swelling or pain to suggest VTE as a possible cause.  She is  low risk Wells criteria and PERC negative.  He has been asymptomatic since the initial brief episode.  Likely due to position and prolonged standing.  Blood pressure has  improved in the ED.  Patient notes she has been out of her amlodipine but she was given the ER with some improvement of her blood pressure and represcribed.  She has follow-ups appointment scheduled with her PCP.  Advised on follow-up and return precautions. I  Amount and/or Complexity of Data Reviewed Labs: ordered.  Risk Prescription drug management.           Final Clinical Impression(s) / ED Diagnoses Final diagnoses:  Hypertension, unspecified type  Lightheadedness    Rx / DC Orders ED Discharge Orders          Ordered    amLODipine (NORVASC) 5 MG tablet  Daily        01/04/23 1947              Josem Kaufmann 01/04/23 2208    Cathren Laine, MD 01/05/23 1544

## 2023-02-02 ENCOUNTER — Ambulatory Visit: Payer: 59 | Admitting: Internal Medicine

## 2023-02-07 ENCOUNTER — Telehealth: Payer: Self-pay | Admitting: *Deleted

## 2023-02-07 NOTE — Telephone Encounter (Signed)
Transition Care Management Unsuccessful Follow-up Telephone Call  Date of discharge and from where:  [facility]  01/04/2023  Attempts:  1st Attempt  Reason for unsuccessful TCM follow-up call Patient declined

## 2023-02-14 ENCOUNTER — Other Ambulatory Visit: Payer: Self-pay | Admitting: Internal Medicine

## 2023-02-16 ENCOUNTER — Other Ambulatory Visit: Payer: Self-pay | Admitting: Internal Medicine

## 2023-03-10 ENCOUNTER — Ambulatory Visit
Admission: EM | Admit: 2023-03-10 | Discharge: 2023-03-10 | Disposition: A | Discharge status: Home / Self Care | Payer: 59 | Attending: Family Medicine | Admitting: Family Medicine

## 2023-03-10 DIAGNOSIS — J208 Acute bronchitis due to other specified organisms: Secondary | ICD-10-CM

## 2023-03-10 MED ORDER — PROMETHAZINE-DM 6.25-15 MG/5ML PO SYRP: 5.0000 mL | ORAL_SOLUTION | Freq: Four times a day (QID) | ORAL | 0 refills | Status: DC | PRN

## 2023-03-10 MED ORDER — PREDNISONE 20 MG PO TABS: 40.0000 mg | ORAL_TABLET | Freq: Every day | ORAL | 0 refills | Status: DC

## 2023-03-10 NOTE — ED Provider Notes (Signed)
[facility]    CSN: [id number] Arrival date & time: 03/10/23  1655      History   Chief Complaint No chief complaint on file.   HPI Renee [name] is a 42 y.o. female.   Patient presenting today with 3-day history of cough, chest tightness, sore throat, congestion.  Denies fever, chills, chest pain, abdominal pain, nausea vomiting or diarrhea.  So far trying DayQuil NyQuil with minimal relief.  No known history of chronic pulmonary disease.    Past Medical History:  Diagnosis Date   Elevated serum creatinine 02/19/2021   Herpes simplex virus (HSV) infection    no OB +HSV 05/04/16 at [facility]    Hypertension    Hypothyroidism    Pancreatitis    Vaginal Pap smear, abnormal     Patient Active Problem List   Diagnosis Date Noted   Loud snoring 04/28/2022   Anxiety and depression 04/14/2022   Constipation 02/08/2022   History of vitamin D deficiency 01/26/2022   Risk factors for obstructive sleep apnea 01/26/2022   Positive screening for depression on 9-item Patient Health Questionnaire (PHQ-9) 01/26/2022   Encounter to establish care 01/26/2022   Elevated LFTs    Essential hypertension 01/12/2022   UTI (urinary tract infection) 01/12/2022   Pancreatic duct dilated    Acute on chronic pancreatitis (HCC) 01/11/2022   Hypercalcemia 02/19/2021   Elevated serum creatinine 02/19/2021   Screening mammogram for breast cancer 02/16/2021   Encounter for screening fecal occult blood testing 02/16/2021   Encounter for gynecological examination with Papanicolaou smear of cervix 02/16/2021   Cholelithiasis with chronic cholecystitis 04/13/2020   Cholelithiasis with cholecystitis 04/02/2020   Hyperparathyroidism, primary (HCC) 04/02/2020   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/2015    Past Surgical History:  Procedure Laterality Date   BIOPSY  03/31/2022   Procedure: BIOPSY;  Surgeon: Lemar Lofty., MD;  Location: [facility];  Service: Gastroenterology;;    CHOLECYSTECTOMY N/A 04/13/2020   Procedure: LAPAROSCOPIC CHOLECYSTECTOMY WITH INTRAOPERATIVE CHOLANGIOGRAM;  Surgeon: Darnell Level, MD;  Location: [facility];  Service: General;  Laterality: N/A;   ESOPHAGOGASTRODUODENOSCOPY (EGD) WITH PROPOFOL N/A 03/31/2022   Procedure: ESOPHAGOGASTRODUODENOSCOPY (EGD) WITH PROPOFOL;  Surgeon: Lemar Lofty., MD;  Location: [facility];  Service: Gastroenterology;  Laterality: N/A;   EUS N/A 03/31/2022   Procedure: UPPER ENDOSCOPIC ULTRASOUND (EUS) LINEAR;  Surgeon: Lemar Lofty., MD;  Location: [facility];  Service: Gastroenterology;  Laterality: N/A;   KNEE SURGERY     TONSILLECTOMY      OB History     Gravida  2   Para      Term      Preterm      AB  2   Living         SAB      IAB      Ectopic      Multiple      Live Births               Home Medications    Prior to Admission medications   Medication Sig Start Date End Date Taking? Authorizing Provider  predniSONE (DELTASONE) 20 MG tablet Take 2 tablets (40 mg total) by mouth daily with breakfast. 03/10/23  Yes Particia Nearing, PA-C  promethazine-dextromethorphan (PROMETHAZINE-DM) 6.25-15 MG/5ML syrup Take 5 mLs by mouth 4 (four) times daily as needed. 03/10/23  Yes Particia Nearing, PA-C  amLODipine (NORVASC) 5 MG tablet TAKE 1 TABLET (5 MG TOTAL) BY MOUTH DAILY. 02/16/23 05/17/23  Billie Lade, MD  ibuprofen (ADVIL) 800 MG tablet Take 1 tablet (800 mg total) by mouth 3 (three) times daily. 09/14/22   Valinda Hoar, NP  losartan (COZAAR) 100 MG tablet TAKE 1 TABLET BY MOUTH EVERY DAY 02/14/23   Billie Lade, MD  naproxen (NAPROSYN) 375 MG tablet Take 1 tablet (375 mg total) by mouth 2 (two) times daily with a meal. 12/20/22   Darreld Mclean, MD  ondansetron (ZOFRAN-ODT) 4 MG disintegrating tablet Take 1 tablet (4 mg total) by mouth every 8 (eight) hours as needed for nausea or vomiting. 09/14/22   Valinda Hoar, NP    Family  History Family History  Problem Relation Age of Onset   Congestive Heart Failure Maternal Grandmother    Hypertension Maternal Grandmother    Diabetes Maternal Grandmother    Other Maternal Grandfather        aneursym   Kidney disease Maternal Grandfather        on dialysis   Congestive Heart Failure Maternal Grandfather    Diabetes Maternal Grandfather     Social History Social History   Tobacco Use   Smoking status: Some Days    Current packs/day: 0.00    Average packs/day: 0.3 packs/day for 17.0 years (4.3 ttl pk-yrs)    Types: Cigarettes    Start date: 01/09/2005    Last attempt to quit: 01/09/2022    Years since quitting: 1.1   Smokeless tobacco: Never   Tobacco comments:    smokes 2-3 cig daily  Vaping Use   Vaping status: Never Used  Substance Use Topics   Alcohol use: Not Currently    Comment: socially   Drug use: Not Currently    Types: Marijuana    Comment: occasionally     Allergies   Patient has no known allergies.   Review of Systems Review of Systems Per HPI  Physical Exam Triage Vital Signs ED Triage Vitals  Encounter Vitals Group     BP 03/10/23 1730 (!) 140/84     Systolic BP Percentile --      Diastolic BP Percentile --      Pulse Rate 03/10/23 1730 77     Resp 03/10/23 1730 18     Temp 03/10/23 1730 99 F (37.2 C)     Temp Source 03/10/23 1730 Oral     SpO2 03/10/23 1730 98 %     Weight --      Height --      Head Circumference --      Peak Flow --      Pain Score 03/10/23 1731 0     Pain Loc --      Pain Education --      Exclude from Growth Chart --    No data found.  Updated Vital Signs BP (!) 140/84 (BP Location: Right Arm)   Pulse 77   Temp 99 F (37.2 C) (Oral)   Resp 18   LMP 02/15/2023 (Approximate)   SpO2 98%   Visual Acuity Right Eye Distance:   Left Eye Distance:   Bilateral Distance:    Right Eye Near:   Left Eye Near:    Bilateral Near:     Physical Exam Vitals and nursing note reviewed.   Constitutional:      Appearance: Normal appearance.  HENT:     Head: Atraumatic.     Right Ear: Tympanic membrane and external ear normal.     Left Ear: Tympanic membrane and external  ear normal.     Nose: Rhinorrhea present.     Mouth/Throat:     Mouth: Mucous membranes are moist.     Pharynx: Posterior oropharyngeal erythema present.  Eyes:     Extraocular Movements: Extraocular movements intact.     Conjunctiva/sclera: Conjunctivae normal.  Cardiovascular:     Rate and Rhythm: Normal rate and regular rhythm.     Heart sounds: Normal heart sounds.  Pulmonary:     Effort: Pulmonary effort is normal.     Breath sounds: Normal breath sounds. No wheezing or rales.  Musculoskeletal:        General: Normal range of motion.     Cervical back: Normal range of motion and neck supple.  Skin:    General: Skin is warm and dry.  Neurological:     Mental Status: She is alert and oriented to person, place, and time.  Psychiatric:        Mood and Affect: Mood normal.        Thought Content: Thought content normal.      UC Treatments / Results  Labs (all labs ordered are listed, but only abnormal results are displayed) Labs Reviewed - No data to display  EKG   Radiology No results found.  Procedures Procedures (including critical care time)  Medications Ordered in UC Medications - No data to display  Initial Impression / Assessment and Plan / UC Course  I have reviewed the triage vital signs and the nursing notes.  Pertinent labs & imaging results that were available during my care of the patient were reviewed by me and considered in my medical decision making (see chart for details).     Vitals and exam reassuring today, suspicious for viral bronchitis.  Treat with prednisone, Phenergan DM, supportive over-the-counter medications and home care.  Return for worsening symptoms.  Final Clinical Impressions(s) / UC Diagnoses   Final diagnoses:  Viral bronchitis    Discharge Instructions   None    ED Prescriptions     Medication Sig Dispense Auth. Provider   predniSONE (DELTASONE) 20 MG tablet Take 2 tablets (40 mg total) by mouth daily with breakfast. 10 tablet Particia Nearing, PA-C   promethazine-dextromethorphan (PROMETHAZINE-DM) 6.25-15 MG/5ML syrup Take 5 mLs by mouth 4 (four) times daily as needed. 100 mL Particia Nearing, [place]      PDMP not reviewed this encounter.   Particia Nearing, [place] 03/10/23 1905

## 2023-03-10 NOTE — ED Triage Notes (Signed)
Pt reports she has a cough and sore throat x 3 days   Took dayquil and nyquil

## 2023-03-17 ENCOUNTER — Ambulatory Visit (INDEPENDENT_AMBULATORY_CARE_PROVIDER_SITE_OTHER): Payer: 59 | Admitting: Internal Medicine

## 2023-03-17 VITALS — BP 157/103 | HR 99 | Ht 61.0 in | Wt 243.2 lb

## 2023-03-17 DIAGNOSIS — I10 Essential (primary) hypertension: Secondary | ICD-10-CM

## 2023-03-17 DIAGNOSIS — E210 Primary hyperparathyroidism: Secondary | ICD-10-CM | POA: Diagnosis not present

## 2023-03-17 MED ORDER — LOSARTAN POTASSIUM 100 MG PO TABS: 100.0000 mg | ORAL_TABLET | Freq: Every day | ORAL | 3 refills | Status: DC

## 2023-03-17 MED ORDER — CARVEDILOL 12.5 MG PO TABS: 12.5000 mg | ORAL_TABLET | Freq: Two times a day (BID) | ORAL | 3 refills | Status: DC

## 2023-03-17 MED ORDER — AMLODIPINE BESYLATE 10 MG PO TABS: 10.0000 mg | ORAL_TABLET | Freq: Every day | ORAL | 3 refills | Status: DC

## 2023-03-17 NOTE — Assessment & Plan Note (Signed)
Previously followed by endocrinology at [facility].  Today she reports that her insurance has changed and her endocrinologist's office is no longer covered.  New endocrinology referral requested today.

## 2023-03-17 NOTE — Progress Notes (Signed)
Acute Office Visit  Subjective:     Patient ID: Renee [name], female    DOB: [birth year], 42 y.o.   MRN: [id number]  Chief Complaint  Patient presents with   Hypertension    Follow up    Knee Pain    Knee pain , giving out on patient    Renee [name] presents today for an acute visit to discuss poorly controlled HTN.  She states that she has recently failed 3 pre-employment physicals because her blood pressure has been significantly elevated.  Last seen by me for follow-up on 2/8.  Her blood pressure was adequately controlled with losartan 100 mg daily and amlodipine 5 mg daily at that time.  53-month follow-up recommended.  She has multiple ED presentations in the interim, 11/29 most recently endorsing URI symptoms.  She reports taking amlodipine and losartan as currently prescribed.  BP readings today are 150s/100's.  Review of Systems  Constitutional:  Negative for chills and fever.  HENT:  Negative for sore throat.   Respiratory:  Negative for cough and shortness of breath.   Cardiovascular:  Negative for chest pain, palpitations and leg swelling.  Gastrointestinal:  Negative for abdominal pain, blood in stool, constipation, diarrhea, nausea and vomiting.  Genitourinary:  Negative for dysuria and hematuria.  Musculoskeletal:  Negative for myalgias.  Skin:  Negative for itching and rash.  Neurological:  Negative for dizziness and headaches.  Psychiatric/Behavioral:  Negative for depression and suicidal ideas.       Objective:    BP (!) 157/103 (BP Location: Right Arm, Patient Position: Sitting, Cuff Size: Large)   Pulse 99   Ht 5\' 1"  (1.549 m)   Wt 243 lb 3.2 oz (110.3 kg)   LMP 02/15/2023 (Approximate)   SpO2 97%   BMI 45.95 kg/m  BP Readings from Last 3 Encounters:  03/17/23 (!) 157/103  03/10/23 (!) 140/84  01/04/23 (!) 158/96   Physical Exam Vitals reviewed.  Constitutional:      General: She is not in acute distress.    Appearance: Normal appearance. She is  obese. She is not toxic-appearing.  HENT:     Head: Normocephalic and atraumatic.     Right Ear: External ear normal.     Left Ear: External ear normal.     Nose: Nose normal. No congestion or rhinorrhea.     Mouth/Throat:     Mouth: Mucous membranes are moist.     Pharynx: Oropharynx is clear. No oropharyngeal exudate or posterior oropharyngeal erythema.  Eyes:     General: No scleral icterus.    Extraocular Movements: Extraocular movements intact.     Conjunctiva/sclera: Conjunctivae normal.     Pupils: Pupils are equal, round, and reactive to light.  Cardiovascular:     Rate and Rhythm: Normal rate and regular rhythm.     Pulses: Normal pulses.     Heart sounds: Normal heart sounds. No murmur heard.    No friction rub. No gallop.  Pulmonary:     Effort: Pulmonary effort is normal.     Breath sounds: Normal breath sounds. No wheezing, rhonchi or rales.  Abdominal:     General: Abdomen is flat. Bowel sounds are normal. There is no distension.     Palpations: Abdomen is soft.     Tenderness: There is no abdominal tenderness.  Musculoskeletal:        General: No swelling. Normal range of motion.     Cervical back: Normal range of motion.  Right lower leg: No edema.     Left lower leg: No edema.  Lymphadenopathy:     Cervical: No cervical adenopathy.  Skin:    General: Skin is warm and dry.     Capillary Refill: Capillary refill takes less than 2 seconds.     Coloration: Skin is not jaundiced.  Neurological:     General: No focal deficit present.     Mental Status: She is alert and oriented to person, place, and time.  Psychiatric:        Mood and Affect: Mood normal.        Behavior: Behavior normal.       Assessment & Plan:   Problem List Items Addressed This Visit       Essential hypertension - Primary    Presenting today for an acute visit in the setting of poorly controlled HTN.  BP readings today are 150s/100s.  She is currently prescribed amlodipine 5 mg  daily and losartan 100 mg daily.  She endorses compliance with this regimen. Renee [name] reports that she has recently failed 3 pre-employment physicals as a result of poorly controlled HTN. -Increase amlodipine to 10 mg daily -Add carvedilol 12.5 mg twice daily -Will avoid thiazide diuretics in the setting of hypercalcemia with underlying hyperparathyroidism. -Follow-up in 6 weeks for BP check and routine care.      Hyperparathyroidism, primary [facility])    Previously followed by endocrinology at [facility].  Today she reports that her insurance has changed and her endocrinologist's office is no longer covered.  New endocrinology referral requested today.       Meds ordered this encounter  Medications   losartan (COZAAR) 100 MG tablet    Sig: Take 1 tablet (100 mg total) by mouth daily.    Dispense:  90 tablet    Refill:  3   amLODipine (NORVASC) 10 MG tablet    Sig: Take 1 tablet (10 mg total) by mouth daily.    Dispense:  90 tablet    Refill:  3   carvedilol (COREG) 12.5 MG tablet    Sig: Take 1 tablet (12.5 mg total) by mouth 2 (two) times daily with a meal.    Dispense:  60 tablet    Refill:  3    Return in about 6 weeks (around 04/28/2023) for HTN, routine follow up.  Billie Lade, MD

## 2023-03-17 NOTE — Patient Instructions (Signed)
It was a pleasure to see you today.  Thank you for giving [place] the opportunity to be involved in your care.  Below is a brief recap of your visit and next steps.  We will plan to see you again in 6 weeks.  Summary Increase amlodipine to 10 mg daily Start carvedilol 12.5 mg twice daily New endocrinology referral placed Follow up in 6 weeks

## 2023-03-17 NOTE — Assessment & Plan Note (Signed)
Presenting today for an acute visit in the setting of poorly controlled HTN.  BP readings today are 150s/100s.  She is currently prescribed amlodipine 5 mg daily and losartan 100 mg daily.  She endorses compliance with this regimen. Renee [name] reports that she has recently failed 3 pre-employment physicals as a result of poorly controlled HTN. -Increase amlodipine to 10 mg daily -Add carvedilol 12.5 mg twice daily -Will avoid thiazide diuretics in the setting of hypercalcemia with underlying hyperparathyroidism. -Follow-up in 6 weeks for BP check and routine care.

## 2023-03-18 ENCOUNTER — Other Ambulatory Visit: Payer: Self-pay | Admitting: Internal Medicine

## 2023-03-18 DIAGNOSIS — I10 Essential (primary) hypertension: Secondary | ICD-10-CM

## 2023-03-31 ENCOUNTER — Ambulatory Visit
Admission: RE | Admit: 2023-03-31 | Discharge: 2023-03-31 | Disposition: A | Discharge status: Home / Self Care | Payer: 59 | Source: Ambulatory Visit | Attending: Nurse Practitioner | Admitting: Nurse Practitioner

## 2023-03-31 ENCOUNTER — Ambulatory Visit (INDEPENDENT_AMBULATORY_CARE_PROVIDER_SITE_OTHER): Payer: 59

## 2023-03-31 VITALS — BP 136/87 | HR 81 | Temp 98.5°F | Resp 18

## 2023-03-31 DIAGNOSIS — G8929 Other chronic pain: Secondary | ICD-10-CM | POA: Diagnosis not present

## 2023-03-31 DIAGNOSIS — M25562 Pain in left knee: Secondary | ICD-10-CM

## 2023-03-31 MED ORDER — TRAMADOL HCL 50 MG PO TABS: 50.0000 mg | ORAL_TABLET | Freq: Two times a day (BID) | ORAL | 0 refills | Status: DC | PRN

## 2023-03-31 NOTE — ED Provider Notes (Signed)
[facility]    CSN: [id number] Arrival date & time: 03/31/23  1220      History   Chief Complaint Chief Complaint  Patient presents with   Knee Injury    Entered by patient    HPI Renee [name] is a 42 y.o. female.   The history is provided by the patient.   Patient presents for complaints of left knee pain.  Patient states that she let her dogs out and 1 "clipped" her left knee causing her to fall onto her right side.  Patient states since that time, she has had pain to the inside of the left knee.  She also endorses some mild swelling.  Patient with prior history of ACL injury.  Patient states that she has been weightbearing, but has limited that is much as possible.  Reports that she did ice the knee and took naproxen at home.  She denies numbness, tingling, or radiation of pain.  Past Medical History:  Diagnosis Date   Elevated serum creatinine 02/19/2021   Herpes simplex virus (HSV) infection    no OB +HSV 05/04/16 at [facility]    Hypertension    Hypothyroidism    Pancreatitis    Vaginal Pap smear, abnormal     Patient Active Problem List   Diagnosis Date Noted   Loud snoring 04/28/2022   Anxiety and depression 04/14/2022   Constipation 02/08/2022   History of vitamin D deficiency 01/26/2022   Risk factors for obstructive sleep apnea 01/26/2022   Positive screening for depression on 9-item Patient Health Questionnaire (PHQ-9) 01/26/2022   Encounter to establish care 01/26/2022   Elevated LFTs    Essential hypertension 01/12/2022   UTI (urinary tract infection) 01/12/2022   Pancreatic duct dilated    Acute on chronic pancreatitis (HCC) 01/11/2022   Hypercalcemia 02/19/2021   Elevated serum creatinine 02/19/2021   Screening mammogram for breast cancer 02/16/2021   Encounter for screening fecal occult blood testing 02/16/2021   Encounter for gynecological examination with Papanicolaou smear of cervix 02/16/2021   Cholelithiasis with chronic  cholecystitis 04/13/2020   Cholelithiasis with cholecystitis 04/02/2020   Hyperparathyroidism, primary (HCC) 04/02/2020   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/2015    Past Surgical History:  Procedure Laterality Date   BIOPSY  03/31/2022   Procedure: BIOPSY;  Surgeon: Lemar Lofty., MD;  Location: [facility];  Service: Gastroenterology;;   CHOLECYSTECTOMY N/A 04/13/2020   Procedure: LAPAROSCOPIC CHOLECYSTECTOMY WITH INTRAOPERATIVE CHOLANGIOGRAM;  Surgeon: Darnell Level, MD;  Location: [facility];  Service: General;  Laterality: N/A;   ESOPHAGOGASTRODUODENOSCOPY (EGD) WITH PROPOFOL N/A 03/31/2022   Procedure: ESOPHAGOGASTRODUODENOSCOPY (EGD) WITH PROPOFOL;  Surgeon: Lemar Lofty., MD;  Location: [facility];  Service: Gastroenterology;  Laterality: N/A;   EUS N/A 03/31/2022   Procedure: UPPER ENDOSCOPIC ULTRASOUND (EUS) LINEAR;  Surgeon: Lemar Lofty., MD;  Location: [facility];  Service: Gastroenterology;  Laterality: N/A;   KNEE SURGERY     TONSILLECTOMY      OB History     Gravida  2   Para      Term      Preterm      AB  2   Living         SAB      IAB      Ectopic      Multiple      Live Births               Home Medications    Prior to  Admission medications   Medication Sig Start Date End Date Taking? Authorizing Provider  traMADol (ULTRAM) 50 MG tablet Take 1 tablet (50 mg total) by mouth every 12 (twelve) hours as needed. 03/31/23  Yes Leath-Warren, Sadie Haber, NP  amLODipine (NORVASC) 10 MG tablet Take 1 tablet (10 mg total) by mouth daily. 03/17/23   Billie Lade, MD  carvedilol (COREG) 12.5 MG tablet TAKE 1 TABLET (12.5MG  TOTAL) BY MOUTH TWICE A DAY WITH MEALS 03/20/23   Billie Lade, MD  ibuprofen (ADVIL) 800 MG tablet Take 1 tablet (800 mg total) by mouth 3 (three) times daily. 09/14/22   White, Elita Boone, NP  losartan (COZAAR) 100 MG tablet Take 1 tablet (100 mg total) by mouth daily. 03/17/23   Billie Lade, MD   naproxen (NAPROSYN) 375 MG tablet Take 1 tablet (375 mg total) by mouth 2 (two) times daily with a meal. 12/20/22   Darreld Mclean, MD  predniSONE (DELTASONE) 20 MG tablet Take 2 tablets (40 mg total) by mouth daily with breakfast. 03/10/23   Particia Nearing, PA-C  promethazine-dextromethorphan (PROMETHAZINE-DM) 6.25-15 MG/5ML syrup Take 5 mLs by mouth 4 (four) times daily as needed. 03/10/23   Particia Nearing, PA-C    Family History Family History  Problem Relation Age of Onset   Congestive Heart Failure Maternal Grandmother    Hypertension Maternal Grandmother    Diabetes Maternal Grandmother    Other Maternal Grandfather        aneursym   Kidney disease Maternal Grandfather        on dialysis   Congestive Heart Failure Maternal Grandfather    Diabetes Maternal Grandfather     Social History Social History   Tobacco Use   Smoking status: Some Days    Current packs/day: 0.00    Average packs/day: 0.3 packs/day for 17.0 years (4.3 ttl pk-yrs)    Types: Cigarettes    Start date: 01/09/2005    Last attempt to quit: 01/09/2022    Years since quitting: 1.2   Smokeless tobacco: Never   Tobacco comments:    smokes 2-3 cig daily  Vaping Use   Vaping status: Never Used  Substance Use Topics   Alcohol use: Not Currently    Comment: socially   Drug use: Not Currently    Types: Marijuana    Comment: occasionally     Allergies   Patient has no known allergies.   Review of Systems Review of Systems Per HPI  Physical Exam Triage Vital Signs ED Triage Vitals [03/31/23 1231]  Encounter Vitals Group     BP 136/87     Systolic BP Percentile      Diastolic BP Percentile      Pulse Rate 81     Resp 18     Temp 98.5 F (36.9 C)     Temp src      SpO2 98 %     Weight      Height      Head Circumference      Peak Flow      Pain Score 8     Pain Loc      Pain Education      Exclude from Growth Chart    No data found.  Updated Vital Signs BP 136/87 (BP  Location: Right Arm)   Pulse 81   Temp 98.5 F (36.9 C)   Resp 18   LMP 03/17/2023 (Approximate)   SpO2 98%   Visual Acuity Right Eye  Distance:   Left Eye Distance:   Bilateral Distance:    Right Eye Near:   Left Eye Near:    Bilateral Near:     Physical Exam Vitals and nursing note reviewed.  Constitutional:      General: She is not in acute distress.    Appearance: Normal appearance.  HENT:     Head: Normocephalic.  Eyes:     Extraocular Movements: Extraocular movements intact.     Pupils: Pupils are equal, round, and reactive to light.  Pulmonary:     Effort: Pulmonary effort is normal.  Musculoskeletal:     Cervical back: Normal range of motion.     Left knee: Swelling present. No deformity, erythema or ecchymosis. Decreased range of motion. Tenderness present over the medial joint line and MCL. Normal pulse.  Skin:    General: Skin is warm and dry.  Neurological:     General: No focal deficit present.     Mental Status: She is alert and oriented to person, place, and time.  Psychiatric:        Mood and Affect: Mood normal.        Behavior: Behavior normal.      UC Treatments / Results  Labs (all labs ordered are listed, but only abnormal results are displayed) Labs Reviewed - No data to display  EKG   Radiology No results found.  Procedures Procedures (including critical care time)  Medications Ordered in UC Medications - No data to display  Initial Impression / Assessment and Plan / UC Course  I have reviewed the triage vital signs and the nursing notes.  Pertinent labs & imaging results that were available during my care of the patient were reviewed by me and considered in my medical decision making (see chart for details).  X-ray of the left knee is pending.  Reviewed preliminary x-ray, did not identify any obvious dislocation or fracture.  Patient with substantial degeneration of the left knee based on the x-ray result.  Hinged knee brace was  provided to allow for additional compression and support.  Tramadol 50 mg for severe left knee pain.  Supportive care recommendations were provided and discussed with the patient to include RICE therapy, and over-the-counter analgesics for less pain.  Patient was advised to follow-up with orthopedics for reevaluation.  Patient states that she is planning to see orthopedics in January.  Patient advised to follow-up sooner if symptoms worsen.  Patient was in agreement with this plan of care and verbalizes understanding.  All questions were answered.  Patient stable for discharge.  Final Clinical Impressions(s) / UC Diagnoses   Final diagnoses:  Chronic pain of left knee     Discharge Instructions      X-ray of the left knee is pending.  You will be contacted when the x-ray results are received.  You also have access to the results via MyChart. Take medication as prescribed.  You may also take over-the-counter Tylenol or ibuprofen for less severe pain. RICE therapy, rest, ice, compression, and elevation. Gentle range of motion exercises to keep the joint mobile. I would like for you to follow-up with orthopedics in January as scheduled. Follow-up as needed.      ED Prescriptions     Medication Sig Dispense Auth. Provider   traMADol (ULTRAM) 50 MG tablet Take 1 tablet (50 mg total) by mouth every 12 (twelve) hours as needed. 10 tablet Leath-Warren, Sadie Haber, NP      I have reviewed the PDMP  during this encounter.   Abran Cantor, NP 03/31/23 1323

## 2023-03-31 NOTE — ED Triage Notes (Signed)
Pt reports her dogs came at her full speed and she fell on her left knee side ways x 1 day. Pain increases with walking

## 2023-03-31 NOTE — Discharge Instructions (Addendum)
X-ray of the left knee is pending.  You will be contacted when the x-ray results are received.  You also have access to the results via MyChart. Take medication as prescribed.  You may also take over-the-counter Tylenol or ibuprofen for less severe pain. RICE therapy, rest, ice, compression, and elevation. You have been provided a knee brace to allow for additional compression and support.  Wear the knee brace when you are engaged in prolonged or strenuous activity.  Crutches have also been provided to help with ambulation.  Try to weight-bear as tolerated. Gentle range of motion exercises to keep the joint mobile. I would like for you to follow-up with orthopedics in January as scheduled. Follow-up as needed.

## 2023-04-01 ENCOUNTER — Telehealth: Payer: Self-pay | Admitting: Nurse Practitioner

## 2023-04-01 NOTE — Telephone Encounter (Signed)
Called patient to discuss x-ray result from 03/31/2023 visit.  Reach patient's voicemail, left message for patient to return call.

## 2023-04-02 ENCOUNTER — Other Ambulatory Visit: Payer: Self-pay

## 2023-04-02 ENCOUNTER — Encounter (HOSPITAL_COMMUNITY): Payer: Self-pay | Admitting: *Deleted

## 2023-04-02 ENCOUNTER — Emergency Department (HOSPITAL_COMMUNITY)
Admission: EM | Admit: 2023-04-02 | Discharge: 2023-04-02 | Disposition: A | Discharge status: Home / Self Care | Payer: 59 | Attending: Emergency Medicine | Admitting: Emergency Medicine

## 2023-04-02 DIAGNOSIS — I10 Essential (primary) hypertension: Secondary | ICD-10-CM | POA: Diagnosis not present

## 2023-04-02 DIAGNOSIS — Z79899 Other long term (current) drug therapy: Secondary | ICD-10-CM | POA: Insufficient documentation

## 2023-04-02 DIAGNOSIS — R079 Chest pain, unspecified: Secondary | ICD-10-CM | POA: Insufficient documentation

## 2023-04-02 DIAGNOSIS — R1013 Epigastric pain: Secondary | ICD-10-CM | POA: Insufficient documentation

## 2023-04-02 LAB — CBC WITH DIFFERENTIAL/PLATELET
Abs Immature Granulocytes: 0.03 10*3/uL (ref 0.00–0.07)
Basophils Absolute: 0 10*3/uL (ref 0.0–0.1)
Basophils Relative: 0 %
Eosinophils Absolute: 0.1 10*3/uL (ref 0.0–0.5)
Eosinophils Relative: 1 %
HCT: 42 % (ref 36.0–46.0)
Hemoglobin: 13.4 g/dL (ref 12.0–15.0)
Immature Granulocytes: 0 %
Lymphocytes Relative: 13 %
Lymphs Abs: 1.3 10*3/uL (ref 0.7–4.0)
MCH: 27.9 pg (ref 26.0–34.0)
MCHC: 31.9 g/dL (ref 30.0–36.0)
MCV: 87.5 fL (ref 80.0–100.0)
Monocytes Absolute: 0.8 10*3/uL (ref 0.1–1.0)
Monocytes Relative: 8 %
Neutro Abs: 7.4 10*3/uL (ref 1.7–7.7)
Neutrophils Relative %: 78 %
Platelets: 309 10*3/uL (ref 150–400)
RBC: 4.8 MIL/uL (ref 3.87–5.11)
RDW: 12.7 % (ref 11.5–15.5)
WBC: 9.7 10*3/uL (ref 4.0–10.5)
nRBC: 0 % (ref 0.0–0.2)

## 2023-04-02 LAB — COMPREHENSIVE METABOLIC PANEL
ALT: 48 U/L — ABNORMAL HIGH (ref 0–44)
AST: 106 U/L — ABNORMAL HIGH (ref 15–41)
Albumin: 3.8 g/dL (ref 3.5–5.0)
Alkaline Phosphatase: 77 U/L (ref 38–126)
Anion gap: 8 (ref 5–15)
BUN: 14 mg/dL (ref 6–20)
CO2: 21 mmol/L — ABNORMAL LOW (ref 22–32)
Calcium: 11.6 mg/dL — ABNORMAL HIGH (ref 8.9–10.3)
Chloride: 106 mmol/L (ref 98–111)
Creatinine, Ser: 0.86 mg/dL (ref 0.44–1.00)
GFR, Estimated: >60 mL/min (ref >60)
Glucose, Bld: 111 mg/dL — ABNORMAL HIGH (ref 70–99)
Potassium: 4.1 mmol/L (ref 3.5–5.1)
Sodium: 135 mmol/L (ref 135–145)
Total Bilirubin: 0.4 mg/dL (ref <1.2)
Total Protein: 7.5 g/dL (ref 6.5–8.1)

## 2023-04-02 LAB — TROPONIN I (HIGH SENSITIVITY)
Troponin I (High Sensitivity): 4 ng/L (ref <18)
Troponin I (High Sensitivity): 4 ng/L (ref <18)

## 2023-04-02 LAB — ETHANOL: Alcohol, Ethyl (B): <10 mg/dL (ref <10)

## 2023-04-02 LAB — LIPASE, BLOOD: Lipase: 26 U/L (ref 11–51)

## 2023-04-02 MED ORDER — MORPHINE SULFATE (PF) 4 MG/ML IV SOLN: 4.0000 mg | Freq: Once | INTRAVENOUS | Status: DC

## 2023-04-02 NOTE — Discharge Instructions (Signed)
Your testing is reassuring  Thank you for allowing [place] to treat you in the emergency department today.  After reviewing your examination and potential testing that was done it appears that you are safe to go home.  I would like for you to follow-up with your doctor within the next several days, have them obtain your records and follow-up with them to review all potential tests and results from your visit.  If you should develop severe or worsening symptoms return to the emergency department immediately

## 2023-04-02 NOTE — ED Triage Notes (Addendum)
Pt with mid CP starting about 30 minutes ago while driving. Denies any SOB or diaphoresis. States pain comes in "waves". Pt states feels like the same pain when she had pancreatitis in the past.

## 2023-04-02 NOTE — ED Provider Notes (Signed)
[place] EMERGENCY DEPARTMENT AT [facility] Provider Note   CSN: [id number] Arrival date & time: 04/02/23  1427     History  Chief Complaint  Patient presents with   Chest Pain    Renee [name] is a 42 y.o. female.   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  The patient presents today stating that while she was driving her car 45 minutes ago she developed a severe epigastric discomfort, there is no radiation of this pain, she does not have shortness of breath nausea or vomiting, she states this feels similar to pancreatitis in the past, she has no prior history of cardiac abnormalities.    Home Medications Prior to Admission medications   Medication Sig Start Date End Date Taking? Authorizing Provider  amLODipine (NORVASC) 10 MG tablet Take 1 tablet (10 mg total) by mouth daily. 03/17/23   Billie Lade, MD  carvedilol (COREG) 12.5 MG tablet TAKE 1 TABLET (12.5MG  TOTAL) BY MOUTH TWICE A DAY WITH MEALS 03/20/23   Billie Lade, MD  ibuprofen (ADVIL) 800 MG tablet Take 1 tablet (800 mg total) by mouth 3 (three) times daily. 09/14/22   White, Elita Boone, NP  losartan (COZAAR) 100 MG tablet Take 1 tablet (100 mg total) by mouth daily. 03/17/23   Billie Lade, MD  naproxen (NAPROSYN) 375 MG tablet Take 1 tablet (375 mg total) by mouth 2 (two) times daily with a meal. 12/20/22   Darreld Mclean, MD  predniSONE (DELTASONE) 20 MG tablet Take 2 tablets (40 mg total) by mouth daily with breakfast. 03/10/23   Particia Nearing, PA-C   promethazine-dextromethorphan (PROMETHAZINE-DM) 6.25-15 MG/5ML syrup Take 5 mLs by mouth 4 (four) times daily as needed. 03/10/23   Particia Nearing, PA-C  traMADol (ULTRAM) 50 MG tablet Take 1 tablet (50 mg total) by mouth every 12 (twelve) hours as needed. 03/31/23   Leath-Warren, Sadie Haber, NP      Allergies    Patient has no known allergies.    Review of Systems   Review of Systems  Cardiovascular:  Positive for chest pain.  All other systems reviewed and are negative.   Physical Exam Updated Vital Signs BP 97/75   Pulse 96   Temp 99.3 F (37.4 C) (Oral)   Resp 15   Ht 1.549 m (5\' 1" )   Wt 108.9 kg   LMP 03/17/2023 (Approximate)   SpO2 96%   BMI 45.35 kg/m  Physical Exam Vitals and nursing note reviewed.  Constitutional:      General: She is not in acute distress.    Appearance: She is well-developed.  HENT:     Head: Normocephalic and atraumatic.     Mouth/Throat:     Pharynx: No oropharyngeal exudate.  Eyes:     General: No scleral icterus.       Right eye: No discharge.        Left eye: No discharge.     Conjunctiva/sclera: Conjunctivae normal.     Pupils: Pupils are equal, round, and reactive to light.  Neck:  Thyroid: No thyromegaly.     Vascular: No JVD.  Cardiovascular:     Rate and Rhythm: Normal rate and regular rhythm.     Heart sounds: Normal heart sounds. No murmur heard.    No friction rub. No gallop.  Pulmonary:     Effort: Pulmonary effort is normal. No respiratory distress.     Breath sounds: Normal breath sounds. No wheezing or rales.  Abdominal:     General: Bowel sounds are normal. There is no distension.     Palpations: Abdomen is soft. There is no mass.     Tenderness: There is no abdominal tenderness.  Musculoskeletal:        General: No tenderness. Normal range of motion.     Cervical back: Normal range of motion and neck supple.     Right lower leg: No tenderness.     Left lower leg: No tenderness.  Lymphadenopathy:      Cervical: No cervical adenopathy.  Skin:    General: Skin is warm and dry.     Findings: No erythema or rash.  Neurological:     Mental Status: She is alert.     Coordination: Coordination normal.  Psychiatric:        Behavior: Behavior normal.     ED Results / Procedures / Treatments   Labs (all labs ordered are listed, but only abnormal results are displayed) Labs Reviewed  COMPREHENSIVE METABOLIC PANEL - Abnormal; Notable for the following components:      Result Value   CO2 21 (*)    Glucose, Bld 111 (*)    Calcium 11.6 (*)    AST 106 (*)    ALT 48 (*)    All other components within normal limits  CBC WITH DIFFERENTIAL/PLATELET  LIPASE, BLOOD  ETHANOL  TROPONIN I (HIGH SENSITIVITY)  TROPONIN I (HIGH SENSITIVITY)    EKG EKG Interpretation Date/Time:  Sunday April 02 2023 14:44:30 EST Ventricular Rate:  71 PR Interval:  212 QRS Duration:  82 QT Interval:  362 QTC Calculation: 393 R Axis:   24  Text Interpretation: Sinus rhythm with 1st degree A-V block with Premature atrial complexes Nonspecific T wave abnormality Abnormal ECG When compared with ECG of 04-Jan-2023 20:03, PREVIOUS ECG IS PRESENT since last tracing no significant change Confirmed by Eber Hong ([id number]) on 04/02/2023 3:05:10 PM  Radiology No results found.  Procedures Procedures    Medications Ordered in ED Medications  morphine (PF) 4 MG/ML injection 4 mg (has no administration in time range)    ED Course/ Medical Decision Making/ A&P                                 Medical Decision Making Amount and/or Complexity of Data Reviewed Labs: ordered.  Risk Prescription drug management.    This patient presents to the ED for concern of epigastric pain and/or chest pain, this involves an extensive number of treatment options, and is a complaint that carries with it a high risk of complications and morbidity.  The differential diagnosis includes pancreatitis, cholecystitis,  pneumothorax, less likely to be dissection, acute coronary syndrome would be a consideration but does not have an exertional component nor does it radiate, interestingly I cannot reproduce the pain at all on palpation of the abdomen   Co morbidities that complicate the patient evaluation  Hypertension   Additional history obtained:  Additional history obtained from the record External records from  outside source obtained and reviewed including prior admission with MRCP   Lab Tests:  I Ordered, and personally interpreted labs.  The pertinent results include: Unremarkable lipase, mild transaminitis, the patient was informed of these results     Cardiac Monitoring: / EKG:  The patient was maintained on a cardiac monitor.  I personally viewed and interpreted the cardiac monitored which showed an underlying rhythm of: Normal sinus rhythm   The patient improved significantly, there is no tachycardia at the time of discharge, there is no fever, she states her pain is completely resolved after single dose of medicine, she is not nauseated and feels better.  Negative troponin, negative lab workup otherwise, she has no jaundice, she has no pain with eating, she is nontender on repeat exam    Social Determinants of Health:  None   Test / Admission - Considered:  Consider admission but the patient is completely resolved and feels comfortable for discharge         Final Clinical Impression(s) / ED Diagnoses Final diagnoses:  Epigastric pain    Rx / DC Orders ED Discharge Orders     None         Eber Hong, MD 04/02/23 2000

## 2023-04-19 ENCOUNTER — Encounter: Payer: Self-pay | Admitting: Orthopaedic Surgery

## 2023-04-19 ENCOUNTER — Ambulatory Visit: Payer: 59 | Admitting: Orthopaedic Surgery

## 2023-04-19 VITALS — BP 115/74 | HR 75 | Ht 63.0 in | Wt 240.0 lb

## 2023-04-19 DIAGNOSIS — G8929 Other chronic pain: Secondary | ICD-10-CM

## 2023-04-19 DIAGNOSIS — M25562 Pain in left knee: Secondary | ICD-10-CM

## 2023-04-19 NOTE — Progress Notes (Signed)
 I hurt my left knee.  Her large dog, named Dior, knocked her down two days before Christmas.  She hurt her left knee.  The right knee is doing well after the injection.  She had some swelling and pain but it has gotten better.  She has some giving way but that is better also.  Left knee has no effusion, slight crepitus, ROM is full, stable, no distal edema.  NV intact.  Encounter Diagnosis  Name Primary?   Chronic pain of left knee Yes   I told her to continue to watch the knee.  It is getting better.  I am concerned about the giving way and she may need a MRI.  Return in one month.  Call if any problem.  Precautions discussed.  Electronically Signed Lemond Stable, MD 1/8/202510:32 AM

## 2023-05-17 ENCOUNTER — Encounter: Payer: Self-pay | Admitting: Orthopaedic Surgery

## 2023-05-17 ENCOUNTER — Ambulatory Visit (INDEPENDENT_AMBULATORY_CARE_PROVIDER_SITE_OTHER): Payer: 59 | Admitting: Orthopaedic Surgery

## 2023-05-17 VITALS — BP 148/97 | HR 71 | Ht 63.0 in | Wt 240.0 lb

## 2023-05-17 DIAGNOSIS — M25562 Pain in left knee: Secondary | ICD-10-CM

## 2023-05-17 DIAGNOSIS — G8929 Other chronic pain: Secondary | ICD-10-CM | POA: Diagnosis not present

## 2023-05-17 MED ORDER — METHYLPREDNISOLONE ACETATE 40 MG/ML IJ SUSP
40.0000 mg | Freq: Once | INTRAMUSCULAR | Status: AC
  Administered 2023-05-17: 40 mg via INTRA_ARTICULAR

## 2023-05-17 NOTE — Progress Notes (Signed)
 My knee is worse.  She has more pain with the left knee.  It swells and gives way.  She works for [employer] and is in and out of her car often.  She has no trauma but the knee is hurting way more.  She has no numbness.  Left knee has effusion, crepitus, ROM 0 to 105, limp left, positive medial [place], NV intact, no distal edema.  Encounter Diagnosis  Name Primary?   Chronic pain of left knee Yes   I will get MRI of the left knee.  PROCEDURE NOTE:  The patient requests injections of the left knee , verbal consent was obtained.  The left knee was prepped appropriately after time out was performed.   Sterile technique was observed and injection of 1 cc of DepoMedrol 40 mg with several cc's of plain xylocaine . Anesthesia was provided by ethyl chloride and a 20-gauge needle was used to inject the knee area. The injection was tolerated well.  A band aid dressing was applied.  The patient was advised to apply ice later today and tomorrow to the injection sight as needed.   Return in two weeks.  Call if any problem.  Precautions discussed.  Electronically Signed Lemond Stable, MD 2/5/20259:46 AM

## 2023-05-26 ENCOUNTER — Emergency Department (HOSPITAL_COMMUNITY)
Admission: EM | Admit: 2023-05-26 | Discharge: 2023-05-26 | Disposition: A | Discharge status: Home / Self Care | Payer: 59 | Attending: Emergency Medicine | Admitting: Emergency Medicine

## 2023-05-26 ENCOUNTER — Other Ambulatory Visit: Payer: Self-pay

## 2023-05-26 ENCOUNTER — Emergency Department (HOSPITAL_COMMUNITY): Payer: 59

## 2023-05-26 DIAGNOSIS — R110 Nausea: Secondary | ICD-10-CM | POA: Insufficient documentation

## 2023-05-26 DIAGNOSIS — R072 Precordial pain: Secondary | ICD-10-CM | POA: Diagnosis present

## 2023-05-26 DIAGNOSIS — R079 Chest pain, unspecified: Secondary | ICD-10-CM

## 2023-05-26 LAB — COMPREHENSIVE METABOLIC PANEL
ALT: 15 U/L (ref 0–44)
AST: 16 U/L (ref 15–41)
Albumin: 3.7 g/dL (ref 3.5–5.0)
Alkaline Phosphatase: 74 U/L (ref 38–126)
Anion gap: 7 (ref 5–15)
BUN: 10 mg/dL (ref 6–20)
CO2: 21 mmol/L — ABNORMAL LOW (ref 22–32)
Calcium: 10.7 mg/dL — ABNORMAL HIGH (ref 8.9–10.3)
Chloride: 105 mmol/L (ref 98–111)
Creatinine, Ser: 0.67 mg/dL (ref 0.44–1.00)
GFR, Estimated: >60 mL/min (ref >60)
Glucose, Bld: 108 mg/dL — ABNORMAL HIGH (ref 70–99)
Potassium: 3.6 mmol/L (ref 3.5–5.1)
Sodium: 133 mmol/L — ABNORMAL LOW (ref 135–145)
Total Bilirubin: 0.6 mg/dL (ref 0.0–1.2)
Total Protein: 7.2 g/dL (ref 6.5–8.1)

## 2023-05-26 LAB — CBC
HCT: 39.2 % (ref 36.0–46.0)
Hemoglobin: 13 g/dL (ref 12.0–15.0)
MCH: 28.6 pg (ref 26.0–34.0)
MCHC: 33.2 g/dL (ref 30.0–36.0)
MCV: 86.2 fL (ref 80.0–100.0)
Platelets: 342 10*3/uL (ref 150–400)
RBC: 4.55 MIL/uL (ref 3.87–5.11)
RDW: 13 % (ref 11.5–15.5)
WBC: 7.9 10*3/uL (ref 4.0–10.5)
nRBC: 0 % (ref 0.0–0.2)

## 2023-05-26 LAB — LIPASE, BLOOD: Lipase: 34 U/L (ref 11–51)

## 2023-05-26 LAB — TROPONIN I (HIGH SENSITIVITY)
Troponin I (High Sensitivity): <2 ng/L (ref <18)
Troponin I (High Sensitivity): <2 ng/L (ref <18)

## 2023-05-26 MED ORDER — FENTANYL CITRATE PF 50 MCG/ML IJ SOSY: 50.0000 ug | PREFILLED_SYRINGE | Freq: Once | INTRAMUSCULAR | Status: DC

## 2023-05-26 MED ORDER — KETOROLAC TROMETHAMINE 30 MG/ML IJ SOLN
30.0000 mg | Freq: Once | INTRAMUSCULAR | Status: AC
  Administered 2023-05-26: 30 mg via INTRAVENOUS
  Filled 2023-05-26: qty 1

## 2023-05-26 NOTE — ED Provider Notes (Signed)
[place] EMERGENCY DEPARTMENT AT [facility] Provider Note   CSN: [id number] Arrival date & time: 05/26/23  1218     History  Chief Complaint  Patient presents with   Chest Pain    Renee [name] is a 43 y.o. female.  She is here with a complaint of substernal chest pain that wraps around her right breast into her back that is been going on since yesterday.  Started while she was driving.  Associated with nausea.  7 out of 10 intensity.  Waxes and wanes.  Slight improvement using heating pad.  No trauma no cough or shortness of breath no dizziness or diaphoresis.  No history of cardiac disease.  She has her gallbladder removed in the past.  Infrequent tobacco and no cocaine use.  The history is provided by the patient.  Chest Pain Pain location:  Substernal area Pain quality: aching   Radiates to: right chest and back. Pain severity:  Moderate Onset quality:  Gradual Duration:  24 hours Timing:  Constant Progression:  Waxing and waning Chronicity:  New Relieved by:  Nothing Ineffective treatments: warm compress. Associated symptoms: nausea   Associated symptoms: no abdominal pain, no cough, no diaphoresis, no dizziness, no fever, no shortness of breath and no vomiting   Risk factors: smoking        Home Medications Prior to Admission medications   Medication Sig Start Date End Date Taking? Authorizing Provider  amLODipine (NORVASC) 10 MG tablet Take 1 tablet (10 mg total) by mouth daily. 03/17/23   Billie Lade, MD  carvedilol (COREG) 12.5 MG tablet TAKE 1 TABLET (12.5MG  TOTAL) BY MOUTH TWICE A DAY WITH MEALS 03/20/23   Billie Lade, MD  losartan (COZAAR) 100 MG tablet Take 1 tablet (100 mg total) by mouth daily. 03/17/23   Billie Lade, MD  naproxen (NAPROSYN) 375 MG tablet Take 1 tablet (375 mg total) by mouth 2 (two) times daily with a meal. 12/20/22   Darreld Mclean, MD      Allergies    Patient has no known allergies.    Review of Systems    Review of Systems  Constitutional:  Negative for diaphoresis and fever.  Respiratory:  Negative for cough and shortness of breath.   Cardiovascular:  Positive for chest pain.  Gastrointestinal:  Positive for nausea. Negative for abdominal pain and vomiting.  Neurological:  Negative for dizziness.    Physical Exam Updated Vital Signs BP (!) 151/84   Pulse 80   Temp 98.4 F (36.9 C) (Oral)   Resp 18   Ht 5\' 3"  (1.6 m)   Wt 110.7 kg   LMP 05/11/2023 (Approximate)   SpO2 99%   BMI 43.22 kg/m  Physical Exam Vitals and nursing note reviewed.  Constitutional:      General: She is not in acute distress.    Appearance: She is well-developed.  HENT:     Head: Normocephalic and atraumatic.  Eyes:     Conjunctiva/sclera: Conjunctivae normal.  Cardiovascular:     Rate and Rhythm: Normal rate and regular rhythm.     Heart sounds: Normal heart sounds. No murmur heard. Pulmonary:     Effort: Pulmonary effort is normal. No respiratory distress.     Breath sounds: Normal breath sounds.  Abdominal:     Palpations: Abdomen is soft.     Tenderness: There is no abdominal tenderness.  Musculoskeletal:        General: No swelling. Normal range of motion.  Cervical back: Neck supple.     Right lower leg: No tenderness. No edema.     Left lower leg: No tenderness. No edema.  Skin:    General: Skin is warm and dry.     Capillary Refill: Capillary refill takes less than 2 seconds.  Neurological:     Mental Status: She is alert.  Psychiatric:        Mood and Affect: Mood normal.     ED Results / Procedures / Treatments   Labs (all labs ordered are listed, but only abnormal results are displayed) Labs Reviewed  COMPREHENSIVE METABOLIC PANEL - Abnormal; Notable for the following components:      Result Value   Sodium 133 (*)    CO2 21 (*)    Glucose, Bld 108 (*)    Calcium 10.7 (*)    All other components within normal limits  CBC  LIPASE, BLOOD  POC URINE PREG, ED  TROPONIN  I (HIGH SENSITIVITY)  TROPONIN I (HIGH SENSITIVITY)    EKG EKG Interpretation Date/Time:  Friday May 26 2023 12:27:58 EST Ventricular Rate:  81 PR Interval:  210 QRS Duration:  85 QT Interval:  343 QTC Calculation: 399 R Axis:   29  Text Interpretation: Sinus rhythm Prolonged PR interval Abnormal R-wave progression, early transition No significant change since prior 12/24 Confirmed by Meridee Score [phone]) on 05/26/2023 12:36:32 PM  Radiology DG Chest 2 View Result Date: 05/26/2023 CLINICAL DATA:  Chest pain. EXAM: CHEST - 2 VIEW COMPARISON:  01/13/2020. FINDINGS: Low lung volume. Bilateral lung fields are clear. Bilateral costophrenic angles are clear. Normal cardio-mediastinal silhouette. No acute osseous abnormalities. The soft tissues are within normal limits. IMPRESSION: No active cardiopulmonary disease. Electronically Signed   By: Jules Schick M.D.   On: 05/26/2023 15:08    Procedures Procedures    Medications Ordered in ED Medications  ketorolac (TORADOL) 30 MG/ML injection 30 mg (30 mg Intravenous Given 05/26/23 1424)    ED Course/ Medical Decision Making/ A&P Clinical Course as of 05/26/23 1726  Fri May 26, 2023  1349 Chest x-ray interpreted by me as no acute infiltrate.  Awaiting radiology reading. [MB]    Clinical Course User Index [MB] Terrilee Files, MD                                 Medical Decision Making Amount and/or Complexity of Data Reviewed Labs: ordered. Radiology: ordered.  Risk Prescription drug management.   This patient complains of substernal and right-sided chest pain; this involves an extensive number of treatment Options and is a complaint that carries with it a high risk of complications and morbidity. The differential includes ACS, pneumonia, pneumothorax, reflux, musculoskeletal, PE  I ordered, reviewed and interpreted labs, which included CBC normal chemistries fairly unremarkable troponins flat LFTs and lipase  normal I ordered medication IV Toradol and reviewed PMP when indicated. I ordered imaging studies which included chest x-ray and I independently    visualized and interpreted imaging which showed no acute findings Previous records obtained and reviewed in epic, patient seen in December for epigastric pain Cardiac monitoring reviewed, normal sinus rhythm Social determinants considered, tobacco use, depression Critical Interventions: None  After the interventions stated above, I reevaluated the patient and found patient's symptoms to be improved and hemodynamically stable Admission and further testing considered, no indications for admission.  Recommended close follow-up with PCP.  Return instructions discussed  Final Clinical Impression(s) / ED Diagnoses Final diagnoses:  Nonspecific chest pain    Rx / DC Orders ED Discharge Orders     None         Terrilee Files, MD 05/26/23 1728

## 2023-05-26 NOTE — ED Triage Notes (Signed)
Pt arrived POV with c/o right sided chest since yesterday that radiates around her back and right  arm. No vomiting but some nausea today.

## 2023-05-26 NOTE — Discharge Instructions (Signed)
You were seen in the emergency department for some right sided chest pain.  You had lab work EKG and chest x-ray that did not show any evidence of heart injury.  Please continue your regular medications and follow-up with your primary care doctor.  Return to the emergency department if any worsening or concerning symptoms

## 2023-06-02 ENCOUNTER — Ambulatory Visit (HOSPITAL_BASED_OUTPATIENT_CLINIC_OR_DEPARTMENT_OTHER)
Admission: RE | Admit: 2023-06-02 | Discharge: 2023-06-02 | Disposition: A | Discharge status: Home / Self Care | Payer: 59 | Source: Ambulatory Visit | Attending: Orthopaedic Surgery | Admitting: Orthopaedic Surgery

## 2023-06-02 DIAGNOSIS — M25562 Pain in left knee: Secondary | ICD-10-CM | POA: Insufficient documentation

## 2023-06-02 DIAGNOSIS — M1712 Unilateral primary osteoarthritis, left knee: Secondary | ICD-10-CM | POA: Diagnosis not present

## 2023-06-02 DIAGNOSIS — G8929 Other chronic pain: Secondary | ICD-10-CM | POA: Diagnosis present

## 2023-06-02 DIAGNOSIS — M23342 Other meniscus derangements, anterior horn of lateral meniscus, left knee: Secondary | ICD-10-CM | POA: Diagnosis not present

## 2023-06-19 NOTE — Progress Notes (Unsigned)
 GI Office Note    Referring Provider: Billie Lade, MD Primary Care Physician:  Billie Lade, MD  Primary Gastroenterologist:  Chief Complaint   No chief complaint on file.   History of Present Illness   Renee [name] is a 43 y.o. female presenting today for abdominal pain.  Patient was last seen October 2023.  She has a history of acute on chronic pancreatitis/dilated pancreatic duct with atrophy of the body and the tail of the pancreas due to ductal obstruction in the setting of the pancreas.  Chronic hypercalcemia may be contributing to the overall picture.  History of hyperparathyroidism and advised need for surgery but previously unable to complete due to financial situation.  EUS with Dr. Rhetta Mura 03/2022 with plans for follow-up and possible ERCP.  Noted to have gastritis and reflux esophagitis.  She did not make follow-up appointment with Dr. Meridee Score   CT abdomen pelvis with contrast January 2024:  IMPRESSION: 1. No acute findings are noted in the abdomen or pelvis to account for the patient's symptoms. 2. Large calcification in the body of the pancreas with evidence of chronic proximal ductal obstruction and chronic atrophy throughout the body and tail of the pancreas, similar to prior studies, likely sequela of chronic pancreatitis. 3. Nonobstructive calculi in the right renal collecting system measuring up to 4 mm in the interpolar region. 4. New small right ovarian lesion, likely a hemorrhagic cyst. Follow-up pelvic ultrasound is recommended in 3-6 months to ensure the regression of this finding.   MR abdomen/MRCP with and without contrast October 2023: IMPRESSION: 1. Findings of acute on chronic pancreatitis with ductal obstruction in the central pancreas in the pancreatic body, resultant near complete atrophy of peripheral pancreas as seen on recent CT imaging. No acute pancreatic fluid collection. No signs of splenic vein or portal vein  abnormality. Known intraductal calculus not seen currently. Preservation of T1 signal 2. Constellation of findings may be due to previous pancreatitis with ductal stricture and subsequent stone formation. Alternatively, occult neoplasm leading to ductal obstruction and these findings cannot be excluded on the basis of this exam. Follow-up endoscopic assessment may be helpful to exclude small occult neoplasm. 3. Mild hepatic steatosis. 4. 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 Compatible with a benign finding for which no additional dedicated follow-up imaging is suggested. Based on current clinical literature, biochemical evaluation to exclude possible functioning adrenal nodule is suggested if not already performed. Please refer to current clinical guidelines for detailed recommendations. NEJM 1610:960 154-51      Medications   Current Outpatient Medications  Medication Sig Dispense Refill   amLODipine (NORVASC) 10 MG tablet Take 1 tablet (10 mg total) by mouth daily. 90 tablet 3   carvedilol (COREG) 12.5 MG tablet TAKE 1 TABLET (12.5MG  TOTAL) BY MOUTH TWICE A DAY WITH MEALS 180 tablet 1   losartan (COZAAR) 100 MG tablet Take 1 tablet (100 mg total) by mouth daily. 90 tablet 3   naproxen (NAPROSYN) 375 MG tablet Take 1 tablet (375 mg total) by mouth 2 (two) times daily with a meal. 60 tablet 5   No current facility-administered medications for this visit.    Allergies   Allergies as of 06/20/2023   (No Known Allergies)     Past Medical History   Past Medical History:  Diagnosis Date   Elevated serum creatinine 02/19/2021   Herpes simplex virus (HSV) infection    no OB +HSV 05/04/16 at [facility]    Hypertension  Hypothyroidism    Pancreatitis    Vaginal Pap smear, abnormal     Past Surgical History   Past Surgical History:  Procedure Laterality Date   BIOPSY  03/31/2022   Procedure: BIOPSY;  Surgeon: Meridee Score [name], MD;  Location: [facility];  Service:  Gastroenterology;;   CHOLECYSTECTOMY N/A 04/13/2020   Procedure: LAPAROSCOPIC CHOLECYSTECTOMY WITH INTRAOPERATIVE CHOLANGIOGRAM;  Surgeon: Darnell Level, MD;  Location: [facility];  Service: General;  Laterality: N/A;   ESOPHAGOGASTRODUODENOSCOPY (EGD) WITH PROPOFOL N/A 03/31/2022   Procedure: ESOPHAGOGASTRODUODENOSCOPY (EGD) WITH PROPOFOL;  Surgeon: Lemar Lofty., MD;  Location: [facility];  Service: Gastroenterology;  Laterality: N/A;   EUS N/A 03/31/2022   Procedure: UPPER ENDOSCOPIC ULTRASOUND (EUS) LINEAR;  Surgeon: Lemar Lofty., MD;  Location: [facility];  Service: Gastroenterology;  Laterality: N/A;   KNEE SURGERY     TONSILLECTOMY      Past Family History   Family History  Problem Relation Age of Onset   Congestive Heart Failure Maternal Grandmother    Hypertension Maternal Grandmother    Diabetes Maternal Grandmother    Other Maternal Grandfather        aneursym   Kidney disease Maternal Grandfather        on dialysis   Congestive Heart Failure Maternal Grandfather    Diabetes Maternal Grandfather     Past Social History   Social History   Socioeconomic History   Marital status: Single    Spouse name: Not on file   Number of children: Not on file   Years of education: Not on file   Highest education level: 12th grade  Occupational History   Not on file  Tobacco Use   Smoking status: Some Days    Current packs/day: 0.00    Average packs/day: 0.3 packs/day for 17.0 years (4.3 ttl pk-yrs)    Types: Cigarettes    Start date: 01/09/2005    Last attempt to quit: 01/09/2022    Years since quitting: 1.4   Smokeless tobacco: Never   Tobacco comments:    smokes 2-3 cig daily  Vaping Use   Vaping status: Never Used  Substance and Sexual Activity   Alcohol use: Not Currently    Comment: socially   Drug use: Not Currently    Types: Marijuana    Comment: occasionally   Sexual activity: Yes    Birth control/protection: None  Other Topics Concern    Not on file  Social History Narrative   Not on file   Social Drivers of Health   Financial Resource Strain: Medium Risk (03/17/2023)   Overall Financial Resource Strain (CARDIA)    Difficulty of Paying Living Expenses: Somewhat hard  Food Insecurity: Patient Declined (03/17/2023)   Hunger Vital Sign    Worried About Running Out of Food in the Last Year: Patient declined    Ran Out of Food in the Last Year: Patient declined  Transportation Needs: Patient Declined (03/17/2023)   PRAPARE - Administrator, Civil Service (Medical): Patient declined    Lack of Transportation (Non-Medical): Patient declined  Physical Activity: Unknown (03/17/2023)   Exercise Vital Sign    Days of Exercise per Week: 0 days    Minutes of Exercise per Session: Not on file  Stress: No Stress Concern Present (03/17/2023)   [employer] of Occupational Health - Occupational Stress Questionnaire    Feeling of Stress : Only a little  Social Connections: Unknown (03/17/2023)   Social Connection and Isolation Panel [NHANES]  Frequency of Communication with Friends and Family: More than three times a week    Frequency of Social Gatherings with Friends and Family: More than three times a week    Attends Religious Services: 1 to 4 times per year    Active Member of [employer] or Organizations: No    Attends Banker Meetings: Not on file    Marital Status: Patient declined  Intimate Partner Violence: Not At Risk (01/11/2022)   Humiliation, Afraid, Rape, and Kick questionnaire    Fear of Current or Ex-Partner: No    Emotionally Abused: No    Physically Abused: No    Sexually Abused: No    Review of Systems   General: Negative for anorexia, weight loss, fever, chills, fatigue, weakness. ENT: Negative for hoarseness, difficulty swallowing , nasal congestion. CV: Negative for chest pain, angina, palpitations, dyspnea on exertion, peripheral edema.  Respiratory: Negative for dyspnea at rest,  dyspnea on exertion, cough, sputum, wheezing.  GI: See history of present illness. GU:  Negative for dysuria, hematuria, urinary incontinence, urinary frequency, nocturnal urination.  Endo: Negative for unusual weight change.     Physical Exam   LMP 05/11/2023 (Approximate)    General: Well-nourished, well-developed in no acute distress.  Eyes: No icterus. Mouth: Oropharyngeal mucosa moist and pink , no lesions erythema or exudate. Lungs: Clear to auscultation bilaterally.  Heart: Regular rate and rhythm, no murmurs rubs or gallops.  Abdomen: Bowel sounds are normal, nontender, nondistended, no hepatosplenomegaly or masses,  no abdominal bruits or hernia , no rebound or guarding.  Rectal: ***  Extremities: No lower extremity edema. No clubbing or deformities. Neuro: Alert and oriented x 4   Skin: Warm and dry, no jaundice.   Psych: Alert and cooperative, normal mood and affect.  Labs   Lab Results  Component Value Date   LIPASE 34 05/26/2023   Lab Results  Component Value Date   NA 133 (L) 05/26/2023   CL 105 05/26/2023   K 3.6 05/26/2023   CO2 21 (L) 05/26/2023   BUN 10 05/26/2023   CREATININE 0.67 05/26/2023   GFRNONAA >60 05/26/2023   CALCIUM 10.7 (H) 05/26/2023   PHOS 2.0 (L) 01/12/2022   ALBUMIN 3.7 05/26/2023   GLUCOSE 108 (H) 05/26/2023   Lab Results  Component Value Date   ALT 15 05/26/2023   AST 16 05/26/2023   ALKPHOS 74 05/26/2023   BILITOT 0.6 05/26/2023   Lab Results  Component Value Date   WBC 7.9 05/26/2023   HGB 13.0 05/26/2023   HCT 39.2 05/26/2023   MCV 86.2 05/26/2023   PLT 342 05/26/2023    Imaging Studies   MR Knee Left  Wo Contrast Result Date: 06/11/2023 CLINICAL DATA:  Left knee pain status post ACL repair in 1998 EXAM: MRI OF THE LEFT KNEE WITHOUT CONTRAST TECHNIQUE: Multiplanar, multisequence MR imaging of the knee was performed. No intravenous contrast was administered. COMPARISON:  12/11/2015 FINDINGS: MENISCI Medial: Severe  attenuation of body and posterior horn of the medial meniscus which may reflect prior meniscectomy versus extensive chronic tear. Lateral: Maceration of the anterior horn lateral meniscus. LIGAMENTS Cruciates: Prior ACL repair with the ACL graft intact.  Intact PCL. Collaterals: Medial collateral ligament is intact. Lateral collateral ligament complex is intact. CARTILAGE Patellofemoral: Extensive full-thickness cartilage loss of the lateral patellofemoral compartment and moderate partial-thickness cartilage loss of the medial patellofemoral compartment. Medial: Extensive full-thickness cartilage loss of the weight-bearing medial femorotibial compartment. Lateral: Full-thickness cartilage loss of the weight-bearing lateral  femorotibial compartment. JOINT: A small joint effusion. Normal Hoffa's fat-pad. No plical thickening. POPLITEAL FOSSA: Popliteus tendon is intact. No Baker's cyst. EXTENSOR MECHANISM: Intact quadriceps tendon. Intact patellar tendon. Intact lateral patellar retinaculum. Intact medial patellar retinaculum. Intact MPFL. BONES: No aggressive osseous lesion. No fracture or dislocation. Tricompartmental marginal osteophytes. Other: No fluid collection or hematoma. Muscles are normal. IMPRESSION: 1. Prior ACL repair with the ACL graft intact. 2. Severe attenuation of body and posterior horn of the medial meniscus which may reflect prior meniscectomy versus extensive chronic tear. 3. Maceration of the anterior horn lateral meniscus. 4. 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 Electronically Signed   By: Elige Ko M.D.   On: 06/11/2023 08:07   DG Chest 2 View Result Date: 05/26/2023 CLINICAL DATA:  Chest pain. EXAM: CHEST - 2 VIEW COMPARISON:  01/13/2020. FINDINGS: Low lung volume. Bilateral lung fields are clear. Bilateral costophrenic angles are clear. Normal cardio-mediastinal silhouette. No acute osseous abnormalities. The soft tissues are within normal limits. IMPRESSION: No  active cardiopulmonary disease. Electronically Signed   By: Jules Schick M.D.   On: 05/26/2023 15:08    Assessment       PLAN   ***   Leanna Battles. Melvyn Neth, MHS, PA-C [facility]

## 2023-06-20 ENCOUNTER — Telehealth: Payer: Self-pay | Admitting: Gastroenterology

## 2023-06-20 ENCOUNTER — Ambulatory Visit (INDEPENDENT_AMBULATORY_CARE_PROVIDER_SITE_OTHER): Admitting: Gastroenterology

## 2023-06-20 ENCOUNTER — Encounter: Payer: Self-pay | Admitting: Gastroenterology

## 2023-06-20 VITALS — BP 132/87 | HR 83 | Temp 97.2°F | Ht 63.0 in | Wt 245.4 lb

## 2023-06-20 DIAGNOSIS — R1013 Epigastric pain: Secondary | ICD-10-CM

## 2023-06-20 DIAGNOSIS — K8681 Exocrine pancreatic insufficiency: Secondary | ICD-10-CM

## 2023-06-20 DIAGNOSIS — K861 Other chronic pancreatitis: Secondary | ICD-10-CM

## 2023-06-20 DIAGNOSIS — K8689 Other specified diseases of pancreas: Secondary | ICD-10-CM

## 2023-06-20 DIAGNOSIS — R195 Other fecal abnormalities: Secondary | ICD-10-CM

## 2023-06-20 MED ORDER — PANCRELIPASE (LIP-PROT-AMYL) 36000-114000 UNITS PO CPEP: ORAL_CAPSULE | ORAL | 5 refills | Status: DC

## 2023-06-20 MED ORDER — PANTOPRAZOLE SODIUM 40 MG PO TBEC: 40.0000 mg | DELAYED_RELEASE_TABLET | Freq: Every day | ORAL | 3 refills | Status: DC

## 2023-06-20 NOTE — Telephone Encounter (Signed)
 Pt seen today and forgot to ask her if she has had follow up of right ovarian cyst seen on CT 04/2022. Radiologist advised follow up pelvic u/s to make sure cyst resolved.  If she has not had it, please schedule pelvic u/s.  Also make sure she is NIC for ov six months

## 2023-06-20 NOTE — Patient Instructions (Signed)
 Start pantoprazole 40 mg daily before breakfast.  Prescription sent to pharmacy. Start Creon 36,000 units, take 2 capsules with meals and 1 capsule with snacks.  Take with first bite of food.  Samples provided, prescription sent to pharmacy. Avoid fatty meals. Let me know if your symptoms do not settle down. We will refer you back to Dr. Meridee Score to consider if trying to take out the pancreatic duct stone could be helpful.

## 2023-06-20 NOTE — Telephone Encounter (Signed)
 Lmom for pt to return call

## 2023-06-21 ENCOUNTER — Encounter: Payer: Self-pay | Admitting: Orthopaedic Surgery

## 2023-06-21 ENCOUNTER — Other Ambulatory Visit: Payer: Self-pay | Admitting: *Deleted

## 2023-06-21 ENCOUNTER — Ambulatory Visit: Payer: 59 | Admitting: Orthopaedic Surgery

## 2023-06-21 VITALS — BP 132/87 | Ht 63.0 in | Wt 245.0 lb

## 2023-06-21 DIAGNOSIS — M1712 Unilateral primary osteoarthritis, left knee: Secondary | ICD-10-CM | POA: Diagnosis not present

## 2023-06-21 DIAGNOSIS — N83201 Unspecified ovarian cyst, right side: Secondary | ICD-10-CM

## 2023-06-21 DIAGNOSIS — G8929 Other chronic pain: Secondary | ICD-10-CM

## 2023-06-21 NOTE — Progress Notes (Signed)
 My knee is sore.  She had MRI of the left knee.  It showed: IMPRESSION: 1. Prior ACL repair with the ACL graft intact. 2. Severe attenuation of body and posterior horn of the medial meniscus which may reflect prior meniscectomy versus extensive chronic tear. 3. Maceration of the anterior horn lateral meniscus. 4. 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  I have explained the findings to her.  I will have her see Dr. Romeo Apple for his evaluation.  I have independently reviewed the MRI.    Left knee has effusion, ROM 0 to 95, limp left, crepitus, more medial pain, NV intact, no distal edema.  Encounter Diagnosis  Name Primary?   Chronic pain of left knee Yes   To see Dr. Romeo Apple.  Call if any problem.  Precautions discussed.  Electronically Signed Darreld Mclean, MD 3/12/202510:18 AM

## 2023-06-21 NOTE — Addendum Note (Signed)
 Addended by: Elinor Dodge on: 06/21/2023 12:44 PM   Modules accepted: Orders

## 2023-06-21 NOTE — Telephone Encounter (Signed)
 Pt has not followed up on it. Is ok with moving forward with scheduling [place].   Mandy: please make sure ov is nic for 6 mths.

## 2023-06-21 NOTE — Telephone Encounter (Signed)
 Pt informed of [place] scheduled for Monday 06/26/23, arrive at 3:15 pm to check in, drink 32oz of water 1 hour prior to [place] and to come with a full bladder. Verbalized understanding.

## 2023-06-26 ENCOUNTER — Ambulatory Visit (HOSPITAL_COMMUNITY)
Admission: RE | Admit: 2023-06-26 | Discharge: 2023-06-26 | Disposition: A | Discharge status: Home / Self Care | Source: Ambulatory Visit | Attending: Gastroenterology | Admitting: Gastroenterology

## 2023-06-26 DIAGNOSIS — N83201 Unspecified ovarian cyst, right side: Secondary | ICD-10-CM | POA: Insufficient documentation

## 2023-06-27 ENCOUNTER — Telehealth: Payer: Self-pay | Admitting: Gastroenterology

## 2023-06-27 NOTE — Telephone Encounter (Signed)
 I am happy to meet the patient to talk with her. She had a previous EUS that showed evidence of a pancreatic duct stone causing pancreatic duct dilation. If she is having symptoms that they are concerned may need an ERCP, I would like to see her in clinic to discuss the procedure and discuss the ultimate goal that we would be trying to achieve if we were to attempt a pancreatic ERCP. She can be scheduled in clinic with me (not an overbook slot or held slot) but as a open patient visit. Once the clinic appointment has been made, please make sure that the referral in the system is acknowledged. Thanks. GM

## 2023-06-27 NOTE — Telephone Encounter (Signed)
 Good morning Dr. Meridee Score,     Patient called stating that her current GI provider at [facility] had sent over a referral to you to be seen for a ERCP.  Will you please review patients referral and advise on scheduling patient?   Thank you.

## 2023-06-30 ENCOUNTER — Ambulatory Visit (INDEPENDENT_AMBULATORY_CARE_PROVIDER_SITE_OTHER): Admitting: Orthopedic Surgery

## 2023-06-30 ENCOUNTER — Encounter: Payer: Self-pay | Admitting: Nurse Practitioner

## 2023-06-30 ENCOUNTER — Telehealth: Payer: Self-pay | Admitting: Orthopedic Surgery

## 2023-06-30 ENCOUNTER — Ambulatory Visit: Payer: Self-pay | Admitting: Nurse Practitioner

## 2023-06-30 VITALS — BP 132/87 | Ht 63.0 in | Wt 247.0 lb

## 2023-06-30 VITALS — BP 108/78 | HR 70 | Ht 63.0 in | Wt 247.4 lb

## 2023-06-30 DIAGNOSIS — E559 Vitamin D deficiency, unspecified: Secondary | ICD-10-CM | POA: Diagnosis not present

## 2023-06-30 DIAGNOSIS — E213 Hyperparathyroidism, unspecified: Secondary | ICD-10-CM

## 2023-06-30 DIAGNOSIS — G8929 Other chronic pain: Secondary | ICD-10-CM

## 2023-06-30 DIAGNOSIS — M1712 Unilateral primary osteoarthritis, left knee: Secondary | ICD-10-CM | POA: Diagnosis not present

## 2023-06-30 NOTE — Telephone Encounter (Signed)
 Needs disk with xrays to take to [facility] with her for appointment Please let her know when its ready for pick up

## 2023-06-30 NOTE — Progress Notes (Signed)
 Office Visit Note   Patient: Renee [name]           Date of Birth: April 23, 1980           MRN: [id number] Visit Date: 06/30/2023 Requested by: Billie Lade, MD [address] [place],  [place] [address] PCP: Billie Lade, MD   Assessment & Plan:   43 year old [place] female end-stage arthritis moderate obesity end-stage disease requiring knee replacement for definitive management however, I would not feel comfortable performing this procedure that needs a revision most likely within the next 10 to 15 years  I am referring her to a joint replacement specialist  I do not think she is a candidate for HA injections because of the [employer] health plan  So she is basically left with anti-inflammatories, tramadol and weight loss and physical therapy which based on x-ray and clinical findings will not help her knee pain  Encounter Diagnoses  Name Primary?   Chronic pain of left knee Yes   Unilateral primary osteoarthritis, left knee     No orders of the defined types were placed in this encounter.     Subjective: Chief Complaint  Patient presents with   Knee Pain    Left    Results    Review MRI ordered by Dr Hilda Lias     HPI: 43 year old female complaints of pain catching locking giving way left knee status post ACL reconstruction as a teenager.  At that time she was told she would need a knee replacement in the future according to her  She got 1 injection in her left knee she was sent for MRI when it did not help  Her MRI came back with extensive cartilage loss throughout the joint.  Imaging studies show osteoarthritis varus deformity multiple osteophytes severe narrowing medial compartment              ROS: Currently negative   Images personally read and my interpretation : MRI was reviewed primary findings are the extensive cartilage loss throughout the joint  Visit Diagnoses:  1. Chronic pain of left knee   2. Unilateral primary osteoarthritis, left knee       Follow-Up Instructions: No follow-ups on file.    Objective: Vital Signs: BP 132/87   Ht 5\' 3"  (1.6 m)   Wt 247 lb (112 kg)   LMP 06/13/2023 (Approximate)   BMI 43.75 kg/m   Ortho Exam Left knee varus deformity slight flexion contracture knee flexion up to 110 degrees large anterior scar from probable patella tendon autograft from her ACL surgery.  Crepitance on range of motion medial compartment pain and tenderness  Specialty Comments:  No specialty comments available.  Imaging: No results found.   PMFS History: Patient Active Problem List   Diagnosis Date Noted   Abdominal pain, epigastric 06/20/2023   Loose stools 06/20/2023   Chronic pancreatitis (HCC) 06/20/2023   Loud snoring 04/28/2022   Anxiety and depression 04/14/2022   Constipation 02/08/2022   History of vitamin D deficiency 01/26/2022   Risk factors for obstructive sleep apnea 01/26/2022   Positive screening for depression on 9-item Patient Health Questionnaire (PHQ-9) 01/26/2022   Encounter to establish care 01/26/2022   Elevated LFTs    Essential hypertension 01/12/2022   UTI (urinary tract infection) 01/12/2022   Pancreatic duct dilated    Acute on chronic pancreatitis (HCC) 01/11/2022   Hypercalcemia 02/19/2021   Elevated serum creatinine 02/19/2021   Screening mammogram for breast cancer 02/16/2021  Encounter for screening fecal occult blood testing 02/16/2021   Encounter for gynecological examination with Papanicolaou smear of cervix 02/16/2021   Cholelithiasis with chronic cholecystitis 04/13/2020   Cholelithiasis with cholecystitis 04/02/2020   Hyperparathyroidism, primary (HCC) 04/02/2020   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/2015   Past Medical History:  Diagnosis Date   Elevated serum creatinine 02/19/2021   Herpes simplex virus (HSV) infection    no OB +HSV 05/04/16 at [facility]    Hypertension    Hypothyroidism    Pancreatitis    Vaginal Pap smear, abnormal     Family History  Problem  Relation Age of Onset   Congestive Heart Failure Maternal Grandmother    Hypertension Maternal Grandmother    Diabetes Maternal Grandmother    Colon polyps Maternal Grandmother    Other Maternal Grandfather        aneursym   Kidney disease Maternal Grandfather        on dialysis   Congestive Heart Failure Maternal Grandfather    Diabetes Maternal Grandfather    Colon polyps Maternal Grandfather     Past Surgical History:  Procedure Laterality Date   BIOPSY  03/31/2022   Procedure: BIOPSY;  Surgeon: Meridee Score, [name], MD;  Location: [facility];  Service: Gastroenterology;;   CHOLECYSTECTOMY N/A 04/13/2020   Procedure: LAPAROSCOPIC CHOLECYSTECTOMY WITH INTRAOPERATIVE CHOLANGIOGRAM;  Surgeon: Darnell Level, MD;  Location: [facility];  Service: General;  Laterality: N/A;   ESOPHAGOGASTRODUODENOSCOPY (EGD) WITH PROPOFOL N/A 03/31/2022   Procedure: ESOPHAGOGASTRODUODENOSCOPY (EGD) WITH PROPOFOL;  Surgeon: Lemar Lofty., MD;  Location: [facility];  Service: Gastroenterology;  Laterality: N/A;   EUS N/A 03/31/2022   Procedure: UPPER ENDOSCOPIC ULTRASOUND (EUS) LINEAR;  Surgeon: Lemar Lofty., MD;  Location: [facility];  Service: Gastroenterology;  Laterality: N/A;   KNEE SURGERY     TONSILLECTOMY     Social History   Occupational History   Not on file  Tobacco Use   Smoking status: Some Days    Current packs/day: 0.00    Average packs/day: 0.3 packs/day for 17.0 years (4.3 ttl pk-yrs)    Types: Cigarettes    Start date: 01/09/2005    Last attempt to quit: 01/09/2022    Years since quitting: 1.4   Smokeless tobacco: Never   Tobacco comments:    smokes 2-3 cig daily  Vaping Use   Vaping status: Never Used  Substance and Sexual Activity   Alcohol use: Not Currently    Comment: socially   Drug use: Not Currently    Types: Marijuana    Comment: occasionally   Sexual activity: Yes    Birth control/protection: None

## 2023-06-30 NOTE — Patient Instructions (Signed)
 Hypercalcemia Hypercalcemia is when the level of calcium in a person's blood is above normal. The body needs calcium to make bones and keep them strong. Calcium also helps the muscles, nerves, brain, and heart work the way they should. Most of the calcium in the body is stored in the bones. There is also calcium in the blood. Hypercalcemia occurs when there is too much calcium in your blood. Calcium levels in the blood are regulated by hormones, kidneys, and the gastrointestinal tract.  Hypercalcemia can happen when calcium comes out of the bones, or when the kidneys are not able to remove calcium from the blood. Hypercalcemia can be mild or severe. What are the causes? There are many possible causes of hypercalcemia. Common causes of this condition include: Hyperparathyroidism. This is a condition in which the body produces too much parathyroid hormone. There are four parathyroid glands in your neck. These glands produce a chemical messenger (hormone) that helps the body absorb calcium from foods and helps your bones release calcium. Certain kinds of cancer. Less common causes of hypercalcemia include: Calcium and vitamin D dietary supplements. Chronic kidney disease. Hyperthyroidism. Severe dehydration. Being on bed rest or being inactive for a long time. Certain medicines. Infections. What increases the risk? You are more likely to develop this condition if: You are female. You are 42 years of age or older. You have a family history of hypercalcemia. What are the signs or symptoms? Mild hypercalcemia that starts slowly may not cause symptoms. Severe, sudden hypercalcemia is more likely to cause symptoms, such as: Being more thirsty than usual. Needing to urinate more often than usual. Abdominal pain. Nausea and vomiting. Constipation. Muscle pain, twitching, or weakness. Feeling very tired. How is this diagnosed?  Hypercalcemia is usually diagnosed with a blood test. You may also  have tests to help check what is causing this condition. Tests include imaging tests and more blood tests. How is this treated? Treatment for hypercalcemia depends on the cause. Treatment may include: Receiving fluids through an IV. Medicines. These can be used to: Keep calcium levels steady after receiving fluids (loop diuretics). Keep calcium in your bones (bisphosphonates). Lower the calcium level in your blood. Surgery to remove overactive parathyroid glands. A procedure that filters your blood to correct calcium levels (hemodialysis). Follow these instructions at home:  Take over-the-counter and prescription medicines only as told by your health care provider. Follow instructions from your health care provider about eating or drinking restrictions. Drink enough fluid to keep your urine pale yellow. Stay active. Weight-bearing exercise helps to keep calcium in your bones. Follow instructions from your health care provider about what type and level of exercise is safe for you. Keep all follow-up visits. This is important. Contact a health care provider if: You have a fever. Your heartbeat is irregular or very fast. You have changes in mood, memory, or personality. Get help right away if: You have severe abdominal pain. You have chest pain. You have trouble breathing. You become very confused and sleepy. You lose consciousness. These symptoms may represent a serious problem that is an emergency. Do not wait to see if the symptoms will go away. Get medical help right away. Call your local emergency services (911 in the [place] Do not drive yourself to the hospital. Summary Hypercalcemia is when the level of calcium in a person's blood is above normal. The body needs calcium to make bones and keep them strong. There are many possible causes of hypercalcemia, and treatment depends on  the cause. Take over-the-counter and prescription medicines only as told by your health care  provider. This information is not intended to replace advice given to you by your health care provider. Make sure you discuss any questions you have with your health care provider. Document Revised: 09/02/2020 Document Reviewed: 09/02/2020 Elsevier Patient Education  2024 [employer].

## 2023-06-30 NOTE — Patient Instructions (Signed)
 I will send your referral to [facility] orthopedics you can call them to schedule the number is [phone]. They will need you to get a CD of the xray images of the MRI  from [facility]. The number to call for the CD is [phone] ask for [facility]  We will call you when CD of your xrays is ready for pick up, they will need it also.

## 2023-06-30 NOTE — Progress Notes (Signed)
 Endocrinology Consult Note       06/30/2023, 9:15 AM  SUBJECTIVE:  Renee [name] is a 43 y.o.-year-old female, referred by her  Billie Lade, MD  , for evaluation for hypercalcemia/hyperparathyroidism.  She previously saw Dr. Talmage Nap for same issue but her insurance is no longer accepted at their clinic.  She was also referred to Dr. Gerrit Friends back in 2022 for possible parathyroidectomy but her case was complicated by gallstone pancreatitis, therefore she had cholecystectomy first.  She opted not to have the parathyroid gland and her gall bladder removed at the same time.  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   Past Medical History:  Diagnosis Date   Elevated serum creatinine 02/19/2021   Herpes simplex virus (HSV) infection    no OB +HSV 05/04/16 at [facility]    Hypertension    Hypothyroidism    Pancreatitis    Vaginal Pap smear, abnormal     Past Surgical History:  Procedure Laterality Date   BIOPSY  03/31/2022   Procedure: BIOPSY;  Surgeon: Lemar Lofty., MD;  Location: [facility];  Service: Gastroenterology;;   CHOLECYSTECTOMY N/A 04/13/2020   Procedure: LAPAROSCOPIC CHOLECYSTECTOMY WITH INTRAOPERATIVE CHOLANGIOGRAM;  Surgeon: Darnell Level, MD;  Location: [facility];  Service: General;  Laterality: N/A;   ESOPHAGOGASTRODUODENOSCOPY (EGD) WITH PROPOFOL N/A 03/31/2022   Procedure: ESOPHAGOGASTRODUODENOSCOPY (EGD) WITH PROPOFOL;  Surgeon: Lemar Lofty., MD;  Location: [facility];  Service: Gastroenterology;  Laterality: N/A;   EUS N/A 03/31/2022   Procedure: UPPER ENDOSCOPIC ULTRASOUND (EUS) LINEAR;  Surgeon: Lemar Lofty., MD;  Location: [facility];  Service: Gastroenterology;  Laterality: N/A;   KNEE SURGERY     TONSILLECTOMY      Social History   Tobacco Use   Smoking status:  Some Days    Current packs/day: 0.00    Average packs/day: 0.3 packs/day for 17.0 years (4.3 ttl pk-yrs)    Types: Cigarettes    Start date: 01/09/2005    Last attempt to quit: 01/09/2022    Years since quitting: 1.4   Smokeless tobacco: Never   Tobacco comments:    smokes 2-3 cig daily  Vaping Use   Vaping status: Never Used  Substance Use Topics   Alcohol use: Not Currently    Comment: socially   Drug use: Not Currently    Types: Marijuana    Comment: occasionally    Family History  Problem Relation Age of Onset   Congestive Heart Failure Maternal Grandmother    Hypertension Maternal Grandmother    Diabetes Maternal Grandmother    Colon polyps Maternal Grandmother    Other Maternal Grandfather        aneursym   Kidney disease Maternal Grandfather        on dialysis   Congestive Heart Failure Maternal Grandfather    Diabetes Maternal Grandfather    Colon polyps Maternal Grandfather     Outpatient Encounter Medications as of 06/30/2023  Medication Sig   amLODipine (NORVASC) 10 MG tablet Take 1 tablet (10 mg total) by mouth daily.  carvedilol (COREG) 12.5 MG tablet TAKE 1 TABLET (12.5MG  TOTAL) BY MOUTH TWICE A DAY WITH MEALS   lipase/protease/amylase (CREON) 36000 UNITS CPEP capsule Take two capsules with each meal, and 1 capsule with each snack. Take with first bite of food.   losartan (COZAAR) 100 MG tablet Take 1 tablet (100 mg total) by mouth daily.   naproxen (NAPROSYN) 375 MG tablet Take 1 tablet (375 mg total) by mouth 2 (two) times daily with a meal.   [DISCONTINUED] pantoprazole (PROTONIX) 40 MG tablet Take 1 tablet (40 mg total) by mouth daily before breakfast. (Patient not taking: Reported on 06/30/2023)   No facility-administered encounter medications on file as of 06/30/2023.    No Known Allergies   HPI  Tanza K Egger was diagnosed with hypercalcemia in 2022.  Patient has no previously known history of parathyroid, pituitary, adrenal dysfunctions; no family  history of such dysfunctions. -Review of her referral package of most recent labs reveals calcium of 10.7, no corresponding PTH was performed at that time.   I reviewed pt's DEXA scans: 02/22/22 shows osteoporosis  No prior history of fragility fractures or falls. No history of kidney stones.  No history of CKD. Last BUN/Cr: 10/0.67  she is not on HCTZ or other thiazide therapy.  She does have history of vitamin D deficiency, not currently on any supplementation. Last vitamin D level was 25 in 01/21/22.  she is not on calcium supplements,  she eats dairy and green, leafy, vegetables on average amounts.  she does not have a family history of hypercalcemia, pituitary tumors, thyroid cancer, or osteoporosis.   I reviewed her chart and she also has a history of HTN, pancreatitis, osteoporosis.    Review of systems  Constitutional: + Minimally fluctuating body weight,  current Body mass index is 43.82 kg/m. , no fatigue, no subjective hyperthermia, no subjective hypothermia Eyes: no blurry vision, no xerophthalmia ENT: no sore throat, no nodules palpated in throat, no dysphagia/odynophagia, no hoarseness Cardiovascular: no chest pain, no shortness of breath, no palpitations, no leg swelling Respiratory: no cough, no shortness of breath Gastrointestinal: no nausea/vomiting/diarrhea Musculoskeletal: no muscle/joint aches Skin: no rashes, no hyperemia Neurological: no tremors, no numbness, no tingling, no dizziness Psychiatric: no depression, no anxiety   ------------------------------------------------------------------------------------------------------------------------------- OBJECTIVE: BP 108/78 (BP Location: Right Arm, Patient Position: Sitting, Cuff Size: Large)   Ht 5\' 3"  (1.6 m)   Wt 247 lb 6.4 oz (112.2 kg)   LMP 06/13/2023 (Approximate)   BMI 43.82 kg/m , Body mass index is 43.82 kg/m. Wt Readings from Last 3 Encounters:  06/30/23 247 lb 6.4 oz (112.2 kg)  06/21/23 245  lb (111.1 kg)  06/20/23 245 lb 6.4 oz (111.3 kg)    BP Readings from Last 3 Encounters:  06/30/23 108/78  06/21/23 132/87  06/20/23 132/87     Physical Exam- Limited  Constitutional:  Body mass index is 43.82 kg/m. , not in acute distress, normal state of mind Eyes:  EOMI, no exophthalmos Neck: Supple Thyroid: mild goiter, R>L, no palpable nodularity Cardiovascular: RRR, no murmurs, rubs, or gallops, no edema Respiratory: Adequate breathing efforts, no crackles, rales, rhonchi, or wheezing Musculoskeletal: no gross deformities, strength intact in all four extremities, no gross restriction of joint movements Skin:  no rashes, no hyperemia Neurological: no tremor with outstretched hands   CMP ( most recent) CMP     Component Value Date/Time   NA 133 (L) 05/26/2023 1249   NA 142 06/03/2022 1647   K 3.6 05/26/2023 1249  CL 105 05/26/2023 1249   CO2 21 (L) 05/26/2023 1249   GLUCOSE 108 (H) 05/26/2023 1249   BUN 10 05/26/2023 1249   BUN 8 06/03/2022 1647   CREATININE 0.67 05/26/2023 1249   CALCIUM 10.7 (H) 05/26/2023 1249   PROT 7.2 05/26/2023 1249   PROT 7.4 06/03/2022 1647   ALBUMIN 3.7 05/26/2023 1249   ALBUMIN 4.7 06/03/2022 1647   AST 16 05/26/2023 1249   ALT 15 05/26/2023 1249   ALKPHOS 74 05/26/2023 1249   BILITOT 0.6 05/26/2023 1249   BILITOT 0.3 06/03/2022 1647   EGFR 108 06/03/2022 1647   GFRNONAA >60 05/26/2023 1249     Diabetic Labs (most recent): Lab Results  Component Value Date   HGBA1C 5.6 01/21/2022     Lipid Panel ( most recent) Lipid Panel     Component Value Date/Time   CHOL 128 01/12/2022 0417   CHOL 129 02/16/2021 0936   TRIG 66 01/12/2022 0417   HDL 48 01/12/2022 0417   HDL 59 02/16/2021 0936   CHOLHDL 2.7 01/12/2022 0417   VLDL 13 01/12/2022 0417   LDLCALC 67 01/12/2022 0417   LDLCALC 53 02/16/2021 0936   LABVLDL 17 02/16/2021 0936      Lab Results  Component Value Date   TSH 1.900 01/21/2022   TSH 3.060 02/16/2021    FREET4 1.66 01/21/2022   FREET4 1.53 02/16/2021      Assessment/Plan: 1. Hypercalcemia / Hyperparathyroidism- primary  Patient has had several instances of elevated calcium, with the highest level being at 12.2 mg/dL back in 1610. A corresponding intact PTH level was also high, at 93.  - Patient also has vitamin D deficiency,  with the last level being 25 in 2023.  - She does have osteoporosis but no other apparent complications from hypercalcemia/hyperparathyroidism: no history of nephrolithiasis, fragility fractures. No abdominal pain, no major mood disorders, no bone pain.  - I discussed with the patient about the physiology of calcium and parathyroid hormone, and possible  effects of  increased PTH/ Calcium , including kidney stones, cardiac dysrhythmias, osteoporosis, abdominal pain, etc.   I will proceed to obtain repeat intact PTH/calcium, serum magnesium, serum phosphorus, vitamin D.  It is also essential to obtain 24-hour urine calcium/creatinine to rule out the rare but important cause of mild elevation in calcium and PTH- FHH ( Familial Hypocalciuric Hypercalcemia), which may not require any active intervention.  Once these results are reviewed, will re-initiate consult to Dr. Gerrit Friends for parathyroidectomy evaluation.    - Time spent with the patient: 45 minutes, of which >50% was spent in obtaining information about her symptoms, reviewing her previous labs, evaluations, and treatments, counseling her about her  hypercalcemia , and developing a plan to confirm the diagnosis and long term treatment as necessary.  Please refer to " Patient Self Inventory" in the Media  tab for reviewed elements of pertinent patient history.  Riniyah K Mcnamee participated in the discussions, expressed understanding, and voiced agreement with the above plans.  All questions were answered to her satisfaction. she is encouraged to contact clinic should she have any questions or concerns prior to her return  visit.   Follow Up Plan: - Return will call with lab results and 24 hr urine studies.   Ronny Bacon, [facility] [address] [place], [place] [address] Phone: [phone] Fax: [phone]  06/30/2023, 9:15 AM

## 2023-06-30 NOTE — Progress Notes (Signed)
  Intake history:  BP 132/87   Ht 5\' 3"  (1.6 m)   Wt 247 lb (112 kg)   LMP 06/13/2023 (Approximate)   BMI 43.75 kg/m  Body mass index is 43.75 kg/m.    WHAT ARE WE SEEING YOU FOR TODAY?   left knee(s)   How long has this bothered you? (DOI?DOS?WS?) In January dog knocked her over causing pain left knee   Anticoag.  No  Diabetes No  Heart disease No  Hypertension Yes  SMOKING HX Yes  Kidney disease No  Any ALLERGIES ______________________________________________   Treatment:  Have you taken:  Tylenol No  Advil No  Had PT No  Had injection Yes  Other  _____________Naprosyn/ and MRI ____________

## 2023-06-30 NOTE — Telephone Encounter (Signed)
 Dr. Meridee Score,    Patient has been scheduled with you for an office visit on 5/23 at 9:50. Patients referral was attached to appointment.

## 2023-07-03 NOTE — Telephone Encounter (Signed)
 I gave her the phone number to get the disk from [facility] they can get xrays also since they were done at [facility]

## 2023-07-06 ENCOUNTER — Ambulatory Visit: Admitting: Orthopedic Surgery

## 2023-07-19 ENCOUNTER — Ambulatory Visit (INDEPENDENT_AMBULATORY_CARE_PROVIDER_SITE_OTHER): Payer: Self-pay

## 2023-07-19 ENCOUNTER — Telehealth: Payer: Self-pay | Admitting: *Deleted

## 2023-07-19 ENCOUNTER — Telehealth: Payer: Self-pay | Admitting: Internal Medicine

## 2023-07-19 VITALS — BP 145/93 | HR 84 | Ht 62.0 in | Wt 245.1 lb

## 2023-07-19 DIAGNOSIS — Z6841 Body Mass Index (BMI) 40.0 and over, adult: Secondary | ICD-10-CM | POA: Diagnosis not present

## 2023-07-19 MED ORDER — SEMAGLUTIDE-WEIGHT MANAGEMENT 1.7 MG/0.75ML ~~LOC~~ SOAJ: 1.7000 mg | SUBCUTANEOUS | 0 refills | Status: DC

## 2023-07-19 MED ORDER — SEMAGLUTIDE-WEIGHT MANAGEMENT 0.5 MG/0.5ML ~~LOC~~ SOAJ: 0.5000 mg | SUBCUTANEOUS | 0 refills | Status: DC

## 2023-07-19 MED ORDER — SEMAGLUTIDE-WEIGHT MANAGEMENT 2.4 MG/0.75ML ~~LOC~~ SOAJ: 2.4000 mg | SUBCUTANEOUS | 0 refills | Status: DC

## 2023-07-19 MED ORDER — SEMAGLUTIDE-WEIGHT MANAGEMENT 0.25 MG/0.5ML ~~LOC~~ SOAJ: 0.2500 mg | SUBCUTANEOUS | 0 refills | Status: DC

## 2023-07-19 MED ORDER — SEMAGLUTIDE-WEIGHT MANAGEMENT 1 MG/0.5ML ~~LOC~~ SOAJ
1.0000 mg | SUBCUTANEOUS | 0 refills | Status: DC
Start: 2023-09-15 — End: 2023-07-25

## 2023-07-19 NOTE — Progress Notes (Unsigned)
 Established Patient Office Visit  Subjective   Patient ID: Renee [name], female    DOB: 25-Jul-1980  Age: 43 y.o. MRN: [id number]  Chief Complaint  Patient presents with   Weight Loss    Pt wants to discuss weight loss     HPI  Obesity: Patient complains of obesity. Patient cites increased physical ability, she needs to lose 30lbs before she can have knee replacement. as reasons for wanting to lose weight.  Obesity History Weight in late teens: *** lb. Period of greatest weight gain: *** lb during {age at onset:[age]} Lowest adult weight: *** Highest adult weight: *** Amount of time at present weight: *** lb.   History of Weight Loss Efforts Greatest amount of weight lost: *** lb over *** months Amount of time that loss was maintained: *** months Circumstances associated with regain of weight: *** Successful weight loss techniques attempted: {obesity treatment:12513} Unsuccessful weight loss techniques attempted: {obesity treatment:12513}  Current Exercise Habits {exercise types:16438}  Current Eating Habits Number of regular meals per day: {0-10:33138} Number of snacking episodes per day: {0-10:33138} Who shops for food? {companion:5061::"patient"} Who prepares food? {companion:5061::"patient"} Who eats with patient? {companion:5061::"patient"} Binge behavior?: {yes***/no:17258} Purge behavior? {yes***/no:17258} Anorexic behavior? {yes***/no:17258} Eating precipitated by stress? {yes***/no:17258} Guilt feelings associated with eating? {yes***/no:17258}  Other Potential Contributing Factors Use of alcohol: average *** drinks/week Use of medications that may cause weight gain {meds:16753} History of past abuse? {abuse JXBJ:47829} Psych History: {ros - neuro psych list:32387} Comorbidities: {assd:16751}   Patient Active Problem List   Diagnosis Date Noted   Abdominal pain, epigastric 06/20/2023   Loose stools 06/20/2023   Chronic pancreatitis (HCC) 06/20/2023    Loud snoring 04/28/2022   Anxiety and depression 04/14/2022   Constipation 02/08/2022   History of vitamin D deficiency 01/26/2022   Risk factors for obstructive sleep apnea 01/26/2022   Positive screening for depression on 9-item Patient Health Questionnaire (PHQ-9) 01/26/2022   Encounter to establish care 01/26/2022   Elevated LFTs    Essential hypertension 01/12/2022   UTI (urinary tract infection) 01/12/2022   Pancreatic duct dilated    Acute on chronic pancreatitis (HCC) 01/11/2022   Hypercalcemia 02/19/2021   Elevated serum creatinine 02/19/2021   Screening mammogram for breast cancer 02/16/2021   Encounter for screening fecal occult blood testing 02/16/2021   Encounter for gynecological examination with Papanicolaou smear of cervix 02/16/2021   Cholelithiasis with chronic cholecystitis 04/13/2020   Cholelithiasis with cholecystitis 04/02/2020   Hyperparathyroidism, primary (HCC) 04/02/2020   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/2015   Past Medical History:  Diagnosis Date   Elevated serum creatinine 02/19/2021   Herpes simplex virus (HSV) infection    no OB +HSV 05/04/16 at [facility]    Hypertension    Hypothyroidism    Pancreatitis    Vaginal Pap smear, abnormal       ROS    Objective:     BP (!) 145/93 (BP Location: Left Arm, Patient Position: Sitting, Cuff Size: Large)   Pulse 84   Ht 5\' 2"  (1.575 m)   Wt 245 lb 1.9 oz (111.2 kg)   LMP 06/13/2023 (Approximate)   SpO2 98%   BMI 44.83 kg/m  BP Readings from Last 3 Encounters:  07/19/23 (!) 145/93  06/30/23 132/87  06/30/23 108/78   Wt Readings from Last 3 Encounters:  07/19/23 245 lb 1.9 oz (111.2 kg)  06/30/23 247 lb (112 kg)  06/30/23 247 lb 6.4 oz (112.2 kg)      Physical Exam  No results found for any visits on 07/19/23.  Last metabolic panel Lab Results  Component Value Date   GLUCOSE 108 (H) 05/26/2023   NA 133 (L) 05/26/2023   K 3.6 05/26/2023   CL 105 05/26/2023   CO2 21 (L) 05/26/2023    BUN 10 05/26/2023   CREATININE 0.67 05/26/2023   GFRNONAA >60 05/26/2023   CALCIUM 10.7 (H) 05/26/2023   PHOS 2.0 (L) 01/12/2022   PROT 7.2 05/26/2023   ALBUMIN 3.7 05/26/2023   LABGLOB 2.7 06/03/2022   AGRATIO 1.7 06/03/2022   BILITOT 0.6 05/26/2023   ALKPHOS 74 05/26/2023   AST 16 05/26/2023   ALT 15 05/26/2023   ANIONGAP 7 05/26/2023   Last hemoglobin A1c Lab Results  Component Value Date   HGBA1C 5.6 01/21/2022   Last thyroid functions Lab Results  Component Value Date   TSH 1.900 01/21/2022      The ASCVD Risk score (Arnett DK, et al., 2019) failed to calculate for the following reasons:   The valid total cholesterol range is 130 to 320 mg/dL    Assessment & Plan:   Problem List Items Addressed This Visit   None   No follow-ups on file.    Darral Dash, FNP

## 2023-07-19 NOTE — Telephone Encounter (Signed)
 Copied from CRM [id number]. Topic: Clinical - Prescription Issue >> Jul 19, 2023  3:54 PM Antwanette L wrote: Reason for CRM: Patient was prescribed  Semaglutide-Weight Management 1.7 MG/0.75ML SOAJ, but the insurance provider will not the medicine.  The patient is requesting an alternative. The patient can be contacted through MyChart and phone at [phone]

## 2023-07-19 NOTE — Telephone Encounter (Signed)
 Let me attach Idelle Crouch who may be able to help find one in her network.

## 2023-07-19 NOTE — Telephone Encounter (Signed)
 Patient left a message that she contacted [facility] about there Hyperthyroid surgery. She was told that they are not in her insurance network. She is asking to be referred somewhere else. She is not sure who is. She would like another referral then take it from there, seeing if they are in her insurance network.

## 2023-07-20 ENCOUNTER — Other Ambulatory Visit: Payer: Self-pay

## 2023-07-20 DIAGNOSIS — Z6841 Body Mass Index (BMI) 40.0 and over, adult: Secondary | ICD-10-CM

## 2023-07-20 MED ORDER — SEMAGLUTIDE-WEIGHT MANAGEMENT 1.7 MG/0.75ML ~~LOC~~ SOAJ: 1.7000 mg | SUBCUTANEOUS | 0 refills | Status: DC

## 2023-07-20 MED ORDER — SEMAGLUTIDE-WEIGHT MANAGEMENT 1 MG/0.5ML ~~LOC~~ SOAJ: 1.0000 mg | SUBCUTANEOUS | 0 refills | Status: DC

## 2023-07-20 MED ORDER — SEMAGLUTIDE-WEIGHT MANAGEMENT 2.4 MG/0.75ML ~~LOC~~ SOAJ: 2.4000 mg | SUBCUTANEOUS | 0 refills | Status: DC

## 2023-07-20 MED ORDER — SEMAGLUTIDE-WEIGHT MANAGEMENT 0.5 MG/0.5ML ~~LOC~~ SOAJ: 0.5000 mg | SUBCUTANEOUS | 0 refills | Status: DC

## 2023-07-20 MED ORDER — SEMAGLUTIDE-WEIGHT MANAGEMENT 0.25 MG/0.5ML ~~LOC~~ SOAJ: 0.2500 mg | SUBCUTANEOUS | 0 refills | Status: DC

## 2023-07-20 NOTE — Telephone Encounter (Signed)
 Noted.

## 2023-07-20 NOTE — Telephone Encounter (Signed)
 She was seen there previously for hyperparathyroidism by Dr. Gerrit Friends but her gallbladder took precedent over the parathyroid problem.  I didn't know if they would require another referral since she was an established patient there.  Renee [name] is her primary from what I see.  I can always put in another referral if I have to.

## 2023-07-20 NOTE — Assessment & Plan Note (Signed)
 Wegovy added for weight loss efforts.  We discussed adding phentermine if insurance denies the [facility] or if it is too expensive.  I have also referred her to a nutritionist

## 2023-07-21 NOTE — Telephone Encounter (Signed)
 Patient was called and a message was left with the information provided.

## 2023-07-21 NOTE — Telephone Encounter (Signed)
 Will you call the patient and let her know what we have run into with her insurance?  And just verify that [employer] is her primary insurance?

## 2023-07-25 ENCOUNTER — Other Ambulatory Visit: Payer: Self-pay

## 2023-07-25 ENCOUNTER — Other Ambulatory Visit (HOSPITAL_COMMUNITY): Payer: Self-pay

## 2023-07-25 DIAGNOSIS — Z6841 Body Mass Index (BMI) 40.0 and over, adult: Secondary | ICD-10-CM

## 2023-07-25 MED ORDER — TRULICITY 0.75 MG/0.5ML ~~LOC~~ SOAJ
0.7500 mg | SUBCUTANEOUS | 5 refills | Status: DC
Start: 2023-07-25 — End: 2023-08-17

## 2023-07-26 ENCOUNTER — Other Ambulatory Visit (HOSPITAL_COMMUNITY): Payer: Self-pay

## 2023-07-26 ENCOUNTER — Telehealth: Payer: Self-pay | Admitting: Pharmacy Technician

## 2023-07-26 NOTE — Telephone Encounter (Signed)
 Pharmacy Patient Advocate Encounter   Received notification from CoverMyMeds that prior authorization for Trulicity 0.75MG /0.5ML auto-injectors is required/requested.   Insurance verification completed.   The patient is insured through  [employer]  .   Per test claim:  Patient must have a diagnosis of Type 2 diabetes with evidence of an A1C level of 6.5 or higher. If patient is exploring options for weight loss I ran some test claims and looks like her insurance does not cover any weight loss medications. Have patient contact her health plan for possible weight loss options.

## 2023-08-09 ENCOUNTER — Telehealth: Payer: Self-pay | Admitting: Internal Medicine

## 2023-08-09 NOTE — Telephone Encounter (Signed)
Patient was not called today.

## 2023-08-09 NOTE — Telephone Encounter (Signed)
 Copied from CRM [id number]. Topic: General - Call Back - No Documentation >> Aug 09, 2023  8:55 AM Crispin Dolphin wrote: Reason for CRM: Patient called back. States returning missed call. Message was left about Rx. No notes showing of a call today. Called [facility] - provider 1/2 day. Thank You

## 2023-08-11 NOTE — Telephone Encounter (Signed)
Left message to call back no answer.

## 2023-08-11 NOTE — Telephone Encounter (Signed)
 Pt was advised with verbal understanding

## 2023-08-17 ENCOUNTER — Telehealth: Payer: Self-pay | Admitting: Pharmacy Technician

## 2023-08-17 ENCOUNTER — Other Ambulatory Visit (HOSPITAL_COMMUNITY): Payer: Self-pay

## 2023-08-17 DIAGNOSIS — Z6841 Body Mass Index (BMI) 40.0 and over, adult: Secondary | ICD-10-CM

## 2023-08-17 MED ORDER — WEGOVY 0.25 MG/0.5ML ~~LOC~~ SOAJ: 0.2500 mg | SUBCUTANEOUS | 0 refills | Status: DC

## 2023-08-17 NOTE — Telephone Encounter (Signed)
 Looks like she has changed insurance a couple of different times. I can most likely get Wegovy  covered under her current insurance. Please advise and send new Rx for Wegovy  if appropriate.

## 2023-08-17 NOTE — Telephone Encounter (Signed)
 Ozempic /Mounjaro/Trulicity is approved exclusively as an adjunct to diet and exercise to improve glycemic control in adults with type 2 diabetes mellitus. A review of patient's medical chart reveals no documented diagnosis of type 2 diabetes or an A1C indicative of diabetes. Therefore, they do not currently meet the criteria for prior authorization of this medication. If clinically appropriate, alternative options such as Saxenda, Zepbound, or Wegovy  may be considered for this patient.

## 2023-08-17 NOTE — Telephone Encounter (Signed)
 Pharmacy Patient Advocate Encounter   Received notification from CoverMyMeds that prior authorization for Wegovy  0.25MG /0.5ML auto-injectors is required/requested.   Insurance verification completed.   The patient is insured through [employer] .   Per test claim: PA required; PA submitted to above mentioned insurance via CoverMyMeds Key/confirmation #/EOC [id number] Status is pending

## 2023-08-17 NOTE — Telephone Encounter (Signed)
 Pharmacy Patient Advocate Encounter  Received notification from [facility] that Prior Authorization for Wegovy  0.25MG /0.5ML auto-injectors has been APPROVED from 08/17/2023 to 02/17/2024. Ran test claim, Copay is $4.00. This test claim was processed through [facility]- copay amounts may vary at other pharmacies due to pharmacy/plan contracts, or as the patient moves through the different stages of their insurance plan.   PA #/Case ID/Reference #: Key: [id number]

## 2023-08-23 ENCOUNTER — Other Ambulatory Visit (HOSPITAL_COMMUNITY)
Admission: RE | Admit: 2023-08-23 | Discharge: 2023-08-23 | Disposition: A | Discharge status: Home / Self Care | Source: Ambulatory Visit | Attending: Adult Health | Admitting: Adult Health

## 2023-08-23 ENCOUNTER — Encounter: Payer: Self-pay | Admitting: Adult Health

## 2023-08-23 ENCOUNTER — Telehealth: Payer: Self-pay | Admitting: *Deleted

## 2023-08-23 ENCOUNTER — Ambulatory Visit: Admitting: Adult Health

## 2023-08-23 VITALS — BP 152/95 | HR 80 | Ht 63.0 in | Wt 251.5 lb

## 2023-08-23 DIAGNOSIS — Z1331 Encounter for screening for depression: Secondary | ICD-10-CM

## 2023-08-23 DIAGNOSIS — Z0000 Encounter for general adult medical examination without abnormal findings: Secondary | ICD-10-CM

## 2023-08-23 DIAGNOSIS — Z1231 Encounter for screening mammogram for malignant neoplasm of breast: Secondary | ICD-10-CM

## 2023-08-23 DIAGNOSIS — I10 Essential (primary) hypertension: Secondary | ICD-10-CM | POA: Diagnosis not present

## 2023-08-23 DIAGNOSIS — Z1211 Encounter for screening for malignant neoplasm of colon: Secondary | ICD-10-CM

## 2023-08-23 LAB — HEMOCCULT GUIAC POC 1CARD (OFFICE): Fecal Occult Blood, POC: NEGATIVE

## 2023-08-23 NOTE — Telephone Encounter (Signed)
 Patient was called and a message was left with Whitney's recommendation.

## 2023-08-23 NOTE — Telephone Encounter (Signed)
 Patient left a message. She states that her PCP put her on the Wegovy , she was reading about it and saw that if you have thyroid  issues to contact your physician and see if it will be okay for you to take.

## 2023-08-23 NOTE — Telephone Encounter (Signed)
 As long as she does not have personal or family history of medullary thyroid  cancer, she should be ok to take it.

## 2023-08-23 NOTE — Progress Notes (Signed)
 Patient ID: Renee [name], female   DOB: 1981/03/26, 43 y.o.   MRN: [id number] History of Present Illness: Renee [name] is a 43 year old black female,single,G2P0020, in for a well woman gyn exam and pap. She has not taken BP meds yet today.  PCP is Dr Kermit Ped   Current Medications, Allergies, Past Medical History, Past Surgical History, Family History and Social History were reviewed in [employer] electronic medical record.     Review of Systems: Patient denies any headaches, hearing loss, fatigue, blurred vision, shortness of breath, chest pain, abdominal pain, problems with bowel movements, urination, or intercourse. No  mood swings. Hs pain left knee going to have knee replacement but needs to lose 30 lbs first, started wegovy  this week  Periods last 4 days with 1-2 days kinda heavy may change pads every 2-3 hours She smokes very little may 1-2 a month but try to stop all together before knee surgery   Physical Exam:BP (!) 152/95 (BP Location: Left Arm, Patient Position: Sitting, Cuff Size: Large)   Pulse 80   Ht 5\' 3"  (1.6 m)   Wt 251 lb 8 oz (114.1 kg)   LMP 08/09/2023 (Approximate)   BMI 44.55 kg/m   General:  Well developed, well nourished, no acute distress Skin:  Warm and dry Neck:  Midline trachea, normal thyroid , good ROM, no lymphadenopathy Lungs; Clear to auscultation bilaterally Breast:  No dominant palpable mass, retraction, or nipple discharge Cardiovascular: Regular rate and rhythm Abdomen:  Soft, non tender, no hepatosplenomegaly Pelvic:  External genitalia is normal in appearance, no lesions.  The vagina is normal in appearance. Urethra has no lesions or masses. The cervix is smooth, pap with GC/CHL,trich, and HR HPV genotyping performed.  Uterus is felt to be normal size, shape, and contour.  No adnexal masses or tenderness noted.Bladder is non tender, no masses felt. Rectal: Good sphincter tone, no polyps, or hemorrhoids felt.  Hemoccult  negative. Extremities/musculoskeletal:  No swelling or varicosities noted, no clubbing or cyanosis Psych:  No mood changes, alert and cooperative,seems happy AA is 1 Fall risk is moderate    08/23/2023    9:45 AM 07/19/2023    9:37 AM 05/19/2022    1:47 PM  Depression screen PHQ 2/9  Decreased Interest 1 0 2  Down, Depressed, Hopeless 1 1 2   PHQ - 2 Score 2 1 4   Altered sleeping 3 3 3   Tired, decreased energy 3 3 3   Change in appetite 1 1 1   Feeling bad or failure about yourself  1 1 1   Trouble concentrating 0 0 2  Moving slowly or fidgety/restless 0 0 0  Suicidal thoughts 0 0 0  PHQ-9 Score 10 9 14   Difficult doing work/chores  Not difficult at all     Declines meds, said stressed over looking for a job      08/23/2023    9:45 AM 07/19/2023    9:37 AM 05/19/2022    1:47 PM 04/14/2022    9:41 AM  GAD 7 : Generalized Anxiety Score  Nervous, Anxious, on Edge 1 1 1 3   Control/stop worrying 2 1 1 3   Worry too much - different things 1 2 1 3   Trouble relaxing 1 3 1 3   Restless 0 0 0 3  Easily annoyed or irritable 2 2 1 3   Afraid - awful might happen 0 0 1 2  Total GAD 7 Score 7 9 6 20   Anxiety Difficulty  Not difficult at all  Examination chaperoned by Alphonso Aschoff LPN  Impression and Plan: 1. Routine general medical examination at a health care facility (Primary) Pap sent Pap in 3 years if normal Physical in 1 year  - Cytology - PAP( [place])  2. Essential hypertension Take BP meds and follow up with PCP  3. Encounter for screening fecal occult blood testing Hemoccult was negative  - POCT occult blood stool  4. Screening mammogram for breast cancer Last mammogram was negative 03/11/22  She will call to schedule  - MM 3D SCREENING MAMMOGRAM BILATERAL BREAST; Future

## 2023-08-25 LAB — CYTOLOGY - PAP
Adequacy: ABSENT
Chlamydia: NEGATIVE
Comment: NEGATIVE
Comment: NEGATIVE
Comment: NORMAL
Diagnosis: NEGATIVE
High risk HPV: NEGATIVE
Neisseria Gonorrhea: NEGATIVE

## 2023-08-28 ENCOUNTER — Ambulatory Visit: Payer: Self-pay | Admitting: Adult Health

## 2023-08-28 DIAGNOSIS — A599 Trichomoniasis, unspecified: Secondary | ICD-10-CM

## 2023-08-28 MED ORDER — FLUCONAZOLE 150 MG PO TABS: ORAL_TABLET | ORAL | 1 refills | Status: DC

## 2023-08-28 MED ORDER — METRONIDAZOLE 500 MG PO TABS: 500.0000 mg | ORAL_TABLET | Freq: Two times a day (BID) | ORAL | 0 refills | Status: DC

## 2023-09-01 ENCOUNTER — Encounter: Payer: Self-pay | Admitting: Gastroenterology

## 2023-09-01 ENCOUNTER — Ambulatory Visit: Payer: Self-pay | Admitting: Gastroenterology

## 2023-09-01 ENCOUNTER — Ambulatory Visit (INDEPENDENT_AMBULATORY_CARE_PROVIDER_SITE_OTHER): Admitting: Gastroenterology

## 2023-09-01 ENCOUNTER — Other Ambulatory Visit (INDEPENDENT_AMBULATORY_CARE_PROVIDER_SITE_OTHER)

## 2023-09-01 VITALS — BP 134/94 | HR 77 | Ht 63.0 in | Wt 245.2 lb

## 2023-09-01 DIAGNOSIS — K861 Other chronic pancreatitis: Secondary | ICD-10-CM | POA: Diagnosis not present

## 2023-09-01 DIAGNOSIS — K8681 Exocrine pancreatic insufficiency: Secondary | ICD-10-CM | POA: Diagnosis not present

## 2023-09-01 DIAGNOSIS — R194 Change in bowel habit: Secondary | ICD-10-CM | POA: Diagnosis not present

## 2023-09-01 DIAGNOSIS — K8689 Other specified diseases of pancreas: Secondary | ICD-10-CM

## 2023-09-01 DIAGNOSIS — R1084 Generalized abdominal pain: Secondary | ICD-10-CM | POA: Diagnosis not present

## 2023-09-01 LAB — CBC
HCT: 40.7 % (ref 36.0–46.0)
Hemoglobin: 13.3 g/dL (ref 12.0–15.0)
MCHC: 32.6 g/dL (ref 30.0–36.0)
MCV: 86 fl (ref 78.0–100.0)
Platelets: 319 10*3/uL (ref 150.0–400.0)
RBC: 4.73 Mil/uL (ref 3.87–5.11)
RDW: 13.3 % (ref 11.5–15.5)
WBC: 5.4 10*3/uL (ref 4.0–10.5)

## 2023-09-01 LAB — COMPREHENSIVE METABOLIC PANEL WITH GFR
ALT: 9 U/L (ref 0–35)
AST: 12 U/L (ref 0–37)
Albumin: 4.1 g/dL (ref 3.5–5.2)
Alkaline Phosphatase: 68 U/L (ref 39–117)
BUN: 11 mg/dL (ref 6–23)
CO2: 25 meq/L (ref 19–32)
Calcium: 11.4 mg/dL — ABNORMAL HIGH (ref 8.4–10.5)
Chloride: 108 meq/L (ref 96–112)
Creatinine, Ser: 0.71 mg/dL (ref 0.40–1.20)
GFR: 104.69 mL/min (ref >60.00)
Glucose, Bld: 100 mg/dL — ABNORMAL HIGH (ref 70–99)
Potassium: 4 meq/L (ref 3.5–5.1)
Sodium: 138 meq/L (ref 135–145)
Total Bilirubin: 0.3 mg/dL (ref 0.2–1.2)
Total Protein: 7.4 g/dL (ref 6.0–8.3)

## 2023-09-01 LAB — TSH: TSH: 1.09 u[IU]/mL (ref 0.35–5.50)

## 2023-09-01 LAB — C-REACTIVE PROTEIN: CRP: <1 mg/dL (ref 0.5–20.0)

## 2023-09-01 LAB — SEDIMENTATION RATE: Sed Rate: 19 mm/h (ref 0–20)

## 2023-09-01 LAB — LIPASE: Lipase: 14 U/L (ref 11.0–59.0)

## 2023-09-01 MED ORDER — LORAZEPAM 1 MG PO TABS
ORAL_TABLET | ORAL | 0 refills | Status: DC
Start: 2023-09-01 — End: 2023-11-30

## 2023-09-01 MED ORDER — HYOSCYAMINE SULFATE 0.125 MG SL SUBL: 0.1250 mg | SUBLINGUAL_TABLET | Freq: Two times a day (BID) | SUBLINGUAL | 1 refills | Status: DC

## 2023-09-01 NOTE — Patient Instructions (Signed)
 You have been scheduled for an MRI at [facility]  on 09/08/23. Your appointment time is 8:30 am . Please arrive to admitting (at main entrance of the hospital) 30 minutes prior to your appointment time for registration purposes. Please make certain not to have anything to eat or drink 6 hours prior to your test. In addition, if you have any metal in your body, have a pacemaker or defibrillator, please be sure to let your ordering physician know. This test typically takes 45 minutes to 1 hour to complete. Should you need to reschedule, please call [phone] to do so.   Your provider has requested that you go to the basement level for lab work before leaving today. Press "B" on the elevator. The lab is located at the first door on the left as you exit the elevator.  We have sent the following medications to your pharmacy for you to pick up at your convenience: Ativan , Levsin   _______________________________________________________  If your blood pressure at your visit was 140/90 or greater, please contact your primary care physician to follow up on this.  _______________________________________________________  If you are age 59 or older, your body mass index should be between 23-30. Your Body mass index is 43.44 kg/m. If this is out of the aforementioned range listed, please consider follow up with your Primary Care Provider.  If you are age 100 or younger, your body mass index should be between 19-25. Your Body mass index is 43.44 kg/m. If this is out of the aformentioned range listed, please consider follow up with your Primary Care Provider.   ________________________________________________________  The [facility] providers would like to encourage you to use MYCHART to communicate with providers for non-urgent requests or questions.  Due to long hold times on the telephone, sending your provider a message by [facility] may be a faster and more efficient way to get a response.  Please allow 48  business hours for a response.  Please remember that this is for non-urgent requests.  _______________________________________________________  Due to recent changes in healthcare laws, you may see the results of your imaging and laboratory studies on MyChart before your provider has had a chance to review them.  We understand that in some cases there may be results that are confusing or concerning to you. Not all laboratory results come back in the same time frame and the provider may be waiting for multiple results in order to interpret others.  Please give us  48 hours in order for your provider to thoroughly review all the results before contacting the office for clarification of your results.   Thank you for choosing me and [facility].  Dr. Brice Campi

## 2023-09-03 LAB — IGA: Immunoglobulin A: 244 mg/dL (ref 47–310)

## 2023-09-03 LAB — CALCIUM, IONIZED: Calcium, Ion: 6.3 mg/dL — ABNORMAL HIGH (ref 4.7–5.5)

## 2023-09-03 LAB — TISSUE TRANSGLUTAMINASE, IGA: (tTG) Ab, IgA: <1 U/mL

## 2023-09-04 ENCOUNTER — Encounter: Payer: Self-pay | Admitting: Gastroenterology

## 2023-09-04 ENCOUNTER — Ambulatory Visit: Admitting: Nutrition

## 2023-09-04 NOTE — Progress Notes (Unsigned)
 GASTROENTEROLOGY OUTPATIENT CLINIC VISIT   Primary Care Provider Tobi Fortes, MD [address] [place] [address] [phone]  Referring Provider Dr. Mordechai April  Patient Profile: Renee [name] is a 43 y.o. female with a pmh significant for hypertension, hypothyroidism, CRI, obesity, chronic pancreatitis (complicated by pancreatic duct stone and ductal dilation).  The patient presents to the [facility] for an evaluation and management of problem(s) noted below:  Problem List 1. Pancreatic duct dilated   2. Chronic pancreatitis, unspecified pancreatitis type (HCC)   3. Change in bowel habits   4. Generalized abdominal pain    Discussed the use of AI scribe software for clinical note transcription with the patient, who gave verbal consent to proceed.  History of Present Illness The patient returns for follow-up.  She has a history of chronic pancreatitis and a pancreatic duct stone.  We are asked by the [place] [facility] team to evaluate her abdominal pain and changes in bowel habits, if they are occurring as a result of her known pancreatic ductal stone.   She has been experiencing significant abdominal pain, particularly postprandial, over the past three to four months. The pain is often accompanied by diarrhea or loose stools and is exacerbated by the consumption of greasy or fatty foods.  Some of these symptoms are reminiscent of her gallbladder symptoms (though she is post-cholecystectomy).  She has noted no blood in her stools.  She will have pain/discomfort every day though some meals she will not experience this.  She has not had another true bout of pancreatitis and does not feel these episodes are like her pancreatitis attack.  No recent stool tests have been conducted to evaluate stool infections or for EPI.  She recently started Wegovy , with her second dose administered this week, thus her symptoms predated starting this medication.  Her  weight has been relatively stable over the past year, with a recent high of 251 pounds, up from 240 pounds a year ago.   GI Review of Systems Positive as above Negative for dysphagia, odynophagia, melena, hematochezia  Review of Systems General: Denies fevers/chills/weight loss unintentionally Cardiovascular: Denies chest pain Pulmonary: Denies shortness of breath Gastroenterological: See HPI Genitourinary: Denies darkened urine Endocrine: Denies temperature intolerance Dermatological: Denies jaundice Psychological: Mood is stable  Medications Current Outpatient Medications  Medication Sig Dispense Refill   amLODipine  (NORVASC ) 10 MG tablet Take 1 tablet (10 mg total) by mouth daily. 90 tablet 3   carvedilol  (COREG ) 12.5 MG tablet TAKE 1 TABLET (12.5MG  TOTAL) BY MOUTH TWICE A DAY WITH MEALS 180 tablet 1   hyoscyamine (LEVSIN/SL) 0.125 MG SL tablet Place 1 tablet (0.125 mg total) under the tongue 2 (two) times daily. 30 tablet 1   LORazepam (ATIVAN) 1 MG tablet Take 1 tablet by mouth 3-6 hours prior to MRI. Take second tablet by mouth 1 hour before MRI. 2 tablet 0   losartan  (COZAAR ) 100 MG tablet Take 100 mg by mouth daily.     naproxen  (NAPROSYN ) 375 MG tablet Take 375 mg by mouth 2 (two) times daily with a meal. prn     Semaglutide -Weight Management (WEGOVY ) 0.25 MG/0.5ML SOAJ Inject 0.25 mg into the skin once a week. 2 mL 0   No current facility-administered medications for this visit.    Allergies No Known Allergies  Histories Past Medical History:  Diagnosis Date   Elevated serum creatinine 02/19/2021   Fibroids    Herpes simplex virus (HSV) infection    no  OB +HSV 05/04/16 at [facility]    Hypertension    Hypothyroidism    Pancreatitis    Vaginal Pap smear, abnormal    Past Surgical History:  Procedure Laterality Date   BIOPSY  03/31/2022   Procedure: BIOPSY;  Surgeon: Brice Campi Albino Alu., MD;  Location: [facility];  Service: Gastroenterology;;    CHOLECYSTECTOMY N/A 04/13/2020   Procedure: LAPAROSCOPIC CHOLECYSTECTOMY WITH INTRAOPERATIVE CHOLANGIOGRAM;  Surgeon: Oralee Billow, MD;  Location: [facility];  Service: General;  Laterality: N/A;   ESOPHAGOGASTRODUODENOSCOPY (EGD) WITH PROPOFOL  N/A 03/31/2022   Procedure: ESOPHAGOGASTRODUODENOSCOPY (EGD) WITH PROPOFOL ;  Surgeon: Normie Becton., MD;  Location: [facility];  Service: Gastroenterology;  Laterality: N/A;   EUS N/A 03/31/2022   Procedure: UPPER ENDOSCOPIC ULTRASOUND (EUS) LINEAR;  Surgeon: Normie Becton., MD;  Location: [facility];  Service: Gastroenterology;  Laterality: N/A;   KNEE SURGERY     TONSILLECTOMY     Social History   Socioeconomic History   Marital status: Single    Spouse name: Not on file   Number of children: Not on file   Years of education: Not on file   Highest education level: 12th grade  Occupational History   Not on file  Tobacco Use   Smoking status: Some Days    Current packs/day: 0.00    Average packs/day: 0.3 packs/day for 17.0 years (4.3 ttl pk-yrs)    Types: Cigarettes    Start date: 01/09/2005    Last attempt to quit: 01/09/2022    Years since quitting: 1.6   Smokeless tobacco: Never   Tobacco comments:    smokes 2-3 cig daily  Vaping Use   Vaping status: Never Used  Substance and Sexual Activity   Alcohol use: Yes    Comment: socially   Drug use: Not Currently    Types: Marijuana    Comment: occasionally   Sexual activity: Yes    Birth control/protection: None, Other-see comments    Comment: pull out  Other Topics Concern   Not on file  Social History Narrative   Not on file   Social Drivers of Health   Financial Resource Strain: Medium Risk (08/23/2023)   Overall Financial Resource Strain (CARDIA)    Difficulty of Paying Living Expenses: Somewhat hard  Food Insecurity: Food Insecurity Present (08/23/2023)   Hunger Vital Sign    Worried About Running Out of Food in the Last Year: Sometimes true    Ran Out of  Food in the Last Year: Often true  Transportation Needs: No Transportation Needs (08/23/2023)   PRAPARE - Administrator, Civil Service (Medical): No    Lack of Transportation (Non-Medical): No  Physical Activity: Inactive (08/23/2023)   Exercise Vital Sign    Days of Exercise per Week: 0 days    Minutes of Exercise per Session: 20 min  Stress: No Stress Concern Present (08/23/2023)   [employer] of Occupational Health - Occupational Stress Questionnaire    Feeling of Stress : Only a little  Social Connections: Moderately Isolated (08/23/2023)   Social Connection and Isolation Panel [NHANES]    Frequency of Communication with Friends and Family: Twice a week    Frequency of Social Gatherings with Friends and Family: Once a week    Attends Religious Services: 1 to 4 times per year    Active Member of [employer] or Organizations: No    Attends Banker Meetings: Never    Marital Status: Divorced  Catering manager Violence: Not At Risk (08/23/2023)  Humiliation, Afraid, Rape, and Kick questionnaire    Fear of Current or Ex-Partner: No    Emotionally Abused: No    Physically Abused: No    Sexually Abused: No   Family History  Problem Relation Age of Onset   Congestive Heart Failure Maternal Grandmother    Hypertension Maternal Grandmother    Colon polyps Maternal Grandmother    Kidney disease Maternal Grandmother    Other Maternal Grandfather        aneursym   Kidney disease Maternal Grandfather        on dialysis   Congestive Heart Failure Maternal Grandfather    Colon polyps Maternal Grandfather    Diabetes Neg Hx    Pancreatic cancer Neg Hx    Stomach cancer Neg Hx    Colon cancer Neg Hx    Esophageal cancer Neg Hx    Inflammatory bowel disease Neg Hx    Liver disease Neg Hx    Rectal cancer Neg Hx    I have reviewed her medical, social, and family history in detail and updated the electronic medical record as necessary.    PHYSICAL EXAMINATION   BP (!) 134/94   Pulse 77   Ht 5\' 3"  (1.6 m)   Wt 245 lb 4 oz (111.2 kg)   LMP 08/09/2023 (Approximate)   SpO2 99%   BMI 43.44 kg/m  Wt Readings from Last 3 Encounters:  09/01/23 245 lb 4 oz (111.2 kg)  08/23/23 251 lb 8 oz (114.1 kg)  07/19/23 245 lb 1.9 oz (111.2 kg)  GEN: NAD, appears stated age, doesn't appear chronically ill PSYCH: Cooperative, without pressured speech EYE: Conjunctivae pink, sclerae anicteric ENT: MMM CV: Nontachycardic RESP: No audible wheezing GI: NABS, soft, protuberant abdomen, without rebound or guarding MSK/EXT: Bilateral pedal edema present SKIN: No jaundice NEURO:  Alert & Oriented x 3, no focal deficits   REVIEW OF DATA  I reviewed the following data at the time of this encounter:  GI Procedures and Studies  2023 upper EUS EGD impression: - No gross lesions in the majority of the esophagus. - LA Grade A esophagitis with no bleeding found distally and at GE junction. - Z-line irregular, 36 cm from the incisors. - Gastritis. Biopsied. - Duodenitis. Biopsied. - Normal major papilla. EUS impression: - The pancreatic duct within the body and tail regions had a dilated endosonographic appearance, had intraductal stone, had a prominently branched endosonographic appearance, had a tortuous/ectatic appearance and had hyperechoic walls. The pancreas duct within the head and neck are normal in appearance and size. - Pancreatic parenchymal abnormalities consisting of lobularity and hyperechoic strands were noted in the pancreatic head, genu of the pancreas and uncinate process of the pancreas. There was atrophy in the upstream pancreatic body/tail area from the aforementioned stone. - There was no sign of significant pathology in the common bile duct and in the common hepatic duct. - One enlarged lymph node was visualized in the porta hepatis region. Tissue has not been obtained. However, the endosonographic appearance is consistent with benign inflammatory  changes.  Laboratory Studies  Reviewed those in epic  Imaging Studies  January 2024 CTAP with contrast IMPRESSION: 1. No acute findings are noted in the abdomen or pelvis to account for the patient's symptoms. 2. Large calcification in the body of the pancreas with evidence of chronic proximal ductal obstruction and chronic atrophy throughout the body and tail of the pancreas, similar to prior studies, likely sequela of chronic pancreatitis. 3. Nonobstructive calculi in  the right renal collecting system measuring up to 4 mm in the interpolar region. 4. New small right ovarian lesion, likely a hemorrhagic cyst. Follow-up pelvic ultrasound is recommended in 3-6 months to ensure the regression of this finding.   ASSESSMENT  Ms. Zweig is a 43 y.o. female with a pmh significant for hypertension, hypothyroidism, CRI, obesity, chronic pancreatitis (complicated by pancreatic duct stone and ductal dilation).  The patient is seen today for evaluation and management of:  1. Pancreatic duct dilated   2. Chronic pancreatitis, unspecified pancreatitis type (HCC)   3. Change in bowel habits   4. Generalized abdominal pain    The patient is hemodynamically stable.  Clinically, she has been experiencing multiple GI symptoms.  She is thankfully not had another bout of pancreatitis.  It is not clear to me that her symptomatology that she is experiencing is truly related to her pancreatic ductal stone that was noted back in 2023.  However no updated imaging has been performed for nearly 1-1/2 years.  The indications for pancreatic ductal stone extraction or pancreatic ductal stenting attempt would be recurrent bouts of pancreatitis, progressive pancreatic ductal dilation, signs of progressive exocrine pancreas insufficiency, or obvious new finding of malignancy or solid lesions.  We are going to move forward with a MRI/MRCP first.  Depending on that could consider the role of EUS.  Could also consider the  role of pancreatic ERCP depending on these findings.  I think that some of her symptoms sound more likely to be irritable bowel, and we will initiate Levsin for her.  She will follow-up with her primary GI team for further workup of her abdominal pain and discomfort otherwise though we will be initiating some workup for her in regards to laboratories and stool studies.  More information will be gathered for us  to determine next steps of her evaluation.  All patient questions were answered to the best of my ability, and the patient agrees to the aforementioned plan of action with follow-up as indicated.   PLAN  Laboratories as outlined below Stool studies as outlined below MRI/MRCP to be obtained Consideration of repeat EUS and/or pancreatic ERCP depending on findings of MRI/MRCP - Ativan  to be administered to patient for her imaging study Levsin 2-3 times daily as needed abdominal pain/cramping Follow-up with primary gastroenterology team for further workup of her symptomatology   Orders Placed This Encounter  Procedures   Stool Culture   Ova and parasite examination   Clostridium difficile Toxin B, Qualitative, Real-Time PCR   CALPROTECTIN   MR ABDOMEN MRCP W WO CONTAST   CBC   Comp Met (CMET)   Calcium, ionized   Lipase   TSH   Sedimentation rate   C-reactive protein   IgA   Tissue transglutaminase, IgA   Pancreatic Elastase, Fecal   Fecal fat, qualitative    New Prescriptions   HYOSCYAMINE  (LEVSIN/SL) 0.125 MG SL TABLET    Place 1 tablet (0.125 mg total) under the tongue 2 (two) times daily.   LORAZEPAM  (ATIVAN ) 1 MG TABLET    Take 1 tablet by mouth 3-6 hours prior to MRI. Take second tablet by mouth 1 hour before MRI.   Modified Medications   No medications on file    Planned Follow Up No follow-ups on file.   Total Time in Face-to-Face and in Coordination of Care for patient including independent/personal interpretation/review of prior testing, medical history,  examination, medication adjustment, communicating results with the patient directly, and documentation within the EHR  is 25 minutes.Yong Henle, MD [facility]oscopy Office # [phone]

## 2023-09-05 ENCOUNTER — Encounter: Payer: Self-pay | Admitting: Gastroenterology

## 2023-09-06 ENCOUNTER — Other Ambulatory Visit

## 2023-09-06 DIAGNOSIS — K861 Other chronic pancreatitis: Secondary | ICD-10-CM

## 2023-09-06 DIAGNOSIS — R194 Change in bowel habit: Secondary | ICD-10-CM

## 2023-09-06 DIAGNOSIS — K8689 Other specified diseases of pancreas: Secondary | ICD-10-CM

## 2023-09-06 DIAGNOSIS — R1084 Generalized abdominal pain: Secondary | ICD-10-CM

## 2023-09-07 ENCOUNTER — Telehealth: Payer: Self-pay | Admitting: *Deleted

## 2023-09-07 NOTE — Telephone Encounter (Signed)
 Patient left a message on 09/06/23 stating that she was to have surgery at [facility]. She shared that they needed a referral. She mentioned that this surgery is for removal of a cyst. A referral has been sent by [facility] for the patient to see Dr.Gerkin for evaluation of her Parathyroid .  Whitney shared that if she needed to send another one she would.. I returned the call to the patient, the person that answered the phone said that she was not available. The patient's phone is currently turned off. They said that they would try and find her and have her call our office back.

## 2023-09-08 ENCOUNTER — Other Ambulatory Visit: Payer: Self-pay | Admitting: Gastroenterology

## 2023-09-08 ENCOUNTER — Ambulatory Visit (HOSPITAL_COMMUNITY)
Admission: RE | Admit: 2023-09-08 | Discharge: 2023-09-08 | Disposition: A | Discharge status: Home / Self Care | Source: Ambulatory Visit | Attending: Gastroenterology | Admitting: Gastroenterology

## 2023-09-08 DIAGNOSIS — K8689 Other specified diseases of pancreas: Secondary | ICD-10-CM

## 2023-09-08 DIAGNOSIS — R1084 Generalized abdominal pain: Secondary | ICD-10-CM

## 2023-09-08 DIAGNOSIS — R194 Change in bowel habit: Secondary | ICD-10-CM

## 2023-09-08 DIAGNOSIS — K861 Other chronic pancreatitis: Secondary | ICD-10-CM | POA: Diagnosis present

## 2023-09-08 LAB — FECAL FAT, QUALITATIVE
Fat Qual Neutral, Stl: NORMAL
Fat Qual Total, Stl: NORMAL

## 2023-09-08 MED ORDER — GADOBUTROL 1 MMOL/ML IV SOLN
10.0000 mL | Freq: Once | INTRAVENOUS | Status: AC | PRN
  Administered 2023-09-08: 10 mL via INTRAVENOUS

## 2023-09-13 ENCOUNTER — Ambulatory Visit: Payer: Self-pay | Admitting: Gastroenterology

## 2023-09-14 ENCOUNTER — Ambulatory Visit: Admitting: Nutrition

## 2023-09-14 LAB — OVA AND PARASITE EXAMINATION
CONCENTRATE RESULT:: NONE SEEN
MICRO NUMBER:: 16508562
SPECIMEN QUALITY:: ADEQUATE
TRICHROME RESULT:: NONE SEEN

## 2023-09-14 LAB — CLOSTRIDIUM DIFFICILE TOXIN B, QUALITATIVE, REAL-TIME PCR: Toxigenic C. Difficile by PCR: NOT DETECTED

## 2023-09-14 LAB — PANCREATIC ELASTASE, FECAL: Pancreatic Elastase-1, Stool: 208 ug/g (ref >200)

## 2023-09-14 LAB — CALPROTECTIN: Calprotectin: 14 ug/g

## 2023-09-14 NOTE — Telephone Encounter (Signed)
 TC due to no show. "Phone call can't be made at this time" message. No VM .

## 2023-09-18 ENCOUNTER — Other Ambulatory Visit (HOSPITAL_COMMUNITY): Payer: Self-pay

## 2023-11-01 ENCOUNTER — Telehealth: Payer: Self-pay | Admitting: *Deleted

## 2023-11-01 DIAGNOSIS — E213 Hyperparathyroidism, unspecified: Secondary | ICD-10-CM

## 2023-11-01 NOTE — Telephone Encounter (Signed)
 Patient left a message that she had called [facility], and she was told that they did not have a referral for her to have thyroid  surgery. She is asking that one be sent over , please.

## 2023-11-01 NOTE — Telephone Encounter (Signed)
 I put it in

## 2023-11-02 NOTE — Telephone Encounter (Signed)
 Patient was called and made aware.

## 2023-11-06 ENCOUNTER — Telehealth: Payer: Self-pay

## 2023-11-06 NOTE — Telephone Encounter (Signed)
 Please see my chart message 09/21/23. Patient called office. I returned call to patient at number listed. Had to leave message.

## 2023-11-06 NOTE — Telephone Encounter (Signed)
 Patient called stated that [facility] advised her that our office has been trying to get into contact with her regarding MRI results. Patient states best phone number is [phone]. Please advise, thank you

## 2023-11-07 NOTE — Telephone Encounter (Signed)
 Spoke with patient today. She would like to schedule follow-up appointment to discuss further with MD. Appt made for 12/20/23 at 10:30 am with Dr. Wilhelmenia.

## 2023-11-08 ENCOUNTER — Ambulatory Visit: Payer: Self-pay | Admitting: Surgery

## 2023-11-10 ENCOUNTER — Ambulatory Visit (HOSPITAL_COMMUNITY)
Admission: RE | Admit: 2023-11-10 | Discharge: 2023-11-10 | Disposition: A | Discharge status: Home / Self Care | Source: Ambulatory Visit | Attending: Nurse Practitioner | Admitting: Nurse Practitioner

## 2023-11-10 ENCOUNTER — Ambulatory Visit
Admission: EM | Admit: 2023-11-10 | Discharge: 2023-11-10 | Disposition: A | Discharge status: Home / Self Care | Attending: Nurse Practitioner | Admitting: Nurse Practitioner

## 2023-11-10 ENCOUNTER — Telehealth: Payer: Self-pay | Admitting: Nurse Practitioner

## 2023-11-10 DIAGNOSIS — M25561 Pain in right knee: Secondary | ICD-10-CM

## 2023-11-10 DIAGNOSIS — M25461 Effusion, right knee: Secondary | ICD-10-CM | POA: Insufficient documentation

## 2023-11-10 DIAGNOSIS — M1711 Unilateral primary osteoarthritis, right knee: Secondary | ICD-10-CM | POA: Insufficient documentation

## 2023-11-10 DIAGNOSIS — G8929 Other chronic pain: Secondary | ICD-10-CM

## 2023-11-10 MED ORDER — KETOROLAC TROMETHAMINE 30 MG/ML IJ SOLN
30.0000 mg | Freq: Once | INTRAMUSCULAR | Status: AC
  Administered 2023-11-10: 30 mg via INTRAMUSCULAR

## 2023-11-10 MED ORDER — MELOXICAM 7.5 MG PO TABS: 7.5000 mg | ORAL_TABLET | Freq: Every day | ORAL | 0 refills | Status: DC

## 2023-11-10 NOTE — ED Triage Notes (Signed)
 Pt reports she has some right knee pain x 1 day. Denies injury. States did a  lot of walking recently    Applied ice but no relief

## 2023-11-10 NOTE — Telephone Encounter (Signed)
 Call patient to discuss x-ray results of the right knee.  Spoke with patient, verified patient with 2 patient identifiers.  Patient was advised that the x-ray does show a small joint effusion along with tricompartmental osteoarthritis.  Patient advised that we will proceed with continued treatment recommendations at this time, recommend that Renee [name] does follow-up with orthopedics for further evaluation.  Patient was in agreement with this plan of care and verbalized understanding.  All questions were answered.

## 2023-11-10 NOTE — Discharge Instructions (Addendum)
 Please go to [facility] for an x-ray of your right knee.  You will need to go to the radiology department for your x-ray.  You will be contacted when the results of the x-ray are received.  You also have access to your results via MyChart. You were given an injection of Toradol  30 mg.  Do not take any additional NSAIDs today to include ibuprofen , Advil , Aleve , or naproxen .  You may take Tylenol  for breakthrough pain or discomfort. Wear the knee brace when you are engaged in prolonged or strenuous activity. RICE therapy, rest, ice, compression, and elevation.  Apply ice for 20 minutes, remove for 1 hour, repeat as needed. As discussed, it is recommended that you follow-up with orthopedics for further evaluation, recommend follow-up within the next 7 to 10 days. Follow-up as needed.

## 2023-11-10 NOTE — ED Provider Notes (Signed)
 [facility]    CSN: [id number] Arrival date & time: 11/10/23  9063      History   Chief Complaint No chief complaint on file.   HPI Renee [name] is a 43 y.o. female.   The history is provided by the patient.   Patient presents for complaints of right knee pain that started over the past 24 hours.  Per review of the chart, patient with history of chronic right knee pain, she was seen by Dr. Brenna in 2023 for the same or similar symptoms.  She reports that the knee does feel weak and has been swelling.  States that she has been walking more over the past several days.  She denies injury, trauma, bruising, or radiation of pain.  Patient reports prior history of left knee pain with knee surgery, states that she does favor the right knee since she has had difficulty walking on the left knee.  Patient states she has been taking over-the-counter analgesics with minimal relief of her symptoms.  Also states she has been applying ice.  Past Medical History:  Diagnosis Date   Elevated serum creatinine 02/19/2021   Fibroids    Herpes simplex virus (HSV) infection    no OB +HSV 05/04/16 at [facility]    Hypertension    Hypothyroidism    Pancreatitis    Vaginal Pap smear, abnormal     Patient Active Problem List   Diagnosis Date Noted   Trichomoniasis 08/28/2023   Routine general medical examination at a health care facility 08/23/2023   BMI 40.0-44.9, adult (HCC) 07/20/2023   Abdominal pain, epigastric 06/20/2023   Loose stools 06/20/2023   Chronic pancreatitis (HCC) 06/20/2023   Loud snoring 04/28/2022   Anxiety and depression 04/14/2022   Constipation 02/08/2022   History of vitamin D  deficiency 01/26/2022   Risk factors for obstructive sleep apnea 01/26/2022   Positive screening for depression on 9-item Patient Health Questionnaire (PHQ-9) 01/26/2022   Encounter to establish care 01/26/2022   Elevated LFTs    Essential hypertension 01/12/2022   UTI (urinary tract  infection) 01/12/2022   Pancreatic duct dilated    Acute on chronic pancreatitis (HCC) 01/11/2022   Hypercalcemia 02/19/2021   Elevated serum creatinine 02/19/2021   Screening mammogram for breast cancer 02/16/2021   Encounter for screening fecal occult blood testing 02/16/2021   Encounter for gynecological examination with Papanicolaou smear of cervix 02/16/2021   Cholelithiasis with chronic cholecystitis 04/13/2020   Cholelithiasis with cholecystitis 04/02/2020   Hyperparathyroidism, primary (HCC) 04/02/2020   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/2015    Past Surgical History:  Procedure Laterality Date   BIOPSY  03/31/2022   Procedure: BIOPSY;  Surgeon: Wilhelmenia Aloha Raddle., MD;  Location: [facility];  Service: Gastroenterology;;   CHOLECYSTECTOMY N/A 04/13/2020   Procedure: LAPAROSCOPIC CHOLECYSTECTOMY WITH INTRAOPERATIVE CHOLANGIOGRAM;  Surgeon: Eletha Boas, MD;  Location: [facility];  Service: General;  Laterality: N/A;   ESOPHAGOGASTRODUODENOSCOPY (EGD) WITH PROPOFOL  N/A 03/31/2022   Procedure: ESOPHAGOGASTRODUODENOSCOPY (EGD) WITH PROPOFOL ;  Surgeon: Wilhelmenia Aloha Raddle., MD;  Location: [facility];  Service: Gastroenterology;  Laterality: N/A;   EUS N/A 03/31/2022   Procedure: UPPER ENDOSCOPIC ULTRASOUND (EUS) LINEAR;  Surgeon: Wilhelmenia Aloha Raddle., MD;  Location: [facility];  Service: Gastroenterology;  Laterality: N/A;   KNEE SURGERY     TONSILLECTOMY      OB History     Gravida  2   Para      Term      Preterm  AB  2   Living         SAB      IAB      Ectopic      Multiple      Live Births               Home Medications    Prior to Admission medications   Medication Sig Start Date End Date Taking? Authorizing Provider  amLODipine  (NORVASC ) 10 MG tablet Take 1 tablet (10 mg total) by mouth daily. 03/17/23   Melvenia Manus BRAVO, MD  carvedilol  (COREG ) 12.5 MG tablet TAKE 1 TABLET (12.5MG  TOTAL) BY MOUTH TWICE A DAY WITH MEALS 03/20/23   Melvenia Manus BRAVO, MD  hyoscyamine  (LEVSIN AMIEL) 0.125 MG SL tablet Place 1 tablet (0.125 mg total) under the tongue 2 (two) times daily. 09/01/23   [name], Aloha Raddle., MD  LORazepam  (ATIVAN ) 1 MG tablet Take 1 tablet by mouth 3-6 hours prior to MRI. Take second tablet by mouth 1 hour before MRI. 09/01/23   [name], Aloha Raddle., MD  losartan  (COZAAR ) 100 MG tablet Take 100 mg by mouth daily.    [provider]  naproxen  (NAPROSYN ) 375 MG tablet Take 375 mg by mouth 2 (two) times daily with a meal. prn    [provider]  Semaglutide -Weight Management (WEGOVY ) 0.25 MG/0.5ML SOAJ Inject 0.25 mg into the skin once a week. 08/17/23   Melvenia Manus BRAVO, MD    Family History Family History  Problem Relation Age of Onset   Congestive Heart Failure Maternal Grandmother    Hypertension Maternal Grandmother    Colon polyps Maternal Grandmother    Kidney disease Maternal Grandmother    Other Maternal Grandfather        aneursym   Kidney disease Maternal Grandfather        on dialysis   Congestive Heart Failure Maternal Grandfather    Colon polyps Maternal Grandfather    Diabetes Neg Hx    Pancreatic cancer Neg Hx    Stomach cancer Neg Hx    Colon cancer Neg Hx    Esophageal cancer Neg Hx    Inflammatory bowel disease Neg Hx    Liver disease Neg Hx    Rectal cancer Neg Hx     Social History Social History   Tobacco Use   Smoking status: Some Days    Current packs/day: 0.00    Average packs/day: 0.3 packs/day for 17.0 years (4.3 ttl pk-yrs)    Types: Cigarettes    Start date: 01/09/2005    Last attempt to quit: 01/09/2022    Years since quitting: 1.8   Smokeless tobacco: Never   Tobacco comments:    smokes 2-3 cig daily  Vaping Use   Vaping status: Never Used  Substance Use Topics   Alcohol use: Yes    Comment: socially   Drug use: Not Currently    Types: Marijuana    Comment: occasionally     Allergies   Patient has no known allergies.   Review of  Systems Review of Systems Per HPI  Physical Exam Triage Vital Signs ED Triage Vitals  Encounter Vitals Group     BP 11/10/23 1007 (!) 142/93     Girls Systolic BP Percentile --      Girls Diastolic BP Percentile --      Boys Systolic BP Percentile --      Boys Diastolic BP Percentile --      Pulse Rate 11/10/23 1007 85  Resp 11/10/23 1007 18     Temp 11/10/23 1007 99.2 F (37.3 C)     Temp Source 11/10/23 1007 Oral     SpO2 11/10/23 1007 98 %     Weight --      Height --      Head Circumference --      Peak Flow --      Pain Score 11/10/23 1004 9     Pain Loc --      Pain Education --      Exclude from Growth Chart --    No data found.  Updated Vital Signs BP (!) 142/93 (BP Location: Right Arm)   Pulse 85   Temp 99.2 F (37.3 C) (Oral)   Resp 18   LMP 10/12/2023   SpO2 98%   Visual Acuity Right Eye Distance:   Left Eye Distance:   Bilateral Distance:    Right Eye Near:   Left Eye Near:    Bilateral Near:     Physical Exam Vitals and nursing note reviewed.  Constitutional:      General: She is not in acute distress.    Appearance: Normal appearance.  HENT:     Head: Normocephalic.  Eyes:     Extraocular Movements: Extraocular movements intact.     Pupils: Pupils are equal, round, and reactive to light.  Pulmonary:     Effort: Pulmonary effort is normal.  Musculoskeletal:     Cervical back: Normal range of motion.     Right knee: Swelling and effusion present. Decreased range of motion. Tenderness present over the lateral joint line and LCL. Normal pulse.  Skin:    General: Skin is warm and dry.  Neurological:     General: No focal deficit present.     Mental Status: She is alert and oriented to person, place, and time.  Psychiatric:        Mood and Affect: Mood normal.        Behavior: Behavior normal.      UC Treatments / Results  Labs (all labs ordered are listed, but only abnormal results are displayed) Labs Reviewed - No data to  display  EKG   Radiology No results found.  Procedures Procedures (including critical care time)  Medications Ordered in UC Medications  ketorolac  (TORADOL ) 30 MG/ML injection 30 mg (has no administration in time range)    Initial Impression / Assessment and Plan / UC Course  I have reviewed the triage vital signs and the nursing notes.  Pertinent labs & imaging results that were available during my care of the patient were reviewed by me and considered in my medical decision making (see chart for details).  X-ray of the right knee is pending.  There does appear to be an effusion of the right knee on exam.  Per review of previous imaging performed in 2023, x-ray did show degenerative changes only at that time.  Hinged knee brace was applied to provide additional compression and support.  Toradol  30 mg IM administered for pain.  Will start patient on meloxicam 7.5 mg daily for knee pain and for inflammation.  Supportive care recommendations were provided discussed with the patient to include RICE therapy.  Patient was advised it is recommended that she follow-up with orthopedics for further evaluation.  Patient was in agreement with this plan of care and verbalizes understanding.  All questions were answered.  Patient stable for discharge.   Final Clinical Impressions(s) / UC Diagnoses   Final  diagnoses:  None   Discharge Instructions   None    ED Prescriptions   None    PDMP not reviewed this encounter.   Gilmer Etta PARAS, NP 11/10/23 1030

## 2023-11-13 ENCOUNTER — Ambulatory Visit (HOSPITAL_COMMUNITY): Payer: Self-pay

## 2023-11-16 ENCOUNTER — Ambulatory Visit: Admitting: Orthopedic Surgery

## 2023-11-16 DIAGNOSIS — M1712 Unilateral primary osteoarthritis, left knee: Secondary | ICD-10-CM

## 2023-11-16 NOTE — Progress Notes (Signed)
   LMP 10/12/2023   There is no height or weight on file to calculate BMI.  Chief Complaint  Patient presents with   Knee Pain    L    Encounter Diagnosis  Name Primary?   Unilateral primary osteoarthritis, left knee Yes     Improved

## 2023-11-16 NOTE — Progress Notes (Signed)
  Follow-up visit Chief Complaint  Patient presents with   Knee Pain    L    Encounter Diagnosis  Name Primary?   Unilateral primary osteoarthritis, left knee Yes     43 year old female with end-stage arthritis of the left knee.  She had an x-ray and an MRI we considered surgery but because of her weight and age I recommended she see someone at [facility] for further management.  However, she came back here says that her knee is better so at this point I told her to lose weight and use Tylenol  for pain and return when and if any problems.  Currently in stable condition but high risk for recurrence

## 2023-11-24 ENCOUNTER — Encounter: Payer: Self-pay | Admitting: Internal Medicine

## 2023-11-28 NOTE — Progress Notes (Signed)
 COVID Vaccine Completed:  Date of COVID positive in last 90 days:  PCP - Dr. Melvenia- changed practices, pt no longer sees Cardiologist - n/a Gastro- Dr. Wilhelmenia  Chest x-ray - 05/26/23 Epic EKG - 05/29/23 Epic Stress Test - N/A ECHO - N/A Cardiac Cath - n/a Pacemaker/ICD device last checked:N/A Spinal Cord Stimulator:N/A  Bowel Prep - N/A  Sleep Study - yes CPAP - no, mild case, does not need to use  Fasting Blood Sugar - N/A Checks Blood Sugar _____ times a day  Last dose of GLP1 agonist- Wegovy , takes Tuesdays  GLP1 instructions:  Do not take after  12/07/23   Last dose of SGLT-2 inhibitors-  N/A SGLT-2 instructions:  Do not take after     Blood Thinner Instructions: N/A Last dose:   Time: Aspirin  Instructions:N/A Last Dose:  Activity level: Can go up a flight of stairs and perform activities of daily living without stopping and without symptoms of chest pain or shortness of breath.  Anesthesia review: HTN, chronic pancreatitis, BP 150/112 and 159/116. Patient reports she has not taken BP medications in 1 month due to symptoms of upset stomach. Per anesthesia pt should get back on her meds and check BP at home. If still having symptoms from meds or BP not improving she needs to call her doctors office. Patient verbalized understanding.   Patient denies shortness of breath, fever, cough and chest pain at PAT appointment  Patient verbalized understanding of instructions that were given to them at the PAT appointment. Patient was also instructed that they will need to review over the PAT instructions again at home before surgery.

## 2023-11-28 NOTE — Patient Instructions (Signed)
 SURGICAL WAITING ROOM VISITATION  Patients having surgery or a procedure may have no more than 2 support people in the waiting area - these visitors may rotate.    Children under the age of 79 must have an adult with them who is not the patient.  Visitors with respiratory illnesses are discouraged from visiting and should remain at home.  If the patient needs to stay at the hospital during part of their recovery, the visitor guidelines for inpatient rooms apply. Pre-op nurse will coordinate an appropriate time for 1 support person to accompany patient in pre-op.  This support person may not rotate.    Please refer to the [facility] website for the visitor guidelines for Inpatients (after your surgery is over and you are in a regular room).    Your procedure is scheduled on: 12/15/23   Report to [facility] Main Entrance    Report to admitting at 5:15 AM   Call this number if you have problems the morning of surgery [phone]   Do not eat food :After Midnight.   After Midnight you may have the following liquids until 4:30 AM DAY OF SURGERY  Water Non-Citrus Juices (without pulp, NO RED-Apple, White grape, White cranberry) Black Coffee (NO MILK/CREAM OR CREAMERS, sugar ok)  Clear Tea (NO MILK/CREAM OR CREAMERS, sugar ok) regular and decaf                             Plain Jell-O (NO RED)                                           Fruit ices (not with fruit pulp, NO RED)                                     Popsicles (NO RED)                                                               Sports drinks like Gatorade (NO RED)                      If you have questions, please contact your surgeon's office.   FOLLOW BOWEL PREP AND ANY ADDITIONAL PRE OP INSTRUCTIONS YOU RECEIVED FROM YOUR SURGEON'S OFFICE!!!     Oral Hygiene is also important to reduce your risk of infection.                                    Remember - BRUSH YOUR TEETH THE MORNING OF SURGERY WITH YOUR  REGULAR TOOTHPASTE  DENTURES WILL BE REMOVED PRIOR TO SURGERY PLEASE DO NOT APPLY Poly grip OR ADHESIVES!!!   Do NOT smoke after Midnight   Stop all vitamins and herbal supplements 7 days before surgery.   Take these medicines the morning of surgery with A SIP OF WATER: Amlodipine , Carvedilol    DO not take Wegovy  for 7 days prior to surgery. Do not take after 12/07/23  You may not have any metal on your body including hair pins, jewelry, and body piercing             Do not wear make-up, lotions, powders, perfumes, or deodorant  Do not wear nail polish including gel and S&S, artificial/acrylic nails, or any other type of covering on natural nails including finger and toenails. If you have artificial nails, gel coating, etc. that needs to be removed by a nail salon please have this removed prior to surgery or surgery may need to be canceled/ delayed if the surgeon/ anesthesia feels like they are unable to be safely monitored.   Do not shave  48 hours prior to surgery.    Do not bring valuables to the hospital. [facility] IS NOT             RESPONSIBLE   FOR VALUABLES.   Contacts, glasses, dentures or bridgework may not be worn into surgery.  DO NOT BRING YOUR HOME MEDICATIONS TO THE HOSPITAL. PHARMACY WILL DISPENSE MEDICATIONS LISTED ON YOUR MEDICATION LIST TO YOU DURING YOUR ADMISSION IN THE HOSPITAL!    Patients discharged on the day of surgery will not be allowed to drive home.  Someone NEEDS to stay with you for the first 24 hours after anesthesia.              Please read over the following fact sheets you were given: IF YOU HAVE QUESTIONS ABOUT YOUR PRE-OP INSTRUCTIONS PLEASE CALL [phone].   If you received a COVID test during your pre-op visit  it is requested that you wear a mask when out in public, stay away from anyone that may not be feeling well and notify your surgeon if you develop symptoms. If you test positive for Covid or  have been in contact with anyone that has tested positive in the last 10 days please notify you surgeon.    [facility] - Preparing for Surgery Before surgery, you can play an important role.  Because skin is not sterile, your skin needs to be as free of germs as possible.  You can reduce the number of germs on your skin by washing with CHG (chlorahexidine gluconate) soap before surgery.  CHG is an antiseptic cleaner which kills germs and bonds with the skin to continue killing germs even after washing. Please DO NOT use if you have an allergy to CHG or antibacterial soaps.  If your skin becomes reddened/irritated stop using the CHG and inform your nurse when you arrive at Short Stay. Do not shave (including legs and underarms) for at least 48 hours prior to the first CHG shower.  You may shave your face/neck.  Please follow these instructions carefully:  1.  Shower with CHG Soap the night before surgery and the  morning of surgery.  2.  If you choose to wash your hair, wash your hair first as usual with your normal  shampoo.  3.  After you shampoo, rinse your hair and body thoroughly to remove the shampoo.                             4.  Use CHG as you would any other liquid soap.  You can apply chg directly to the skin and wash.  Gently with a scrungie or clean washcloth.  5.  Apply the CHG Soap to your body ONLY FROM THE NECK DOWN.   Do   not use on face/  open                           Wound or open sores. Avoid contact with eyes, ears mouth and   genitals (private parts).                       Wash face,  Genitals (private parts) with your normal soap.             6.  Wash thoroughly, paying special attention to the area where your    surgery  will be performed.  7.  Thoroughly rinse your body with warm water from the neck down.  8.  DO NOT shower/wash with your normal soap after using and rinsing off the CHG Soap.                9.  Pat yourself dry with a clean towel.            10.  Wear  clean pajamas.            11.  Place clean sheets on your bed the night of your first shower and do not  sleep with pets. Day of Surgery : Do not apply any lotions/deodorants the morning of surgery.  Please wear clean clothes to the hospital/surgery center.  FAILURE TO FOLLOW THESE INSTRUCTIONS MAY RESULT IN THE CANCELLATION OF YOUR SURGERY  PATIENT SIGNATURE_________________________________  NURSE SIGNATURE__________________________________  ________________________________________________________________________

## 2023-11-30 ENCOUNTER — Encounter (HOSPITAL_COMMUNITY): Payer: Self-pay

## 2023-11-30 ENCOUNTER — Other Ambulatory Visit: Payer: Self-pay

## 2023-11-30 ENCOUNTER — Encounter (HOSPITAL_COMMUNITY)
Admission: RE | Admit: 2023-11-30 | Discharge: 2023-11-30 | Disposition: A | Discharge status: Home / Self Care | Source: Ambulatory Visit | Attending: Surgery | Admitting: Surgery

## 2023-11-30 VITALS — BP 150/112 | HR 88 | Temp 98.4°F | Resp 14 | Ht 61.5 in | Wt 228.0 lb

## 2023-11-30 DIAGNOSIS — I10 Essential (primary) hypertension: Secondary | ICD-10-CM | POA: Diagnosis not present

## 2023-11-30 DIAGNOSIS — Z01812 Encounter for preprocedural laboratory examination: Secondary | ICD-10-CM | POA: Diagnosis present

## 2023-11-30 HISTORY — DX: Unspecified osteoarthritis, unspecified site: M19.90

## 2023-11-30 LAB — CBC
HCT: 43.6 % (ref 36.0–46.0)
Hemoglobin: 14.1 g/dL (ref 12.0–15.0)
MCH: 28.2 pg (ref 26.0–34.0)
MCHC: 32.3 g/dL (ref 30.0–36.0)
MCV: 87.2 fL (ref 80.0–100.0)
Platelets: 332 K/uL (ref 150–400)
RBC: 5 MIL/uL (ref 3.87–5.11)
RDW: 13 % (ref 11.5–15.5)
WBC: 7.9 K/uL (ref 4.0–10.5)
nRBC: 0 % (ref 0.0–0.2)

## 2023-11-30 LAB — BASIC METABOLIC PANEL WITH GFR
Anion gap: 6 (ref 5–15)
BUN: 6 mg/dL (ref 6–20)
CO2: 22 mmol/L (ref 22–32)
Calcium: 11.3 mg/dL — ABNORMAL HIGH (ref 8.9–10.3)
Chloride: 108 mmol/L (ref 98–111)
Creatinine, Ser: 0.77 mg/dL (ref 0.44–1.00)
GFR, Estimated: >60 mL/min (ref >60)
Glucose, Bld: 110 mg/dL — ABNORMAL HIGH (ref 70–99)
Potassium: 4.2 mmol/L (ref 3.5–5.1)
Sodium: 136 mmol/L (ref 135–145)

## 2023-12-01 ENCOUNTER — Encounter: Payer: Self-pay | Admitting: Radiology

## 2023-12-05 ENCOUNTER — Other Ambulatory Visit: Payer: Self-pay

## 2023-12-05 DIAGNOSIS — Z6841 Body Mass Index (BMI) 40.0 and over, adult: Secondary | ICD-10-CM

## 2023-12-12 ENCOUNTER — Encounter (HOSPITAL_COMMUNITY): Payer: Self-pay | Admitting: Surgery

## 2023-12-12 NOTE — H&P (Signed)
 PROVIDER: Terren Haberle Renee SPINNER, MD  Chief Complaint: Follow-up (Primary hyperparathyroidism)  History of Present Illness:  Patient returns to my practice after a 2-year absence. She had been diagnosed with primary hyperparathyroidism. She underwent imaging studies including an ultrasound, sestamibi scan, and 4D CT scan. 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 Recent laboratory studies show a persistently elevated calcium level of 11.4 with an unsuppressed PTH level of 43. Patient is symptomatic with chronic fatigue and bone and joint discomfort. She denies nephrolithiasis. Patient presents today to update her clinical history and to proceed with scheduling of parathyroidectomy. Patient has had no prior head or neck surgery. She works at [employer].   Review of Systems: A complete review of systems was obtained from the patient. I have reviewed this information and discussed as appropriate with the patient. See HPI as well for other ROS.  Review of Systems  Constitutional: Positive for malaise/fatigue.  HENT: Negative.  Eyes: Negative.  Respiratory: Negative.  Cardiovascular: Negative.  Gastrointestinal: Negative.  Genitourinary: Negative.  Musculoskeletal: Positive for joint pain.  Skin: Negative.  Neurological: Negative.  Endo/Heme/Allergies: Negative.  Psychiatric/Behavioral: Negative.    Medical History: Past Medical History:  Diagnosis Date  Hypertension   Patient Active Problem List  Diagnosis  Primary hyperparathyroidism (CMS/HHS-HCC)   Past Surgical History:  Procedure Laterality Date  CHOLECYSTECTOMY    No Known Allergies  Current Outpatient Medications on File Prior to Visit  Medication Sig Dispense Refill  losartan  (COZAAR ) 100 MG tablet Take 100 mg by mouth once daily  amLODIPine  (NORVASC ) 5 MG tablet Take 5 mg by mouth once daily  cholecalciferol (VITAMIN D3) 5,000 unit capsule Take by mouth (Patient not taking:  Reported on 11/08/2023)  lubiprostone (AMITIZA) 24 MCG capsule Take by mouth (Patient not taking: Reported on 11/08/2023)  multivitamin with minerals tablet Take 1 tablet by mouth once daily (Patient not taking: Reported on 11/08/2023)  naproxen  (NAPROSYN ) 375 MG tablet TAKE 1 TABLET (375 MG TOTAL) BY MOUTH 2 (TWO) TIMES DAILY WITH A MEAL. (Patient not taking: Reported on 11/08/2023)  ondansetron  (ZOFRAN -ODT) 4 MG disintegrating tablet Take 4 mg by mouth every 8 (eight) hours as needed (Patient not taking: Reported on 11/08/2023)   No current facility-administered medications on file prior to visit.   History reviewed. No pertinent family history.   Social History   Tobacco Use  Smoking Status Not on file  Smokeless Tobacco Not on file    Social History   Socioeconomic History  Marital status: Single   Social Drivers of Health   Financial Resource Strain: Medium Risk (08/23/2023)  Received from [facility]  Overall Financial Resource Strain (CARDIA)  Difficulty of Paying Living Expenses: Somewhat hard  Food Insecurity: Food Insecurity Present (08/23/2023)  Received from [facility]  Hunger Vital Sign  Within the past 12 months, you worried that your food would run out before you got the money to buy more.: Sometimes true  Within the past 12 months, the food you bought just didn't last and you didn't have money to get more.: Often true  Transportation Needs: No Transportation Needs (08/23/2023)  Received from [facility] - Transportation  Lack of Transportation (Medical): No  Lack of Transportation (Non-Medical): No  Physical Activity: Inactive (08/23/2023)  Received from [facility]  Exercise Vital Sign  On average, how many days per week do you engage in moderate to strenuous exercise (like a brisk walk)?: 0 days  On average, how many  minutes do you engage in exercise at this level?: 20 min  Stress: No Stress Concern Present (08/23/2023)  Received from [facility] - Occupational Stress Questionnaire  Feeling of Stress : Only a little  Social Connections: Moderately Isolated (08/23/2023)  Received from [facility]  Social Connection and Isolation Panel  In a typical week, how many times do you talk on the phone with family, friends, or neighbors?: Twice a week  How often do you get together with friends or relatives?: Once a week  How often do you attend church or religious services?: 1 to 4 times per year  Do you belong to any clubs or organizations such as church groups, unions, fraternal or athletic groups, or school groups?: No  How often do you attend meetings of the clubs or organizations you belong to?: Never  Are you married, widowed, divorced, separated, never married, or living with a partner?: Divorced   Objective:   Vitals:  BP: (!) 176/122  Pulse: 93  Temp: 36.8 C (98.3 F)  SpO2: 99%  Weight: (!) 107.8 kg (237 lb 9.6 oz)  PainSc: 0-No pain   There is no height or weight on file to calculate BMI.  Physical Exam   GENERAL APPEARANCE Comfortable, no acute issues Development: normal Gross deformities: none  SKIN Rash, lesions, ulcers: none Induration, erythema: none Nodules: none palpable  EYES Conjunctiva and lids: normal Pupils: equal  EARS, NOSE, MOUTH, THROAT External ears: no lesion or deformity External nose: no lesion or deformity Hearing: grossly normal  NECK Symmetric: yes Trachea: midline Thyroid : no palpable nodules in the thyroid  bed  CHEST/CV Not assessed  ABDOMEN Not assessed  GENITOURINARY/RECTAL Not assessed  MUSCULOSKELETAL Station and gait: normal Digits and nails: no clubbing or cyanosis Muscle strength: grossly normal all extremities Deformity: none  LYMPHATIC Cervical: none palpable Supraclavicular: none palpable  PSYCHIATRIC Oriented to person, place, and time: yes Mood and affect: normal for situation Judgment and insight:  appropriate for situation   Assessment and Plan:   Primary hyperparathyroidism (CMS/HHS-HCC)  Patient returns to my practice to update her clinical history and to discuss proceeding with minimally invasive parathyroidectomy for management of primary hyperparathyroidism.  Previous localizing studies show a right sided parathyroid  adenoma. Laboratory studies show persistent hypercalcemia at 11.4.  Today we again discussed minimally invasive parathyroidectomy. We discussed doing this as an outpatient procedure. We discussed the size and location of the surgical incision. We discussed her postoperative recovery and return to work and activities. The patient understands and wishes to proceed.  Renee Spinner, MD [facility] practice Office: [phone]

## 2023-12-13 ENCOUNTER — Ambulatory Visit: Payer: Self-pay | Admitting: *Deleted

## 2023-12-13 NOTE — Telephone Encounter (Signed)
 Copied from CRM #[id number]. Topic: Clinical - Red Word Triage >> Dec 13, 2023  9:23 AM Montie POUR wrote: Red Word that prompted transfer to Nurse Triage:  Her blood pressure is 153/105 and she is taking amLODipine  (NORVASC ) 10 MG tablet and losartan  (COZAAR ) 100 MG tablet. The medications is making her stomach hurt. Pain level is a 7. Reason for Disposition  [1] MODERATE pain (e.g., interferes with normal activities) AND [2] pain comes and goes (cramps) AND [3] present > 24 hours  (Exception: Pain with Vomiting or Diarrhea - see that Guideline.)  Answer Assessment - Initial Assessment Questions 1. LOCATION: Where does it hurt?      Stomach hurting from Coreg .   It's giving me diarrhea and stomach cramps.    I think it's the new one Coreg .   I just started it myself.  The doctor didn't tell me to take it.   But it was giving me nausea before and I stopped it but now that I'm back on it the symptoms have returned.    My BP is up so I started taking it again.    It was 145/99.  I go to the hospital on Friday for surgery.  I'm for a thyroid  surgery for hyperthyroidism.   If my BP is still elevated they won't do the surgery.    2. RADIATION: Does the pain shoot anywhere else? (e.g., chest, back)     No radiation.    Diarrhea I had yesterday because I took the Coreg .  I have been back on the Coreg  for a week now.   I have not taken the Coreg  this morning.   The diarrhea and stomach pain started again the same day I began the Coreg . 3. ONSET: When did the pain begin? (e.g., minutes, hours or days ago)      See above 4. SUDDEN: Gradual or sudden onset?     Not asked 5. PATTERN Does the pain come and go, or is it constant?     It comes and goes the nausea and diarrhea.   6. SEVERITY: How bad is the pain?  (e.g., Scale 1-10; mild, moderate, or severe)     Diarrhea and cramping with nausea 7. RECURRENT SYMPTOM: Have you ever had this type of stomach pain before? If Yes, ask: When was the last  time? and What happened that time?      Yes when I was on the Coreg  before.   I restarted it on my own.   8. CAUSE: What do you think is causing the stomach pain? (e.g., gallstones, recent abdominal surgery)     The Coreg . 9. RELIEVING/AGGRAVATING FACTORS: What makes it better or worse? (e.g., antacids, bending or twisting motion, bowel movement)     Stopping the Coreg  10. OTHER SYMPTOMS: Do you have any other symptoms? (e.g., back pain, diarrhea, fever, urination pain, vomiting)       See above 11. PREGNANCY: Is there any chance you are pregnant? When was your last menstrual period?       Not asked  Protocols used: Abdominal Pain - Female-A-AH FYI Only or Action Required?: FYI only for provider.  Patient was last seen in primary care on 07/19/2023 by Bevely Doffing, FNP.  Called Nurse Triage reporting Abdominal Pain. Coreg  causing her to have diarrhea and stomach pain.  She stopped it and then restarted it a week ago on her own because she has surgery coming up on Friday for her thyroid .   Was trying to  get her BP down or the surgery will be cancelled.   Symptoms began a week ago.  Interventions attempted: Prescription medications: restarted taking Coreg  for BP being elevated.  Symptoms are: unchanged.BP remains elevated.    Triage Disposition: See Physician Within 24 Hours  Patient/caregiver understands and will follow disposition?: Yes

## 2023-12-13 NOTE — Telephone Encounter (Signed)
 Patient scheduled.

## 2023-12-14 ENCOUNTER — Ambulatory Visit: Admitting: Internal Medicine

## 2023-12-14 ENCOUNTER — Other Ambulatory Visit (HOSPITAL_COMMUNITY): Payer: Self-pay

## 2023-12-14 ENCOUNTER — Encounter: Payer: Self-pay | Admitting: Internal Medicine

## 2023-12-14 VITALS — BP 136/89 | HR 76 | Ht 62.0 in | Wt 230.0 lb

## 2023-12-14 DIAGNOSIS — E210 Primary hyperparathyroidism: Secondary | ICD-10-CM | POA: Diagnosis not present

## 2023-12-14 DIAGNOSIS — I10 Essential (primary) hypertension: Secondary | ICD-10-CM | POA: Diagnosis not present

## 2023-12-14 DIAGNOSIS — R197 Diarrhea, unspecified: Secondary | ICD-10-CM | POA: Diagnosis not present

## 2023-12-14 DIAGNOSIS — Z1231 Encounter for screening mammogram for malignant neoplasm of breast: Secondary | ICD-10-CM

## 2023-12-14 NOTE — Progress Notes (Signed)
 Anesthesia Chart Review   Case: [id number] Date/Time: 12/15/23 0715   Procedure: PARATHYROIDECTOMY (Right)   Anesthesia type: General   Diagnosis: Primary hyperparathyroidism (HCC) [E21.0]   Pre-op diagnosis: PRIMARY HYPERPARATHYROIDISM   Location[facility] / [facility]   Surgeons: Eletha Boas, MD       DISCUSSION:43 y.o. smoker with h/o HTN, primary hyperparathyroidism scheduled for above procedure 12/15/2023 with Dr. Boas Eletha.   Pt advised to hold Wegovy  1 week prior to procedure.   Elevated HTN at PAT visit.    Pt had a follow up with PCP 12/14/2023. BP at this visit 136/89. She is currently on Losartan  and Amlodipine . Per OV note, Although Uncontrolled with losartan  100 mg QD and amlodipine  10 mg QD, would prefer to avoid adding any new medicine as she is planning to get a procedure tomorrow Follow-up after parathyroidectomy - BP usually improves after the parathyroid  surgery, may not need to add any new medicine If uncontrolled BP after the procedure, may consider adding diuretic.   BP improved at PCP visit today.  Evaluate tomorrow DOS.  VS: BP (!) 150/112   Pulse 88   Temp 36.9 C (Oral)   Resp 14   Ht 5' 1.5 (1.562 m)   Wt 103.4 kg   LMP 11/13/2023   SpO2 98%   BMI 42.38 kg/m   PROVIDERS: Bevely Doffing, FNP is PCP    LABS: Labs reviewed: Acceptable for surgery. (all labs ordered are listed, but only abnormal results are displayed)  Labs Reviewed  BASIC METABOLIC PANEL WITH GFR - Abnormal; Notable for the following components:      Result Value   Glucose, Bld 110 (*)    Calcium 11.3 (*)    All other components within normal limits  CBC     IMAGES:   EKG:   CV:  Past Medical History:  Diagnosis Date   Arthritis    Elevated serum creatinine 02/19/2021   Fibroids    Herpes simplex virus (HSV) infection    no OB +HSV 05/04/16 at [facility]    Hypertension    Hypothyroidism    Pancreatitis    Vaginal Pap smear, abnormal     Past Surgical History:   Procedure Laterality Date   BIOPSY  03/31/2022   Procedure: BIOPSY;  Surgeon: Wilhelmenia Aloha Raddle., MD;  Location: [facility];  Service: Gastroenterology;;   CHOLECYSTECTOMY N/A 04/13/2020   Procedure: LAPAROSCOPIC CHOLECYSTECTOMY WITH INTRAOPERATIVE CHOLANGIOGRAM;  Surgeon: Eletha Boas, MD;  [facility];  Service: General;  Laterality: N/A;   ESOPHAGOGASTRODUODENOSCOPY (EGD) WITH PROPOFOL  N/A 03/31/2022   Procedure: ESOPHAGOGASTRODUODENOSCOPY (EGD) WITH PROPOFOL ;  Surgeon: Wilhelmenia Aloha Raddle., MD;  Location: [facility];  Service: Gastroenterology;  Laterality: N/A;   EUS N/A 03/31/2022   Procedure: UPPER ENDOSCOPIC ULTRASOUND (EUS) LINEAR;  Surgeon: Wilhelmenia Aloha Raddle., MD;  [facility];  Service: Gastroenterology;  Laterality: N/A;   KNEE SURGERY Left    TONSILLECTOMY      MEDICATIONS:  amLODipine  (NORVASC ) 10 MG tablet   losartan  (COZAAR ) 100 MG tablet   semaglutide -weight management (WEGOVY ) 1.7 MG/0.75ML SOAJ SQ injection   No current facility-administered medications for this encounter.     Harlene Hoots Ward, PA-C [facility] [phone]

## 2023-12-14 NOTE — Assessment & Plan Note (Signed)
 Followed by endocrinology Planned to get parathyroidectomy tomorrow

## 2023-12-14 NOTE — Assessment & Plan Note (Signed)
 Attributes her diarrhea to carvedilol , although less likely, advised to stop carvedilol  for now Will need GI follow-up if she has persistent diarrhea Advised to hold Wegovy  for now

## 2023-12-14 NOTE — Progress Notes (Signed)
 Established Patient Office Visit  Subjective:  Patient ID: Renee [name], female    DOB: 06-27-1980  Age: 43 y.o. MRN: [id number]  CC:  Chief Complaint  Patient presents with   Hypertension    Pt reports high bp readings, is having surgery tomorrow has concerns   Diarrhea    Reports sx of diarrhea and nausea    HPI Renee [name] is a 43 y.o. female with past medical history of HTN and hyperparathyroidism who presents for f/u of HTN.  HTN: Her BP was borderline elevated today.  She takes losartan  100 mg daily and amlodipine  10 mg daily currently.  She had been prescribed carvedilol  12.5 mg twice daily by her previous PCP -Dr. Melvenia, which she had stopped due to episodes of nausea and diarrhea.  She tried to take it yesterday as her blood pressure was elevated, but had episode of diarrhea again.  She is undergoing parathyroidectomy tomorrow.  Past Medical History:  Diagnosis Date   Arthritis    Elevated serum creatinine 02/19/2021   Fibroids    Herpes simplex virus (HSV) infection    no OB +HSV 05/04/16 at [facility]    Hypertension    Hypothyroidism    Pancreatitis    Vaginal Pap smear, abnormal     Past Surgical History:  Procedure Laterality Date   BIOPSY  03/31/2022   Procedure: BIOPSY;  Surgeon: Wilhelmenia Aloha Raddle., MD;  Location: [facility];  Service: Gastroenterology;;   CHOLECYSTECTOMY N/A 04/13/2020   Procedure: LAPAROSCOPIC CHOLECYSTECTOMY WITH INTRAOPERATIVE CHOLANGIOGRAM;  Surgeon: Eletha Boas, MD;  [facility];  Service: General;  Laterality: N/A;   ESOPHAGOGASTRODUODENOSCOPY (EGD) WITH PROPOFOL  N/A 03/31/2022   Procedure: ESOPHAGOGASTRODUODENOSCOPY (EGD) WITH PROPOFOL ;  Surgeon: Wilhelmenia Aloha Raddle., MD;  Location: [facility];  Service: Gastroenterology;  Laterality: N/A;   EUS N/A 03/31/2022   Procedure: UPPER ENDOSCOPIC ULTRASOUND (EUS) LINEAR;  Surgeon: Wilhelmenia Aloha Raddle., MD;  [facility];  Service: Gastroenterology;   Laterality: N/A;   KNEE SURGERY Left    TONSILLECTOMY      Family History  Problem Relation Age of Onset   Congestive Heart Failure Maternal Grandmother    Hypertension Maternal Grandmother    Colon polyps Maternal Grandmother    Kidney disease Maternal Grandmother    Other Maternal Grandfather        aneursym   Kidney disease Maternal Grandfather        on dialysis   Congestive Heart Failure Maternal Grandfather    Colon polyps Maternal Grandfather    Diabetes Neg Hx    Pancreatic cancer Neg Hx    Stomach cancer Neg Hx    Colon cancer Neg Hx    Esophageal cancer Neg Hx    Inflammatory bowel disease Neg Hx    Liver disease Neg Hx    Rectal cancer Neg Hx     Social History   Socioeconomic History   Marital status: Single    Spouse name: Not on file   Number of children: Not on file   Years of education: Not on file   Highest education level: 12th grade  Occupational History   Not on file  Tobacco Use   Smoking status: Some Days    Current packs/day: 0.00    Average packs/day: 0.3 packs/day for 17.0 years (4.3 ttl pk-yrs)    Types: Cigarettes    Start date: 01/09/2005    Last attempt to quit: 01/09/2022    Years since quitting: 1.9   Smokeless tobacco:  Never   Tobacco comments:    smokes 2-3 cig daily  Vaping Use   Vaping status: Never Used  Substance and Sexual Activity   Alcohol use: Yes    Comment: 1-2 times a month   Drug use: Not Currently    Types: Marijuana    Comment: occasionally   Sexual activity: Yes    Birth control/protection: None, Other-see comments    Comment: pull out  Other Topics Concern   Not on file  Social History Narrative   Not on file   Social Drivers of Health   Financial Resource Strain: Medium Risk (08/23/2023)   Overall Financial Resource Strain (CARDIA)    Difficulty of Paying Living Expenses: Somewhat hard  Food Insecurity: Food Insecurity Present (08/23/2023)   Hunger Vital Sign    Worried About Running Out of Food in  the Last Year: Sometimes true    Ran Out of Food in the Last Year: Often true  Transportation Needs: No Transportation Needs (08/23/2023)   PRAPARE - Administrator, Civil Service (Medical): No    Lack of Transportation (Non-Medical): No  Physical Activity: Inactive (08/23/2023)   Exercise Vital Sign    Days of Exercise per Week: 0 days    Minutes of Exercise per Session: 20 min  Stress: No Stress Concern Present (08/23/2023)   [employer] of Occupational Health - Occupational Stress Questionnaire    Feeling of Stress : Only a little  Social Connections: Moderately Isolated (08/23/2023)   Social Connection and Isolation Panel    Frequency of Communication with Friends and Family: Twice a week    Frequency of Social Gatherings with Friends and Family: Once a week    Attends Religious Services: 1 to 4 times per year    Active Member of [employer] or Organizations: No    Attends Banker Meetings: Never    Marital Status: Divorced  Catering manager Violence: Not At Risk (08/23/2023)   Humiliation, Afraid, Rape, and Kick questionnaire    Fear of Current or Ex-Partner: No    Emotionally Abused: No    Physically Abused: No    Sexually Abused: No    Outpatient Medications Prior to Visit  Medication Sig Dispense Refill   amLODipine  (NORVASC ) 10 MG tablet Take 1 tablet (10 mg total) by mouth daily. 90 tablet 3   losartan  (COZAAR ) 100 MG tablet Take 100 mg by mouth daily.     semaglutide -weight management (WEGOVY ) 1.7 MG/0.75ML SOAJ SQ injection INJECT 1.7 MG INTO THE SKIN ONCE A WEEK FOR 28 DAYS. 3 mL 2   carvedilol  (COREG ) 12.5 MG tablet TAKE 1 TABLET (12.5MG  TOTAL) BY MOUTH TWICE A DAY WITH MEALS 180 tablet 1   meloxicam  (MOBIC ) 7.5 MG tablet Take 1 tablet (7.5 mg total) by mouth daily. (Patient not taking: Reported on 11/30/2023) 30 tablet 0   No facility-administered medications prior to visit.    Allergies  Allergen Reactions   Carvedilol      Diarrhea     ROS Review of Systems  Constitutional:  Negative for chills and fever.  HENT:  Negative for congestion, sinus pressure, sinus pain and sore throat.   Eyes:  Negative for pain and discharge.  Respiratory:  Negative for cough and shortness of breath.   Cardiovascular:  Negative for chest pain and palpitations.  Gastrointestinal:  Positive for diarrhea. Negative for abdominal pain, nausea and vomiting.  Endocrine: Negative for polydipsia and polyuria.  Genitourinary:  Negative for dysuria and hematuria.  Musculoskeletal:  Negative for neck pain and neck stiffness.  Skin:  Negative for rash.  Neurological:  Negative for dizziness and weakness.  Psychiatric/Behavioral:  Negative for agitation and behavioral problems.       Objective:    Physical Exam Vitals reviewed.  Constitutional:      General: She is not in acute distress.    Appearance: She is not diaphoretic.  HENT:     Head: Normocephalic and atraumatic.     Nose: Nose normal. No congestion.     Mouth/Throat:     Mouth: Mucous membranes are moist.     Pharynx: No posterior oropharyngeal erythema.  Eyes:     General: No scleral icterus.    Extraocular Movements: Extraocular movements intact.  Cardiovascular:     Rate and Rhythm: Regular rhythm.     Heart sounds: Normal heart sounds. No murmur heard. Pulmonary:     Breath sounds: Normal breath sounds. No wheezing or rales.  Abdominal:     Palpations: Abdomen is soft.     Tenderness: There is no abdominal tenderness.  Musculoskeletal:     Cervical back: Neck supple. No tenderness.     Right lower leg: No edema.     Left lower leg: No edema.  Skin:    General: Skin is warm.     Findings: No rash.  Neurological:     General: No focal deficit present.     Mental Status: She is alert and oriented to person, place, and time.  Psychiatric:        Mood and Affect: Mood normal.        Behavior: Behavior normal.     BP 136/89   Pulse 76   Ht 5' 2 (1.575 m)    Wt 230 lb (104.3 kg)   LMP 11/13/2023   SpO2 100%   BMI 42.07 kg/m  Wt Readings from Last 3 Encounters:  12/14/23 230 lb (104.3 kg)  11/30/23 228 lb (103.4 kg)  09/01/23 245 lb 4 oz (111.2 kg)    Lab Results  Component Value Date   TSH 1.09 09/01/2023   Lab Results  Component Value Date   WBC 7.9 11/30/2023   HGB 14.1 11/30/2023   HCT 43.6 11/30/2023   MCV 87.2 11/30/2023   PLT 332 11/30/2023   Lab Results  Component Value Date   NA 136 11/30/2023   K 4.2 11/30/2023   CO2 22 11/30/2023   GLUCOSE 110 (H) 11/30/2023   BUN 6 11/30/2023   CREATININE 0.77 11/30/2023   BILITOT 0.3 09/01/2023   ALKPHOS 68 09/01/2023   AST 12 09/01/2023   ALT 9 09/01/2023   PROT 7.4 09/01/2023   ALBUMIN 4.1 09/01/2023   CALCIUM 11.3 (H) 11/30/2023   ANIONGAP 6 11/30/2023   EGFR 108 06/03/2022   GFR 104.69 09/01/2023   Lab Results  Component Value Date   CHOL 128 01/12/2022   Lab Results  Component Value Date   HDL 48 01/12/2022   Lab Results  Component Value Date   LDLCALC 67 01/12/2022   Lab Results  Component Value Date   TRIG 66 01/12/2022   Lab Results  Component Value Date   CHOLHDL 2.7 01/12/2022   Lab Results  Component Value Date   HGBA1C 5.6 01/21/2022      Assessment & Plan:   Problem List Items Addressed This Visit       Cardiovascular and Mediastinum   Essential hypertension - Primary   BP Readings from Last 1 Encounters:  12/14/23 136/89   Although Uncontrolled with losartan  100 mg QD and amlodipine  10 mg QD, would prefer to avoid adding any new medicine as she is planning to get a procedure tomorrow Follow-up after parathyroidectomy - BP usually improves after the parathyroid  surgery, may not need to add any new medicine If uncontrolled BP after the procedure, may consider adding diuretic Counseled for compliance with the medications Advised DASH diet and moderate exercise/walking, at least 150 mins/week        Endocrine    Hyperparathyroidism, primary (HCC)   Followed by endocrinology Planned to get parathyroidectomy tomorrow        Other   Diarrhea   Attributes her diarrhea to carvedilol , although less likely, advised to stop carvedilol  for now Will need GI follow-up if she has persistent diarrhea Advised to hold Wegovy  for now       No orders of the defined types were placed in this encounter.   Follow-up: Return in about 1 month (around 01/13/2024) for HTN and weight management with PCP.    Suzzane MARLA Blanch, MD

## 2023-12-14 NOTE — Patient Instructions (Addendum)
 Please schedule mammogram after your surgery.  Please continue Losartan  and Amlodipine  for now. Please stop Carvedilol .  Please continue to follow low carb diet and perform moderate exercise/walking at least 150 mins/week.

## 2023-12-14 NOTE — Assessment & Plan Note (Signed)
 BP Readings from Last 1 Encounters:  12/14/23 136/89   Although Uncontrolled with losartan  100 mg QD and amlodipine  10 mg QD, would prefer to avoid adding any new medicine as she is planning to get a procedure tomorrow Follow-up after parathyroidectomy - BP usually improves after the parathyroid  surgery, may not need to add any new medicine If uncontrolled BP after the procedure, may consider adding diuretic Counseled for compliance with the medications Advised DASH diet and moderate exercise/walking, at least 150 mins/week

## 2023-12-15 ENCOUNTER — Other Ambulatory Visit: Payer: Self-pay

## 2023-12-15 ENCOUNTER — Encounter (HOSPITAL_COMMUNITY): Payer: Self-pay | Admitting: Surgery

## 2023-12-15 ENCOUNTER — Ambulatory Visit (HOSPITAL_COMMUNITY): Payer: Self-pay | Admitting: Medical

## 2023-12-15 ENCOUNTER — Encounter (HOSPITAL_COMMUNITY)
Admission: RE | Disposition: A | Discharge status: Home / Self Care | Payer: Self-pay | Source: Home / Self Care | Attending: Surgery

## 2023-12-15 ENCOUNTER — Ambulatory Visit (HOSPITAL_COMMUNITY)
Admission: RE | Admit: 2023-12-15 | Discharge: 2023-12-15 | Disposition: A | Discharge status: Home / Self Care | Attending: Surgery | Admitting: Surgery

## 2023-12-15 ENCOUNTER — Other Ambulatory Visit (HOSPITAL_COMMUNITY): Payer: Self-pay

## 2023-12-15 ENCOUNTER — Ambulatory Visit (HOSPITAL_COMMUNITY): Payer: Self-pay | Admitting: Anesthesiology

## 2023-12-15 DIAGNOSIS — F418 Other specified anxiety disorders: Secondary | ICD-10-CM | POA: Diagnosis not present

## 2023-12-15 DIAGNOSIS — I10 Essential (primary) hypertension: Secondary | ICD-10-CM | POA: Diagnosis not present

## 2023-12-15 DIAGNOSIS — Z6841 Body Mass Index (BMI) 40.0 and over, adult: Secondary | ICD-10-CM | POA: Insufficient documentation

## 2023-12-15 DIAGNOSIS — F17200 Nicotine dependence, unspecified, uncomplicated: Secondary | ICD-10-CM | POA: Diagnosis not present

## 2023-12-15 DIAGNOSIS — F17210 Nicotine dependence, cigarettes, uncomplicated: Secondary | ICD-10-CM

## 2023-12-15 DIAGNOSIS — E210 Primary hyperparathyroidism: Secondary | ICD-10-CM | POA: Insufficient documentation

## 2023-12-15 DIAGNOSIS — E66813 Obesity, class 3: Secondary | ICD-10-CM | POA: Diagnosis not present

## 2023-12-15 HISTORY — PX: PARATHYROIDECTOMY: SHX19

## 2023-12-15 LAB — POCT PREGNANCY, URINE: Preg Test, Ur: NEGATIVE

## 2023-12-15 SURGERY — PARATHYROIDECTOMY
Anesthesia: General | Site: Neck | Laterality: Right

## 2023-12-15 MED ORDER — FENTANYL CITRATE (PF) 100 MCG/2ML IJ SOLN
INTRAMUSCULAR | Status: AC
  Filled 2023-12-15: qty 2

## 2023-12-15 MED ORDER — ROCURONIUM BROMIDE 10 MG/ML (PF) SYRINGE
PREFILLED_SYRINGE | INTRAVENOUS | Status: DC | PRN
  Administered 2023-12-15: 60 mg via INTRAVENOUS

## 2023-12-15 MED ORDER — PROPOFOL 10 MG/ML IV BOLUS
INTRAVENOUS | Status: AC
  Filled 2023-12-15: qty 20

## 2023-12-15 MED ORDER — TRAMADOL HCL 50 MG PO TABS
50.0000 mg | ORAL_TABLET | Freq: Four times a day (QID) | ORAL | 0 refills | Status: DC | PRN
  Filled 2023-12-15: qty 12, 2d supply, fill #0

## 2023-12-15 MED ORDER — CHLORHEXIDINE GLUCONATE CLOTH 2 % EX PADS: 6.0000 | MEDICATED_PAD | Freq: Once | CUTANEOUS | Status: DC

## 2023-12-15 MED ORDER — LIDOCAINE HCL (PF) 2 % IJ SOLN
INTRAMUSCULAR | Status: AC
  Filled 2023-12-15: qty 5

## 2023-12-15 MED ORDER — LIDOCAINE HCL (PF) 2 % IJ SOLN
INTRAMUSCULAR | Status: DC | PRN
  Administered 2023-12-15: 100 mg via INTRADERMAL

## 2023-12-15 MED ORDER — AMISULPRIDE (ANTIEMETIC) 5 MG/2ML IV SOLN: 10.0000 mg | Freq: Once | INTRAVENOUS | Status: DC | PRN

## 2023-12-15 MED ORDER — ROCURONIUM BROMIDE 10 MG/ML (PF) SYRINGE
PREFILLED_SYRINGE | INTRAVENOUS | Status: AC
  Filled 2023-12-15: qty 10

## 2023-12-15 MED ORDER — BUPIVACAINE HCL 0.25 % IJ SOLN
INTRAMUSCULAR | Status: DC | PRN
  Administered 2023-12-15: 9 mL

## 2023-12-15 MED ORDER — ACETAMINOPHEN 500 MG PO TABS
1000.0000 mg | ORAL_TABLET | Freq: Once | ORAL | Status: AC
  Administered 2023-12-15: 1000 mg via ORAL
  Filled 2023-12-15: qty 2

## 2023-12-15 MED ORDER — SUGAMMADEX SODIUM 200 MG/2ML IV SOLN
INTRAVENOUS | Status: DC | PRN
  Administered 2023-12-15: 200 mg via INTRAVENOUS

## 2023-12-15 MED ORDER — CEFAZOLIN SODIUM-DEXTROSE 2-4 GM/100ML-% IV SOLN
2.0000 g | INTRAVENOUS | Status: AC
  Administered 2023-12-15: 2 g via INTRAVENOUS
  Filled 2023-12-15: qty 100

## 2023-12-15 MED ORDER — MIDAZOLAM HCL 5 MG/5ML IJ SOLN
INTRAMUSCULAR | Status: DC | PRN
  Administered 2023-12-15: 2 mg via INTRAVENOUS

## 2023-12-15 MED ORDER — PROPOFOL 10 MG/ML IV BOLUS
INTRAVENOUS | Status: DC | PRN
  Administered 2023-12-15: 170 mg via INTRAVENOUS

## 2023-12-15 MED ORDER — SUGAMMADEX SODIUM 200 MG/2ML IV SOLN
INTRAVENOUS | Status: AC
  Filled 2023-12-15: qty 2

## 2023-12-15 MED ORDER — 0.9 % SODIUM CHLORIDE (POUR BTL) OPTIME
TOPICAL | Status: DC | PRN
  Administered 2023-12-15: 1000 mL

## 2023-12-15 MED ORDER — ARTIFICIAL TEARS OPHTHALMIC OINT
TOPICAL_OINTMENT | OPHTHALMIC | Status: AC
  Filled 2023-12-15: qty 3.5

## 2023-12-15 MED ORDER — FENTANYL CITRATE PF 50 MCG/ML IJ SOSY
25.0000 ug | PREFILLED_SYRINGE | INTRAMUSCULAR | Status: DC | PRN
  Administered 2023-12-15 (×2): 50 ug via INTRAVENOUS

## 2023-12-15 MED ORDER — MIDAZOLAM HCL 2 MG/2ML IJ SOLN
INTRAMUSCULAR | Status: AC
Start: 2023-12-15 — End: 2023-12-15
  Filled 2023-12-15: qty 2

## 2023-12-15 MED ORDER — FENTANYL CITRATE (PF) 100 MCG/2ML IJ SOLN
INTRAMUSCULAR | Status: DC | PRN
  Administered 2023-12-15: 50 ug via INTRAVENOUS
  Administered 2023-12-15: 100 ug via INTRAVENOUS

## 2023-12-15 MED ORDER — OXYCODONE HCL 5 MG PO TABS
ORAL_TABLET | ORAL | Status: AC
  Filled 2023-12-15: qty 1

## 2023-12-15 MED ORDER — ORAL CARE MOUTH RINSE: 15.0000 mL | Freq: Once | OROMUCOSAL | Status: AC

## 2023-12-15 MED ORDER — FENTANYL CITRATE PF 50 MCG/ML IJ SOSY
PREFILLED_SYRINGE | INTRAMUSCULAR | Status: AC
Start: 2023-12-15 — End: 2023-12-15
  Filled 2023-12-15: qty 2

## 2023-12-15 MED ORDER — BUPIVACAINE HCL (PF) 0.25 % IJ SOLN
INTRAMUSCULAR | Status: AC
  Filled 2023-12-15: qty 30

## 2023-12-15 MED ORDER — PHENYLEPHRINE HCL-NACL 20-0.9 MG/250ML-% IV SOLN
INTRAVENOUS | Status: DC | PRN
Start: 2023-12-15 — End: 2023-12-15
  Administered 2023-12-15: 40 ug/min via INTRAVENOUS

## 2023-12-15 MED ORDER — ONDANSETRON HCL 4 MG/2ML IJ SOLN
INTRAMUSCULAR | Status: DC | PRN
  Administered 2023-12-15: 4 mg via INTRAVENOUS

## 2023-12-15 MED ORDER — ONDANSETRON HCL 4 MG/2ML IJ SOLN
INTRAMUSCULAR | Status: AC
  Filled 2023-12-15: qty 2

## 2023-12-15 MED ORDER — CHLORHEXIDINE GLUCONATE 0.12 % MT SOLN
15.0000 mL | Freq: Once | OROMUCOSAL | Status: AC
  Administered 2023-12-15: 15 mL via OROMUCOSAL

## 2023-12-15 MED ORDER — ONDANSETRON HCL 4 MG/2ML IJ SOLN: 4.0000 mg | Freq: Once | INTRAMUSCULAR | Status: DC | PRN

## 2023-12-15 MED ORDER — DEXAMETHASONE SODIUM PHOSPHATE 10 MG/ML IJ SOLN
INTRAMUSCULAR | Status: AC
  Filled 2023-12-15: qty 1

## 2023-12-15 MED ORDER — LACTATED RINGERS IV SOLN: INTRAVENOUS | Status: DC

## 2023-12-15 MED ORDER — DEXAMETHASONE SODIUM PHOSPHATE 10 MG/ML IJ SOLN
INTRAMUSCULAR | Status: DC | PRN
  Administered 2023-12-15: 5 mg via INTRAVENOUS

## 2023-12-15 MED ORDER — HEMOSTATIC AGENTS (NO CHARGE) OPTIME
TOPICAL | Status: DC | PRN
  Administered 2023-12-15: 1 via TOPICAL

## 2023-12-15 SURGICAL SUPPLY — 29 items
ATTRACTOMAT 16X20 MAGNETIC DRP (DRAPES) ×1 IMPLANT
BAG COUNTER SPONGE SURGICOUNT (BAG) ×1 IMPLANT
BLADE SURG 15 STRL LF DISP TIS (BLADE) ×1 IMPLANT
CHLORAPREP W/TINT 26 (MISCELLANEOUS) ×1 IMPLANT
CLIP TI MEDIUM 6 (CLIP) ×2 IMPLANT
CLIP TI WIDE RED SMALL 6 (CLIP) ×2 IMPLANT
COVER SURGICAL LIGHT HANDLE (MISCELLANEOUS) ×1 IMPLANT
DERMABOND ADVANCED .7 DNX12 (GAUZE/BANDAGES/DRESSINGS) ×1 IMPLANT
DRAPE LAPAROTOMY T 98X78 PEDS (DRAPES) ×1 IMPLANT
DRAPE UTILITY XL STRL (DRAPES) ×1 IMPLANT
ELECT PENCIL ROCKER SW 15FT (MISCELLANEOUS) ×1 IMPLANT
ELECT REM PT RETURN 15FT ADLT (MISCELLANEOUS) ×1 IMPLANT
GAUZE 4X4 16PLY ~~LOC~~+RFID DBL (SPONGE) ×1 IMPLANT
GLOVE SURG ORTHO 8.0 STRL STRW (GLOVE) ×1 IMPLANT
GOWN STRL REUS W/ TWL XL LVL3 (GOWN DISPOSABLE) ×3 IMPLANT
HEMOSTAT SURGICEL 2X4 FIBR (HEMOSTASIS) ×1 IMPLANT
ILLUMINATOR WAVEGUIDE N/F (MISCELLANEOUS) IMPLANT
KIT BASIN OR (CUSTOM PROCEDURE TRAY) ×1 IMPLANT
KIT TURNOVER KIT A (KITS) ×1 IMPLANT
NDL HYPO 22X1.5 SAFETY MO (MISCELLANEOUS) ×1 IMPLANT
NEEDLE HYPO 22X1.5 SAFETY MO (MISCELLANEOUS) ×1 IMPLANT
PACK BASIC VI WITH GOWN DISP (CUSTOM PROCEDURE TRAY) ×1 IMPLANT
SHEARS HARMONIC 9CM CVD (BLADE) ×1 IMPLANT
SUT MNCRL AB 4-0 PS2 18 (SUTURE) ×1 IMPLANT
SUT VIC AB 3-0 SH 18 (SUTURE) ×1 IMPLANT
SYR BULB IRRIG 60ML STRL (SYRINGE) ×1 IMPLANT
SYR CONTROL 10ML LL (SYRINGE) ×1 IMPLANT
TOWEL OR 17X26 10 PK STRL BLUE (TOWEL DISPOSABLE) ×1 IMPLANT
TUBING CONNECTING 10 (TUBING) ×1 IMPLANT

## 2023-12-15 NOTE — Interval H&P Note (Signed)
 History and Physical Interval Note:  12/15/2023 6:59 AM  Renee [name]  has presented today for surgery, with the diagnosis of PRIMARY HYPERPARATHYROIDISM.  The various methods of treatment have been discussed with the patient and family. After consideration of risks, benefits and other options for treatment, the patient has consented to    Procedure(s): PARATHYROIDECTOMY (Right) as a surgical intervention.    The patient's history has been reviewed, patient examined, no change in status, stable for surgery.  I have reviewed the patient's chart and labs.  Questions were answered to the patient's satisfaction.    Krystal Spinner, MD [facility] practice Office: [phone]   Krystal Spinner

## 2023-12-15 NOTE — Transfer of Care (Signed)
 Immediate Anesthesia Transfer of Care Note  Patient: Renee [name]  Procedure(s) Performed: PARATHYROIDECTOMY (Right: Neck)  Patient Location: PACU  Anesthesia Type:General  Level of Consciousness: awake, alert , oriented, and patient cooperative  Airway & Oxygen Therapy: Patient Spontanous Breathing and Patient connected to face mask oxygen  Post-op Assessment: Report given to RN and Post -op Vital signs reviewed and stable  Post vital signs: Reviewed and stable  Last Vitals:  Vitals Value Taken Time  BP 130/87 12/15/23 08:53  Temp 36.8 C 12/15/23 08:53  Pulse 76 12/15/23 08:55  Resp 19 12/15/23 08:55  SpO2 100 % 12/15/23 08:55  Vitals shown include unfiled device data.  Last Pain:  Vitals:   12/15/23 0548  TempSrc: Oral  PainSc: 0-No pain         Complications: No notable events documented.

## 2023-12-15 NOTE — Anesthesia Preprocedure Evaluation (Addendum)
 Anesthesia Evaluation  Patient identified by MRN, date of birth, ID band Patient awake    Reviewed: Allergy & Precautions, NPO status , Patient's Chart, lab work & pertinent test results  Airway Mallampati: III  TM Distance: >3 FB Neck ROM: Full    Dental  (+) Teeth Intact, Dental Advisory Given   Pulmonary Current Smoker and Patient abstained from smoking.   Pulmonary exam normal breath sounds clear to auscultation       Cardiovascular hypertension, Pt. on medications Normal cardiovascular exam Rhythm:Regular Rate:Normal     Neuro/Psych  PSYCHIATRIC DISORDERS Anxiety Depression    negative neurological ROS     GI/Hepatic negative GI ROS, Neg liver ROS,,,  Endo/Other  Hypothyroidism  Class 3 obesityPRIMARY HYPERPARATHYROIDISM  Renal/GU negative Renal ROS     Musculoskeletal  (+) Arthritis ,    Abdominal   Peds  Hematology negative hematology ROS (+)   Anesthesia Other Findings Day of surgery medications reviewed with the patient.  Reproductive/Obstetrics negative OB ROS                              Anesthesia Physical Anesthesia Plan  ASA: 3  Anesthesia Plan: General   Post-op Pain Management: Tylenol  PO (pre-op)* and Toradol  IV (intra-op)*   Induction: Intravenous  PONV Risk Score and Plan: 3 and Midazolam , Dexamethasone  and Ondansetron   Airway Management Planned: Oral ETT  Additional Equipment:   Intra-op Plan:   Post-operative Plan: Extubation in OR  Informed Consent: I have reviewed the patients History and Physical, chart, labs and discussed the procedure including the risks, benefits and alternatives for the proposed anesthesia with the patient or authorized representative who has indicated his/her understanding and acceptance.     Dental advisory given  Plan Discussed with: CRNA  Anesthesia Plan Comments:          Anesthesia Quick Evaluation

## 2023-12-15 NOTE — Discharge Instructions (Addendum)

## 2023-12-15 NOTE — Op Note (Signed)
 OPERATIVE REPORT - PARATHYROIDECTOMY  Preoperative diagnosis: Primary hyperparathyroidism  Postop diagnosis: Same  Procedure: Right inferior minimally invasive parathyroidectomy  Surgeon:  Krystal Spinner, MD  Anesthesia: General endotracheal  Estimated blood loss: Minimal  Preparation: ChloraPrep  Indications: Patient returns to my practice after a 2-year absence. She had been diagnosed with primary hyperparathyroidism. She underwent imaging studies including an ultrasound, sestamibi scan, and 4D CT scan. 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 Recent laboratory studies show a persistently elevated calcium level of 11.4 with an unsuppressed PTH level of 43. Patient is symptomatic with chronic fatigue and bone and joint discomfort. She denies nephrolithiasis. Patient presents today to update her clinical history and to proceed with scheduling of parathyroidectomy. Patient has had no prior head or neck surgery. She works at [employer].   Procedure: The patient was prepared in the pre-operative holding area. The patient was brought to the operating room and placed in a supine position on the operating room table. Following administration of general anesthesia, the patient was positioned and then prepped and draped in the usual strict aseptic fashion. After ascertaining that an adequate level of anesthesia been achieved, a neck incision was made with a #15 blade. Dissection was carried through subcutaneous tissues and platysma. Hemostasis was obtained with the electrocautery. Skin flaps were developed circumferentially and a Weitlander retractor was placed for exposure.  Strap muscles were incised in the midline. Strap muscles were reflected laterally exposing the thyroid  lobe. With gentle blunt dissection the right thyroid  lobe was mobilized.  Dissection was carried posteriorly and an enlarged parathyroid  gland was identified posterior to the thyroid  and just inferior  to the inferior thyroid  artery. It was gently mobilized. Vascular structures were divided between small ligaclips. Care was taken to avoid the recurrent laryngeal nerve. The parathyroid  gland was completely excised. It was submitted to pathology where frozen section confirmed hypercellular parathyroid  tissue consistent with adenoma.  A second nodular area was identified just posterior to the inferior thyroid  artery.  This was mobilized and resected.  Vascular structures were divided between ligaclips.  The harmonic scalpel was used to completely excise the nodule.  It was submitted for frozen section confirming thyroid  tissue.  Neck was irrigated with warm saline and good hemostasis was noted. Fibrillar was placed in the operative field. Strap muscles were approximated in the midline with interrupted 3-0 Vicryl sutures. Platysma was closed with interrupted 3-0 Vicryl sutures. Marcaine  was infiltrated circumferentially. Skin was closed with a running 4-0 Monocryl subcuticular suture. Wound was washed and dried and Dermabond was applied. Patient was awakened from anesthesia and brought to the recovery room. The patient tolerated the procedure well.   Krystal Spinner, MD [facility] Surgery Office: [phone]

## 2023-12-15 NOTE — Anesthesia Postprocedure Evaluation (Signed)
 Anesthesia Post Note  Patient: Renee [name]  Procedure(s) Performed: PARATHYROIDECTOMY (Right: Neck)     Patient location during evaluation: PACU Anesthesia Type: General Level of consciousness: awake and alert Pain management: pain level controlled Vital Signs Assessment: post-procedure vital signs reviewed and stable Respiratory status: spontaneous breathing, nonlabored ventilation, respiratory function stable and patient connected to nasal cannula oxygen Cardiovascular status: blood pressure returned to baseline and stable Postop Assessment: no apparent nausea or vomiting Anesthetic complications: no   No notable events documented.  Last Vitals:  Vitals:   12/15/23 0945 12/15/23 0955  BP: 128/82 130/79  Pulse: 77 81  Resp: 13   Temp:  36.8 C  SpO2: 92% 93%    Last Pain:  Vitals:   12/15/23 0955  TempSrc:   PainSc: 5                  Garnette FORBES Skillern

## 2023-12-15 NOTE — Anesthesia Procedure Notes (Signed)
 Procedure Name: Intubation Date/Time: 12/15/2023 7:23 AM  Performed by: Franchot Delon RAMAN, CRNAPre-anesthesia Checklist: Patient identified, Emergency Drugs available, Suction available and Patient being monitored Patient Re-evaluated:Patient Re-evaluated prior to induction Oxygen Delivery Method: Circle System Utilized Preoxygenation: Pre-oxygenation with 100% oxygen Induction Type: IV induction Ventilation: Mask ventilation without difficulty and Oral airway inserted - appropriate to patient size Laryngoscope Size: Mac and 3 Grade View: Grade I Tube type: Oral Tube size: 7.0 mm Number of attempts: 1 Airway Equipment and Method: Stylet and Oral airway Placement Confirmation: ETT inserted through vocal cords under direct vision, positive ETCO2 and breath sounds checked- equal and bilateral Secured at: 22 cm Tube secured with: Tape Dental Injury: Teeth and Oropharynx as per pre-operative assessment

## 2023-12-16 ENCOUNTER — Encounter (HOSPITAL_COMMUNITY): Payer: Self-pay | Admitting: Surgery

## 2023-12-18 ENCOUNTER — Other Ambulatory Visit (HOSPITAL_COMMUNITY): Payer: Self-pay

## 2023-12-18 LAB — SURGICAL PATHOLOGY

## 2023-12-20 ENCOUNTER — Ambulatory Visit: Admitting: Gastroenterology

## 2023-12-25 ENCOUNTER — Other Ambulatory Visit (HOSPITAL_COMMUNITY): Payer: Self-pay

## 2023-12-27 ENCOUNTER — Emergency Department (HOSPITAL_COMMUNITY)

## 2023-12-27 ENCOUNTER — Emergency Department (HOSPITAL_COMMUNITY): Admission: EM | Admit: 2023-12-27 | Discharge: 2023-12-27 | Disposition: A | Discharge status: Home / Self Care

## 2023-12-27 ENCOUNTER — Other Ambulatory Visit: Payer: Self-pay

## 2023-12-27 ENCOUNTER — Encounter (HOSPITAL_COMMUNITY): Payer: Self-pay

## 2023-12-27 DIAGNOSIS — R221 Localized swelling, mass and lump, neck: Secondary | ICD-10-CM | POA: Diagnosis present

## 2023-12-27 DIAGNOSIS — Z9889 Other specified postprocedural states: Secondary | ICD-10-CM | POA: Diagnosis not present

## 2023-12-27 DIAGNOSIS — Z9089 Acquired absence of other organs: Secondary | ICD-10-CM | POA: Diagnosis not present

## 2023-12-27 LAB — COMPREHENSIVE METABOLIC PANEL WITH GFR
ALT: 13 U/L (ref 0–44)
AST: 12 U/L — ABNORMAL LOW (ref 15–41)
Albumin: 3.7 g/dL (ref 3.5–5.0)
Alkaline Phosphatase: 91 U/L (ref 38–126)
Anion gap: 8 (ref 5–15)
BUN: 12 mg/dL (ref 6–20)
CO2: 24 mmol/L (ref 22–32)
Calcium: 8.6 mg/dL — ABNORMAL LOW (ref 8.9–10.3)
Chloride: 106 mmol/L (ref 98–111)
Creatinine, Ser: 1.11 mg/dL — ABNORMAL HIGH (ref 0.44–1.00)
GFR, Estimated: >60 mL/min (ref >60)
Glucose, Bld: 101 mg/dL — ABNORMAL HIGH (ref 70–99)
Potassium: 3.9 mmol/L (ref 3.5–5.1)
Sodium: 138 mmol/L (ref 135–145)
Total Bilirubin: 0.6 mg/dL (ref 0.0–1.2)
Total Protein: 7.2 g/dL (ref 6.5–8.1)

## 2023-12-27 LAB — CBC WITH DIFFERENTIAL/PLATELET
Abs Immature Granulocytes: 0.03 K/uL (ref 0.00–0.07)
Basophils Absolute: 0 K/uL (ref 0.0–0.1)
Basophils Relative: 0 %
Eosinophils Absolute: 0.2 K/uL (ref 0.0–0.5)
Eosinophils Relative: 3 %
HCT: 40 % (ref 36.0–46.0)
Hemoglobin: 12.6 g/dL (ref 12.0–15.0)
Immature Granulocytes: 0 %
Lymphocytes Relative: 13 %
Lymphs Abs: 1.2 K/uL (ref 0.7–4.0)
MCH: 27.1 pg (ref 26.0–34.0)
MCHC: 31.5 g/dL (ref 30.0–36.0)
MCV: 86 fL (ref 80.0–100.0)
Monocytes Absolute: 0.8 K/uL (ref 0.1–1.0)
Monocytes Relative: 8 %
Neutro Abs: 6.9 K/uL (ref 1.7–7.7)
Neutrophils Relative %: 76 %
Platelets: 356 K/uL (ref 150–400)
RBC: 4.65 MIL/uL (ref 3.87–5.11)
RDW: 12.8 % (ref 11.5–15.5)
WBC: 9.1 K/uL (ref 4.0–10.5)
nRBC: 0 % (ref 0.0–0.2)

## 2023-12-27 LAB — MAGNESIUM: Magnesium: 2.1 mg/dL (ref 1.7–2.4)

## 2023-12-27 LAB — HCG, SERUM, QUALITATIVE: Preg, Serum: NEGATIVE

## 2023-12-27 MED ORDER — IOHEXOL 300 MG/ML  SOLN
75.0000 mL | Freq: Once | INTRAMUSCULAR | Status: AC | PRN
  Administered 2023-12-27: 75 mL via INTRAVENOUS

## 2023-12-27 MED ORDER — AMOXICILLIN-POT CLAVULANATE 875-125 MG PO TABS
1.0000 | ORAL_TABLET | Freq: Once | ORAL | Status: AC
  Administered 2023-12-27: 1 via ORAL
  Filled 2023-12-27: qty 1

## 2023-12-27 MED ORDER — AMOXICILLIN-POT CLAVULANATE 875-125 MG PO TABS
1.0000 | ORAL_TABLET | Freq: Two times a day (BID) | ORAL | 0 refills | Status: DC
Start: 2023-12-27 — End: 2024-02-06

## 2023-12-27 NOTE — Discharge Instructions (Signed)
 Take your Augmentin  as prescribed.  Call your surgeons office tomorrow at 9 AM when they open and tell them you were in the emergency department and you are having some swelling around your surgical site.  They should be able to get you an appointment promptly

## 2023-12-27 NOTE — ED Provider Notes (Signed)
 [place] EMERGENCY DEPARTMENT AT [facility] Provider Note   CSN: [id number] Arrival date & time: 12/27/23  8367     Patient presents with: Post-op Problem   Renee [name] is a 43 y.o. female.    43 year old female presents for evaluation of neck discomfort.  States she feels like something is caught in her throat.  She recently had a parathyroidectomy and has noticed some increased swelling around her surgical site as well.  Denies any difficulty swallowing.  States she feels very thirsty.  Denies any other symptoms or concerns.        Prior to Admission medications   Medication Sig Start Date End Date Taking? Authorizing Provider  amoxicillin -clavulanate (AUGMENTIN ) 875-125 MG tablet Take 1 tablet by mouth every 12 (twelve) hours. 12/27/23  Yes Tawnia Schirm L, DO  amLODipine  (NORVASC ) 10 MG tablet Take 1 tablet (10 mg total) by mouth daily. 03/17/23   Melvenia Manus BRAVO, MD  losartan  (COZAAR ) 100 MG tablet Take 100 mg by mouth daily.    [provider]  semaglutide -weight management (WEGOVY ) 1.7 MG/0.75ML SOAJ SQ injection INJECT 1.7 MG INTO THE SKIN ONCE A WEEK FOR 28 DAYS. 12/05/23   Bevely Doffing, FNP  traMADol  (ULTRAM ) 50 MG tablet Take 1-2 tablets (50-100 mg total) by mouth every 6 (six) hours as needed for moderate pain (pain score 4-6). 12/15/23   Eletha Boas, MD    Allergies: Carvedilol     Review of Systems  Constitutional:  Negative for chills and fever.  HENT:  Negative for ear pain and sore throat.   Eyes:  Negative for pain and visual disturbance.  Respiratory:  Negative for cough and shortness of breath.   Cardiovascular:  Negative for chest pain and palpitations.  Gastrointestinal:  Negative for abdominal pain and vomiting.  Genitourinary:  Negative for dysuria and hematuria.  Musculoskeletal:  Negative for arthralgias and back pain.  Skin:  Negative for color change and rash.  Neurological:  Negative for seizures and syncope.  All other  systems reviewed and are negative.   Updated Vital Signs BP 129/84   Pulse 74   Temp 99.5 F (37.5 C) (Oral)   Resp 18   Ht 5' 1 (1.549 m)   Wt 104.3 kg   LMP 11/13/2023 Comment: negative UPT as of 12/15/23  SpO2 98%   BMI 43.46 kg/m   Physical Exam Vitals and nursing note reviewed.  Constitutional:      General: She is not in acute distress.    Appearance: Normal appearance. She is well-developed. She is not ill-appearing.  HENT:     Head: Normocephalic and atraumatic.  Eyes:     Conjunctiva/sclera: Conjunctivae normal.  Neck:     Comments: Parathyroidectomy scar appears well-healed without signs of infection, there is fairly significant swelling around the scar Cardiovascular:     Rate and Rhythm: Normal rate and regular rhythm.     Heart sounds: No murmur heard. Pulmonary:     Effort: Pulmonary effort is normal. No respiratory distress.     Breath sounds: Normal breath sounds.  Abdominal:     Palpations: Abdomen is soft.     Tenderness: There is no abdominal tenderness.  Musculoskeletal:        General: No swelling.     Cervical back: Neck supple.  Skin:    General: Skin is warm and dry.     Capillary Refill: Capillary refill takes less than 2 seconds.  Neurological:     Mental Status: She  is alert.  Psychiatric:        Mood and Affect: Mood normal.     (all labs ordered are listed, but only abnormal results are displayed) Labs Reviewed  COMPREHENSIVE METABOLIC PANEL WITH GFR - Abnormal; Notable for the following components:      Result Value   Glucose, Bld 101 (*)    Creatinine, Ser 1.11 (*)    Calcium 8.6 (*)    AST 12 (*)    All other components within normal limits  CBC WITH DIFFERENTIAL/PLATELET  MAGNESIUM  HCG, SERUM, QUALITATIVE  CALCIUM, IONIZED    EKG: None  Radiology: CT Soft Tissue Neck W Contrast Result Date: 12/27/2023 CLINICAL DATA:  neck pain, recent thyroid  surgery, swelling EXAM: CT NECK WITH CONTRAST TECHNIQUE: Multidetector CT  imaging of the neck was performed using the standard protocol following the bolus administration of intravenous contrast. RADIATION DOSE REDUCTION: This exam was performed according to the departmental dose-optimization program which includes automated exposure control, adjustment of the mA and/or kV according to patient size and/or use of iterative reconstruction technique. CONTRAST:  75mL OMNIPAQUE  IOHEXOL  300 MG/ML  SOLN COMPARISON:  None Available. FINDINGS: Pharynx and larynx: Normal. No mass or swelling. Salivary glands: No inflammation, mass, or stone. Thyroid :Approximately 4.0 by 1.3 cm fluid collection without discrete capsule overlying the thyroid . The thyroid  itself is unremarkable. Lymph nodes: None enlarged or abnormal density. Vascular: Not well assessed due to non arterial contrast timing. Limited intracranial: Negative. Visualized orbits: Negative. Mastoids and visualized paranasal sinuses: Clear. Skeleton: No acute or aggressive process. Upper chest: Lung apices are clear. IMPRESSION: Approximately 4.0 x 1.3 cm fluid collection without discrete capsule overlying the thyroid , most likely postoperative given history of recent thyroid  surgery. Findings are sterility indeterminate and early/developing abscess is not excluded. Electronically Signed   By: Gilmore GORMAN Molt M.D.   On: 12/27/2023 20:48     Procedures   Medications Ordered in the ED  iohexol  (OMNIPAQUE ) 300 MG/ML solution 75 mL (75 mLs Intravenous Contrast Given 12/27/23 1857)  amoxicillin -clavulanate (AUGMENTIN ) 875-125 MG per tablet 1 tablet (1 tablet Oral Given 12/27/23 2259)                                    Medical Decision Making Patient here for neck swelling after a parathyroidectomy.  Her calcium is minimally lower than normal.  Labs otherwise fairly unremarkable.  Her CT scan of her neck showed swelling in the postop region but cannot rule out abscess.  She has no clinical signs of abscess, no fever no leukocytosis.   She is protecting her airway, tolerating her secretions, able to swallow and has stable vital signs.  I spoke with surgeon on-call for [facility] and he recommended antibiotics to start tonight and will have her obtain close follow-up with her surgeon.  He recommended she call the office for Singh in the morning but he will also send a message to the team to get her in for an appointment.  This was all discussed with the patient and she is agreeable with the plan.  Will give her prescription for Augmentin .  She is given her first dose here.  Advised Tylenol  Motrin  as needed for pain and return for any new or worsening symptoms.  Problems Addressed: Neck swelling: acute illness or injury S/P parathyroidectomy: chronic illness or injury with exacerbation, progression, or side effects of treatment  Amount and/or Complexity of Data  Reviewed External Data Reviewed: notes.    Details: Outpatient records reviewed and patient had parathyroidectomy on 12-15-2023 Labs: ordered. Decision-making details documented in ED Course.    Details: Ordered and reviewed by me and unremarkable Radiology: ordered and independent interpretation performed. Decision-making details documented in ED Course.    Details: CT neck with contrast shows evidence of swelling in the postoperation and fluid collection that is 4 cm x 1.5 cm and cannot rule out abscess Discussion of management or test interpretation with external provider(s): On-call physician for [facility]-I spoke with him on the phone and he recommended patient follow-up in the office tomorrow.  Recommended starting Augmentin   Risk OTC drugs. Prescription drug management.     Final diagnoses:  S/P parathyroidectomy  Neck swelling    ED Discharge Orders          Ordered    amoxicillin -clavulanate (AUGMENTIN ) 875-125 MG tablet  Every 12 hours        12/27/23 2254               Skylynne Schlechter L, DO 12/27/23 2323

## 2023-12-27 NOTE — ED Triage Notes (Signed)
 Patient recently had parathyroid  removed and has been having difficulty swallowing and feeling as though there is a knot in her throat and feels dehydrated.  Patient reports fingers and toes are tingling too.  No airway compromised at this time.  Able to swallow secretions.

## 2023-12-29 LAB — CALCIUM, IONIZED: Calcium, Ionized, Serum: 4.9 mg/dL (ref 4.5–5.6)

## 2024-01-03 ENCOUNTER — Ambulatory Visit (HOSPITAL_COMMUNITY)

## 2024-01-03 ENCOUNTER — Telehealth: Payer: Self-pay | Admitting: Nurse Practitioner

## 2024-01-03 DIAGNOSIS — Z9889 Other specified postprocedural states: Secondary | ICD-10-CM

## 2024-01-03 DIAGNOSIS — E213 Hyperparathyroidism, unspecified: Secondary | ICD-10-CM

## 2024-01-03 NOTE — Telephone Encounter (Signed)
 Corres from Dr. Eletha under Media, pt currently does not have a follow up here

## 2024-01-03 NOTE — Telephone Encounter (Signed)
 She did labs with surgeon showing successful treatment.  Lets plan to bring her back in 3 months with another set of labs.  I will enter those.

## 2024-01-04 NOTE — Telephone Encounter (Signed)
 Made pt appt and sent mychart message with appt date and to do labs, mailed lab order and appt date

## 2024-01-08 ENCOUNTER — Ambulatory Visit (HOSPITAL_COMMUNITY)

## 2024-01-17 ENCOUNTER — Telehealth: Payer: Self-pay | Admitting: Pharmacy Technician

## 2024-01-17 ENCOUNTER — Ambulatory Visit (HOSPITAL_COMMUNITY)

## 2024-01-17 ENCOUNTER — Other Ambulatory Visit (HOSPITAL_COMMUNITY): Payer: Self-pay

## 2024-01-17 NOTE — Telephone Encounter (Signed)
 Pharmacy Patient Advocate Encounter   Received notification from CoverMyMeds that prior authorization for Wegovy  1.7MG /0.75ML auto-injectors is required/requested.   Insurance verification completed.   The patient is insured through [employer].   Patient does not meet the new coverage criteria that went into effect on 01/10/2024. Wegovy  is no longer covered when used for weight loss.

## 2024-02-06 ENCOUNTER — Ambulatory Visit

## 2024-02-06 ENCOUNTER — Other Ambulatory Visit (HOSPITAL_COMMUNITY): Payer: Self-pay

## 2024-02-06 VITALS — BP 155/102 | HR 84 | Ht 62.0 in | Wt 226.1 lb

## 2024-02-06 DIAGNOSIS — Z6841 Body Mass Index (BMI) 40.0 and over, adult: Secondary | ICD-10-CM | POA: Diagnosis not present

## 2024-02-06 DIAGNOSIS — I10 Essential (primary) hypertension: Secondary | ICD-10-CM | POA: Diagnosis not present

## 2024-02-06 DIAGNOSIS — Z23 Encounter for immunization: Secondary | ICD-10-CM | POA: Diagnosis not present

## 2024-02-06 MED ORDER — AMLODIPINE BESYLATE 10 MG PO TABS: 10.0000 mg | ORAL_TABLET | Freq: Every day | ORAL | 2 refills | Status: AC

## 2024-02-06 MED ORDER — LOSARTAN POTASSIUM 100 MG PO TABS: 100.0000 mg | ORAL_TABLET | Freq: Every day | ORAL | 2 refills | Status: AC

## 2024-02-06 NOTE — Progress Notes (Unsigned)
 Established Patient Office Visit  Subjective   Patient ID: Renee [name], female    DOB: [birth year]  Age: 43 y.o. MRN: [id number]  Chief Complaint  Patient presents with   Medical Management of Chronic Issues    Follow up    HPI Discussed the use of AI scribe software for clinical note transcription with the patient, who gave verbal consent to proceed.  History of Present Illness   Renee [name] is a 43 year old female who presents for follow-up after parathyroid  surgery and medication management.  Postoperative status following parathyroidectomy - Recovering from parathyroid  surgery - Experiencing tenderness and soreness at the surgical site - Improved energy levels since surgery  Hypertension and antihypertensive medication adherence - Has not taken losartan  and amlodipine  for a couple of days - No recent headaches, ear issues, or stroke symptoms  Obesity and weight management - Insurance discontinued coverage for Wegovy  as of October 1st - Discontinued Wegovy  use - Since stopping Wegovy , experiencing increased stomach activity and irritation after eating - Aims to lose 30 pounds prior to knee surgery - Difficulty exercising due to knee pain described as 'bone on bone'  Glycemic control - A1c last checked two years ago - Blood sugars have been normal since last check      Patient Active Problem List   Diagnosis Date Noted   Diarrhea 12/14/2023   Trichomoniasis 08/28/2023   Routine general medical examination at a health care facility 08/23/2023   BMI 40.0-44.9, adult (HCC) 07/20/2023   Abdominal pain, epigastric 06/20/2023   Loose stools 06/20/2023   Chronic pancreatitis (HCC) 06/20/2023   Loud snoring 04/28/2022   Anxiety and depression 04/14/2022   Constipation 02/08/2022   History of vitamin D  deficiency 01/26/2022   Risk factors for obstructive sleep apnea 01/26/2022   Positive screening for depression on 9-item Patient Health Questionnaire (PHQ-9)  01/26/2022   Encounter to establish care 01/26/2022   Elevated LFTs    Essential hypertension 01/12/2022   UTI (urinary tract infection) 01/12/2022   Pancreatic duct dilated    Acute on chronic pancreatitis (HCC) 01/11/2022   Hypercalcemia 02/19/2021   Elevated serum creatinine 02/19/2021   Screening mammogram for breast cancer 02/16/2021   Encounter for screening fecal occult blood testing 02/16/2021   Encounter for gynecological examination with Papanicolaou smear of cervix 02/16/2021   Cholelithiasis with chronic cholecystitis 04/13/2020   Cholelithiasis with cholecystitis 04/02/2020   Hyperparathyroidism, primary 04/02/2020   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/2015      ROS    Objective:     BP (!) 155/102   Pulse 84   Ht 5' 2 (1.575 m)   Wt 226 lb 1.9 oz (102.6 kg)   SpO2 98%   BMI 41.36 kg/m  BP Readings from Last 3 Encounters:  02/06/24 (!) 155/102  12/27/23 129/84  12/15/23 130/79   Wt Readings from Last 3 Encounters:  02/06/24 226 lb 1.9 oz (102.6 kg)  12/27/23 230 lb (104.3 kg)  12/15/23 230 lb (104.3 kg)     Physical Exam Vitals and nursing note reviewed.  Constitutional:      Appearance: Normal appearance.  HENT:     Head: Normocephalic.  Eyes:     Extraocular Movements: Extraocular movements intact.     Pupils: Pupils are equal, round, and reactive to light.  Cardiovascular:     Rate and Rhythm: Normal rate and regular rhythm.  Pulmonary:     Effort: Pulmonary effort is normal.     Breath  sounds: Normal breath sounds.  Musculoskeletal:     Cervical back: Normal range of motion and neck supple.  Neurological:     Mental Status: She is alert and oriented to person, place, and time.  Psychiatric:        Mood and Affect: Mood normal.        Thought Content: Thought content normal.      No results found for any visits on 02/06/24.  Last CBC Lab Results  Component Value Date   WBC 9.1 12/27/2023   HGB 12.6 12/27/2023   HCT 40.0 12/27/2023    MCV 86.0 12/27/2023   MCH 27.1 12/27/2023   RDW 12.8 12/27/2023   PLT 356 12/27/2023   Last metabolic panel Lab Results  Component Value Date   GLUCOSE 101 (H) 12/27/2023   NA 138 12/27/2023   K 3.9 12/27/2023   CL 106 12/27/2023   CO2 24 12/27/2023   BUN 12 12/27/2023   CREATININE 1.11 (H) 12/27/2023   GFRNONAA >60 12/27/2023   CALCIUM 8.6 (L) 12/27/2023   PHOS 2.0 (L) 01/12/2022   PROT 7.2 12/27/2023   ALBUMIN 3.7 12/27/2023   LABGLOB 2.7 06/03/2022   AGRATIO 1.7 06/03/2022   BILITOT 0.6 12/27/2023   ALKPHOS 91 12/27/2023   AST 12 (L) 12/27/2023   ALT 13 12/27/2023   ANIONGAP 8 12/27/2023   Last lipids Lab Results  Component Value Date   CHOL 128 01/12/2022   HDL 48 01/12/2022   LDLCALC 67 01/12/2022   TRIG 66 01/12/2022   CHOLHDL 2.7 01/12/2022   Last hemoglobin A1c Lab Results  Component Value Date   HGBA1C 5.6 01/21/2022   Last thyroid  functions Lab Results  Component Value Date   TSH 1.09 09/01/2023   FREET4 1.66 01/21/2022   Last vitamin D  Lab Results  Component Value Date   VD25OH 25.4 (L) 01/21/2022   Last vitamin B12 and Folate No results found for: VITAMINB12, FOLATE    The ASCVD Risk score (Arnett DK, et al., 2019) failed to calculate for the following reasons:   The valid total cholesterol range is 130 to 320 mg/dL    Assessment & Plan:   Problem List Items Addressed This Visit       Cardiovascular and Mediastinum   Essential hypertension   Uncontrolled hypertension at 160/90 mmHg due to non-adherence to losartan  and amlodipine . - Refilled losartan  and amlodipine  prescriptions. - Advised resuming antihypertensive medications. - Monitor blood pressure regularly. - Advised reducing sodium intake. - Discussed importance of controlling blood pressure to prevent complications. - Aim for blood pressure target of <130/80 mmHg.      Relevant Medications   amLODipine  (NORVASC ) 10 MG tablet   losartan  (COZAAR ) 100 MG tablet      Other   BMI 40.0-44.9, adult (HCC)   Obesity management hindered by discontinuation of Wegovy . Phentermine considered but contraindicated until blood pressure is controlled. Weight loss needed for knee surgery. - Advised maintaining small portion sizes and staying hydrated. - Consider phentermine once blood pressure is controlled. - Encouraged weight loss for knee surgery, targeting 30 pounds.      Other Visit Diagnoses       Encounter for immunization    -  Primary   Relevant Orders   Flu vaccine trivalent PF, 6mos and older(Flulaval,Afluria,Fluarix,Fluzone) (Completed)       No follow-ups on file.    Leita Longs, FNP

## 2024-02-09 NOTE — Assessment & Plan Note (Signed)
 Uncontrolled hypertension at 160/90 mmHg due to non-adherence to losartan  and amlodipine . - Refilled losartan  and amlodipine  prescriptions. - Advised resuming antihypertensive medications. - Monitor blood pressure regularly. - Advised reducing sodium intake. - Discussed importance of controlling blood pressure to prevent complications. - Aim for blood pressure target of <130/80 mmHg.

## 2024-02-09 NOTE — Assessment & Plan Note (Signed)
 Obesity management hindered by discontinuation of Wegovy . Phentermine considered but contraindicated until blood pressure is controlled. Weight loss needed for knee surgery. - Advised maintaining small portion sizes and staying hydrated. - Consider phentermine once blood pressure is controlled. - Encouraged weight loss for knee surgery, targeting 30 pounds.

## 2024-02-12 ENCOUNTER — Encounter: Payer: Self-pay | Admitting: Radiology

## 2024-02-21 ENCOUNTER — Ambulatory Visit: Admitting: Gastroenterology

## 2024-04-08 ENCOUNTER — Ambulatory Visit: Admitting: Nurse Practitioner

## 2024-04-08 DIAGNOSIS — Z9889 Other specified postprocedural states: Secondary | ICD-10-CM

## 2024-04-30 ENCOUNTER — Ambulatory Visit: Admitting: Gastroenterology

## 2024-05-13 ENCOUNTER — Telehealth: Payer: Self-pay

## 2024-05-13 NOTE — Telephone Encounter (Signed)
 Copied from CRM #[id number]. Topic: Clinical - Medication Question >> May 10, 2024  2:51 PM Emylou G wrote: Reason for CRM: Pls call patient.. is interested in getting wegovy  again - said ins is now approving it.. can we do a script?  Or does she need to be seen.. she does use the [facility]..

## 2024-05-17 ENCOUNTER — Other Ambulatory Visit

## 2024-05-17 ENCOUNTER — Encounter: Payer: Self-pay | Admitting: Gastroenterology

## 2024-05-17 ENCOUNTER — Ambulatory Visit: Payer: Self-pay | Admitting: Gastroenterology

## 2024-05-17 ENCOUNTER — Ambulatory Visit: Admitting: Gastroenterology

## 2024-05-17 VITALS — BP 186/110 | HR 76 | Ht 60.75 in | Wt 239.0 lb

## 2024-05-17 DIAGNOSIS — R195 Other fecal abnormalities: Secondary | ICD-10-CM

## 2024-05-17 DIAGNOSIS — I10 Essential (primary) hypertension: Secondary | ICD-10-CM

## 2024-05-17 DIAGNOSIS — K8689 Other specified diseases of pancreas: Secondary | ICD-10-CM

## 2024-05-17 DIAGNOSIS — K861 Other chronic pancreatitis: Secondary | ICD-10-CM

## 2024-05-17 DIAGNOSIS — R1013 Epigastric pain: Secondary | ICD-10-CM

## 2024-05-17 DIAGNOSIS — R1084 Generalized abdominal pain: Secondary | ICD-10-CM

## 2024-05-17 LAB — CBC WITH DIFFERENTIAL/PLATELET
Basophils Absolute: 0 10*3/uL (ref 0.0–0.1)
Basophils Relative: 0.7 % (ref 0.0–3.0)
Eosinophils Absolute: 0.1 10*3/uL (ref 0.0–0.7)
Eosinophils Relative: 1.8 % (ref 0.0–5.0)
HCT: 38.5 % (ref 36.0–46.0)
Hemoglobin: 12.5 g/dL (ref 12.0–15.0)
Lymphocytes Relative: 21.5 % (ref 12.0–46.0)
Lymphs Abs: 1.2 10*3/uL (ref 0.7–4.0)
MCHC: 32.4 g/dL (ref 30.0–36.0)
MCV: 82.6 fl (ref 78.0–100.0)
Monocytes Absolute: 0.6 10*3/uL (ref 0.1–1.0)
Monocytes Relative: 10.1 % (ref 3.0–12.0)
Neutro Abs: 3.6 10*3/uL (ref 1.4–7.7)
Neutrophils Relative %: 65.9 % (ref 43.0–77.0)
Platelets: 302 10*3/uL (ref 150.0–400.0)
RBC: 4.67 Mil/uL (ref 3.87–5.11)
RDW: 13.9 % (ref 11.5–15.5)
WBC: 5.5 10*3/uL (ref 4.0–10.5)

## 2024-05-17 LAB — COMPREHENSIVE METABOLIC PANEL WITH GFR
ALT: 7 U/L (ref 3–35)
AST: 13 U/L (ref 5–37)
Albumin: 3.9 g/dL (ref 3.5–5.2)
Alkaline Phosphatase: 66 U/L (ref 39–117)
BUN: 10 mg/dL (ref 6–23)
CO2: 28 meq/L (ref 19–32)
Calcium: 8.6 mg/dL (ref 8.4–10.5)
Chloride: 102 meq/L (ref 96–112)
Creatinine, Ser: 0.76 mg/dL (ref 0.40–1.20)
GFR: 96 mL/min
Glucose, Bld: 98 mg/dL (ref 70–99)
Potassium: 3.6 meq/L (ref 3.5–5.1)
Sodium: 137 meq/L (ref 135–145)
Total Bilirubin: 0.3 mg/dL (ref 0.2–1.2)
Total Protein: 7.1 g/dL (ref 6.0–8.3)

## 2024-05-17 LAB — LIPASE: Lipase: 29 U/L (ref 11.0–59.0)

## 2024-05-17 MED ORDER — HYOSCYAMINE SULFATE 0.125 MG SL SUBL: 0.1250 mg | SUBLINGUAL_TABLET | Freq: Three times a day (TID) | SUBLINGUAL | 0 refills | Status: AC | PRN

## 2024-05-17 NOTE — Progress Notes (Signed)
 "  Renee [name] [id number] [birth year]   Chief Complaint: Dilated pancreatic duct  Referring Provider: Bevely Doffing, FNP Primary GI MD: Dr. Wilhelmenia  HPI: Renee [name] is a 44 y.o. female with past medical history of HTN, hypothyroidism, obesity, chronic pancreatitis (complicated by pancreatic duct stone and ductal dilation), cholecystectomy 2022, parathyroidectomy 12/2023 who presents today for follow-up.    Last seen in office 09/01/2023 by Dr. Wilhelmenia at the request of /[facility] team to evaluate abdominal pain and change in bowel habits, to see if they were occurring as a result of her known pancreatic duct stone.  Patient endorsed significant postprandial abdominal pain over the previous 3 to 4 months, often accompanied by diarrhea and exacerbated by greasy/fatty foods.  She is postcholecystectomy.  Did not feel that these episodes were like her previous pancreatitis attacks.  Had recently started Wegovy  but symptoms predated initiation of this medication.  Weight stable.  The indications for pancreatic ductal stone extraction or pancreatic ductal stenting attempt would be recurrent bouts of pancreatitis, progressive pancreatic ductal dilation, signs of progressive exocrine pancreas insufficiency, or obvious new finding of malignancy or solid lesions.  Plan was to proceed with MRI/MRCP and pending findings consider role of repeat EUS +/- ERCP.   Stool studies ordered including stool culture, O&P, C. difficile, fecal calprotectin, fecal pancreatic elastase, fecal fat. Symptoms sounded more like IBS and she was started on Levsin , advised to follow-up with primary GI team for further workup of abdominal pain and discomfort.  Labs 09/01/2023: Normal CBC, elevated calcium otherwise normal CMP, normal lipase, normal TSH, normal inflammatory markers, negative for celiac disease.  Normal fecal fat, pancreatic elastase, calprotectin.  Negative infectious studies.  She was  advised to follow-up with PCP regarding hypercalcemia, and they referred to endocrinology based on her history of hyperparathyroidism.  She has since undergone parathyroidectomy 12/2023.  MRI/MRCP showed that the pancreas looked relatively stable and unchanged from previous CT done a couple years prior.  PD measurements were slightly larger than previously and patient was offered upper EUS to reevaluate the pancreas and pancreatic duct.  We had been unable to contact patient regarding scheduling, she ultimately wanted to discuss further in the office first, but missed her last 3 appointments with Dr. Wilhelmenia.   Discussed the use of AI scribe software for clinical note transcription with the patient, who gave verbal consent to proceed.  History of Present Illness Renee [name] is a 44 year old female with chronic pancreatitis and a pancreatic duct stone who presents for follow-up of ongoing abdominal pain after eating.  Chronic Pancreatitis and Pancreatic Duct Stone: - Chronic pancreatitis with dilated pancreatic duct and history of ductal stone - Imaging includes MRI in May and prior CT - Endoscopic ultrasound performed in 2023 - History of cholecystectomy and parathyroidectomy  Postprandial Abdominal Pain: - Abdominal pain occurs after eating - Pain is sharp, severe, and sometimes described as like a bad, bad menstrual cramp with a squeezing sensation - Associated with urgency to have a bowel movement - Pain is usually central, occasionally radiates to the back - May improve after a bowel movement but can last 10-15 minutes or persist - Symptoms occur at least weekly but not daily - No consistent dietary triggers identified - No use of Levsin  or over-the-counter remedies for abdominal pain  Bowel Habit Changes: - Bowel movements alternate between diarrhea and firmness - No blood or melena - No nausea, vomiting, or fever  Gastrointestinal Symptoms Post-Cholecystectomy: - No  acid reflux or heartburn since gallbladder removal  Hypertension: - Takes two antihypertensive medications daily - Did not take antihypertensive medications today  Musculoskeletal Pain: - Uses ibuprofen  occasionally for knee pain   Previous GI Procedures/Imaging   MRCP 09/08/2023 IMPRESSION: 1. Unchanged appearance of the pancreas, with dense susceptibility artifact in the pancreatic neck/body junction and severe atrophy of the pancreatic tail, with associated distal pancreatic ductal dilatation. Findings are consistent with sequelae of chronic pancreatitis, including a large calcification in the pancreatic neck better visible by prior CT. 2. No evidence of acute pancreatitis. 3. Status post cholecystectomy.  January 2024 CTAP with contrast IMPRESSION: 1. No acute findings are noted in the abdomen or pelvis to account for the patient's symptoms. 2. Large calcification in the body of the pancreas with evidence of chronic proximal ductal obstruction and chronic atrophy throughout the body and tail of the pancreas, similar to prior studies, likely sequela of chronic pancreatitis. 3. Nonobstructive calculi in the right renal collecting system measuring up to 4 mm in the interpolar region. 4. New small right ovarian lesion, likely a hemorrhagic cyst. Follow-up pelvic ultrasound is recommended in 3-6 months to ensure the regression of this finding.  2023 upper EUS EGD impression:  - No gross lesions in the majority of the esophagus. - LA Grade A esophagitis with no bleeding found distally and at GE junction. - Z-line irregular, 36 cm from the incisors. - Gastritis. Biopsied. - Duodenitis. Biopsied. - Normal major papilla.   EUS impression:  - The pancreatic duct within the body and tail regions had a dilated endosonographic appearance, had intraductal stone, had a prominently branched endosonographic appearance, had a tortuous/ectatic appearance and had hyperechoic walls. The  pancreas duct within the head and neck are normal in appearance and size. - Pancreatic parenchymal abnormalities consisting of lobularity and hyperechoic strands were noted in the pancreatic head, genu of the pancreas and uncinate process of the pancreas. There was atrophy in the upstream pancreatic body/tail area from the aforementioned stone. - There was no sign of significant pathology in the common bile duct and in the common hepatic duct. - One enlarged lymph node was visualized in the porta hepatis region. Tissue has not been obtained. However, the endosonographic appearance is consistent with benign inflammatory changes.   Past Medical History:  Diagnosis Date   Arthritis    Elevated serum creatinine 02/19/2021   Fibroids    Herpes simplex virus (HSV) infection    no OB +HSV 05/04/16 at [facility]    Hypertension    Hypothyroidism    Pancreatitis    Vaginal Pap smear, abnormal     Past Surgical History:  Procedure Laterality Date   BIOPSY  03/31/2022   Procedure: BIOPSY;  Surgeon: Wilhelmenia Aloha Raddle., MD;  Location: [facility];  Service: Gastroenterology;;   CHOLECYSTECTOMY N/A 04/13/2020   Procedure: LAPAROSCOPIC CHOLECYSTECTOMY WITH INTRAOPERATIVE CHOLANGIOGRAM;  Surgeon: Eletha Boas, MD;  Location: [facility];  Service: General;  Laterality: N/A;   ESOPHAGOGASTRODUODENOSCOPY (EGD) WITH PROPOFOL  N/A 03/31/2022   Procedure: ESOPHAGOGASTRODUODENOSCOPY (EGD) WITH PROPOFOL ;  Surgeon: Wilhelmenia Aloha Raddle., MD;  Location: [facility];  Service: Gastroenterology;  Laterality: N/A;   EUS N/A 03/31/2022   Procedure: UPPER ENDOSCOPIC ULTRASOUND (EUS) LINEAR;  Surgeon: Wilhelmenia Aloha Raddle., MD;  Location: [facility];  Service: Gastroenterology;  Laterality: N/A;   KNEE SURGERY Left    PARATHYROIDECTOMY Right 12/15/2023   Procedure: PARATHYROIDECTOMY;  Surgeon: Eletha Boas, MD;  Location: [facility];  Service: General;  Laterality: Right;  TONSILLECTOMY      Current Outpatient  Medications  Medication Sig Dispense Refill   amLODipine  (NORVASC ) 10 MG tablet Take 1 tablet (10 mg total) by mouth daily. 90 tablet 2   hyoscyamine  (LEVSIN  SL) 0.125 MG SL tablet Place 1 tablet (0.125 mg total) under the tongue every 8 (eight) hours as needed. 30 tablet 0   losartan  (COZAAR ) 100 MG tablet Take 1 tablet (100 mg total) by mouth daily. 90 tablet 2   No current facility-administered medications for this visit.    Allergies as of 05/17/2024 - Review Complete 05/17/2024  Allergen Reaction Noted   Carvedilol   12/14/2023    Family History  Problem Relation Age of Onset   Congestive Heart Failure Maternal Grandmother    Hypertension Maternal Grandmother    Colon polyps Maternal Grandmother    Kidney disease Maternal Grandmother    Other Maternal Grandfather        aneursym   Kidney disease Maternal Grandfather        on dialysis   Congestive Heart Failure Maternal Grandfather    Colon polyps Maternal Grandfather    Diabetes Neg Hx    Pancreatic cancer Neg Hx    Stomach cancer Neg Hx    Colon cancer Neg Hx    Esophageal cancer Neg Hx    Inflammatory bowel disease Neg Hx    Liver disease Neg Hx    Rectal cancer Neg Hx     Social History[1]   Review of Systems:    Constitutional: No weight loss, fever, chills Eyes: No change in vision Cardiovascular: No chest pain, chest pressure or palpitations   Respiratory: No SOB  Gastrointestinal: See HPI and otherwise negative Neurological: No headache, dizziness or syncope   Physical Exam:  Vital signs: BP (!) 186/110 (BP Location: Left Arm, Patient Position: Sitting, Cuff Size: Large)   Pulse 76   Ht 5' 0.75 (1.543 m)   Wt 239 lb (108.4 kg)   LMP 05/13/2024   BMI 45.53 kg/m   Wt Readings from Last 3 Encounters:  05/17/24 239 lb (108.4 kg)  02/06/24 226 lb 1.9 oz (102.6 kg)  12/27/23 230 lb (104.3 kg)     Constitutional: Pleasant, obese female in NAD, alert and cooperative Head:  Normocephalic and  atraumatic.  Respiratory: Respirations even and unlabored. Lungs clear to auscultation bilaterally.  No wheezes, crackles, or rhonchi.  Cardiovascular:  Regular rate and rhythm. No murmurs. No peripheral edema. Gastrointestinal:  Soft, nondistended, nontender. No rebound or guarding. Normal bowel sounds. No appreciable masses or hepatomegaly. Rectal:  Not performed.  Neurologic:  Alert and oriented x4;  grossly normal neurologically.  Skin:   Dry and intact without significant lesions or rashes. Psychiatric: Oriented to person, place and time. Demonstrates good judgement and reason without abnormal affect or behaviors.   Assessment/Plan:   Assessment & Plan Chronic pancreatitis Pancreatic duct dilation Intermittent abdominal pain Loose stools Experiences weekly postprandial abdominal pain and intermittent cramping.  Symptoms unchanged since last office visit in May.  Did not try Levsin .  Has not followed up with primary GI regarding possibility of IBS contributing to symptoms.  Symptoms not necessarily similar to prior episodes of pancreatitis.  Pain sometimes relieved with a bowel movement but not always.   Imaging shows progression of pancreatic duct dilation.  She is interested in moving forward with repeat upper EUS.  - Labs today: CBC, CMP, lipase - Prescribed Levsin  as needed for abdominal cramps and spasms. - Will work on scheduling endoscopic  ultrasound to evaluate pancreatic duct and assess for progression or need for intervention.  To discuss further with Dr. Wilhelmenia. - Provided dietary counseling to avoid high-fat and greasy foods. - Repeat imaging deferred as her symptoms have not progressed since last MRI. - Advised to follow-up with primary GI for further workup of ongoing abdominal pain.  Essential hypertension Blood pressure elevated likely due to missed antihypertensive dose. Denies symptoms of end-organ damage including chest pain, dyspnea, neurologic symptoms, visual  changes, severe headache. Appears well. Regular heart rate and rhythm. Has had similarly elevated BP in the past on chart review. Denies history of MI/stroke.  - Instructed her to immediately resume her antihypertensive medications as prescribed. - Advised to seek emergency care if she develops chest pain, dyspnea, headache, or visual changes, or if blood pressure remains elevated. Patient expressed understanding.  - Follow up with PCP   Camie Furbish, PA-C [facility] 05/17/2024, 4:15 PM  Patient Care Team: Bevely Doffing, FNP as PCP - General (Family Medicine) Cindie Carlin POUR, DO as Consulting Physician (Gastroenterology)       [1]  Social History Tobacco Use   Smoking status: Some Days    Current packs/day: 0.00    Average packs/day: 0.3 packs/day for 17.0 years (4.3 ttl pk-yrs)    Types: Cigarettes    Start date: 01/09/2005    Last attempt to quit: 01/09/2022    Years since quitting: 2.3   Smokeless tobacco: Never   Tobacco comments:    smokes 2-3 cig daily  Vaping Use   Vaping status: Never Used  Substance Use Topics   Alcohol use: Yes    Comment: 1-2 times a month   Drug use: Not Currently    Types: Marijuana    Comment: occasionally   "

## 2024-05-17 NOTE — Patient Instructions (Addendum)
 Please go to the lab in the basement of our building to have lab work done as you leave today. Hit B for basement when you get on the elevator.  When the doors open the lab is on your left.  We will call you with the results. Thank you.  FOLLOW UP WITH YOUR PRIMARY GI FOR ONGOING SYMPTOMS  We have sent the following medications to your pharmacy for you to pick up at your convenience: LEVSIN  0.125 mg  WE WILL WORK ON SCHEDULING YOUR ENDOSCOPIC ULTRASOUND  _______________________________________________________  If your blood pressure at your visit was 140/90 or greater, please contact your primary care physician to follow up on this.  _______________________________________________________  If you are age 44 or older, your body mass index should be between 23-30. Your Body mass index is 45.53 kg/m. If this is out of the aforementioned range listed, please consider follow up with your Primary Care Provider.  If you are age 5 or younger, your body mass index should be between 19-25. Your Body mass index is 45.53 kg/m. If this is out of the aformentioned range listed, please consider follow up with your Primary Care Provider.   ________________________________________________________  The [facility] providers would like to encourage you to use MYCHART to communicate with providers for non-urgent requests or questions.  Due to long hold times on the telephone, sending your provider a message by [facility] may be a faster and more efficient way to get a response.  Please allow 48 business hours for a response.  Please remember that this is for non-urgent requests.  _______________________________________________________  [facility] is using a team-based approach to care.  Your team is made up of your doctor and two to three APPS. Our APPS (Nurse Practitioners and Physician Assistants) work with your physician to ensure care continuity for you. They are fully qualified to address  your health concerns and develop a treatment plan. They communicate directly with your gastroenterologist to care for you. Seeing the Advanced Practice Practitioners on your physician's team can help you by facilitating care more promptly, often allowing for earlier appointments, access to diagnostic testing, procedures, and other specialty referrals.   Due to recent changes in healthcare laws, you may see the results of your imaging and laboratory studies on MyChart before your provider has had a chance to review them.  We understand that in some cases there may be results that are confusing or concerning to you. Not all laboratory results come back in the same time frame and the provider may be waiting for multiple results in order to interpret others.  Please give us  48 hours in order for your provider to thoroughly review all the results before contacting the office for clarification of your results.

## 2024-05-28 ENCOUNTER — Ambulatory Visit: Admitting: Primary Care

## 2024-06-05 ENCOUNTER — Ambulatory Visit
